# Patient Record
Sex: Female | Born: 1965 | Race: Black or African American | Hispanic: No | Marital: Single | Smoking: Current every day smoker
Health system: Southern US, Community
[De-identification: ages and names within clinical notes are randomized; demographics above are authoritative.]

## PROBLEM LIST (undated history)

## (undated) DIAGNOSIS — I10 Essential (primary) hypertension: Secondary | ICD-10-CM

## (undated) DIAGNOSIS — N189 Chronic kidney disease, unspecified: Secondary | ICD-10-CM

## (undated) DIAGNOSIS — D649 Anemia, unspecified: Secondary | ICD-10-CM

## (undated) HISTORY — PX: NO PAST SURGERIES: SHX2092

---

## 1998-01-28 ENCOUNTER — Emergency Department (HOSPITAL_COMMUNITY): Admission: EM | Admit: 1998-01-28 | Discharge: 1998-01-28 | Payer: Self-pay | Admitting: Emergency Medicine

## 1998-03-09 ENCOUNTER — Emergency Department (HOSPITAL_COMMUNITY): Admission: EM | Admit: 1998-03-09 | Discharge: 1998-03-09 | Payer: Self-pay | Admitting: Emergency Medicine

## 1999-08-24 ENCOUNTER — Other Ambulatory Visit: Admission: RE | Admit: 1999-08-24 | Discharge: 1999-08-24 | Payer: Self-pay | Admitting: Family Medicine

## 2000-09-07 ENCOUNTER — Other Ambulatory Visit: Admission: RE | Admit: 2000-09-07 | Discharge: 2000-09-07 | Payer: Self-pay | Admitting: Family Medicine

## 2001-11-22 ENCOUNTER — Other Ambulatory Visit: Admission: RE | Admit: 2001-11-22 | Discharge: 2001-11-22 | Payer: Self-pay | Admitting: Family Medicine

## 2002-10-07 ENCOUNTER — Emergency Department (HOSPITAL_COMMUNITY): Admission: EM | Admit: 2002-10-07 | Discharge: 2002-10-07 | Payer: Self-pay | Admitting: Emergency Medicine

## 2002-11-27 ENCOUNTER — Emergency Department (HOSPITAL_COMMUNITY): Admission: EM | Admit: 2002-11-27 | Discharge: 2002-11-27 | Payer: Self-pay | Admitting: Emergency Medicine

## 2002-12-05 ENCOUNTER — Other Ambulatory Visit: Admission: RE | Admit: 2002-12-05 | Discharge: 2002-12-05 | Payer: Self-pay | Admitting: Family Medicine

## 2004-05-13 ENCOUNTER — Other Ambulatory Visit: Admission: RE | Admit: 2004-05-13 | Discharge: 2004-05-13 | Payer: Self-pay | Admitting: Physician Assistant

## 2005-08-01 ENCOUNTER — Other Ambulatory Visit: Admission: RE | Admit: 2005-08-01 | Discharge: 2005-08-01 | Payer: Self-pay | Admitting: Family Medicine

## 2005-10-10 ENCOUNTER — Other Ambulatory Visit: Admission: RE | Admit: 2005-10-10 | Discharge: 2005-10-10 | Payer: Self-pay | Admitting: Family Medicine

## 2005-12-29 ENCOUNTER — Emergency Department (HOSPITAL_COMMUNITY): Admission: EM | Admit: 2005-12-29 | Discharge: 2005-12-29 | Payer: Self-pay | Admitting: Emergency Medicine

## 2008-01-23 ENCOUNTER — Other Ambulatory Visit: Admission: RE | Admit: 2008-01-23 | Discharge: 2008-01-23 | Payer: Self-pay | Admitting: Family Medicine

## 2011-05-30 ENCOUNTER — Other Ambulatory Visit (HOSPITAL_COMMUNITY)
Admission: RE | Admit: 2011-05-30 | Discharge: 2011-05-30 | Disposition: A | Discharge status: Home / Self Care | Payer: 59 | Source: Ambulatory Visit | Attending: Family Medicine | Admitting: Family Medicine

## 2011-05-30 ENCOUNTER — Other Ambulatory Visit: Payer: Self-pay | Admitting: Family Medicine

## 2011-05-30 DIAGNOSIS — Z124 Encounter for screening for malignant neoplasm of cervix: Secondary | ICD-10-CM | POA: Insufficient documentation

## 2011-05-30 DIAGNOSIS — Z1159 Encounter for screening for other viral diseases: Secondary | ICD-10-CM | POA: Insufficient documentation

## 2012-02-04 ENCOUNTER — Emergency Department (HOSPITAL_COMMUNITY)
Admission: EM | Admit: 2012-02-04 | Discharge: 2012-02-05 | Disposition: A | Discharge status: Home / Self Care | Payer: BC Managed Care – PPO | Attending: Emergency Medicine | Admitting: Emergency Medicine

## 2012-02-04 ENCOUNTER — Encounter (HOSPITAL_COMMUNITY): Payer: Self-pay

## 2012-02-04 DIAGNOSIS — S0100XA Unspecified open wound of scalp, initial encounter: Secondary | ICD-10-CM | POA: Insufficient documentation

## 2012-02-04 DIAGNOSIS — Y92009 Unspecified place in unspecified non-institutional (private) residence as the place of occurrence of the external cause: Secondary | ICD-10-CM | POA: Insufficient documentation

## 2012-02-04 DIAGNOSIS — W1809XA Striking against other object with subsequent fall, initial encounter: Secondary | ICD-10-CM | POA: Insufficient documentation

## 2012-02-04 DIAGNOSIS — S0191XA Laceration without foreign body of unspecified part of head, initial encounter: Secondary | ICD-10-CM

## 2012-02-04 DIAGNOSIS — I10 Essential (primary) hypertension: Secondary | ICD-10-CM | POA: Insufficient documentation

## 2012-02-04 HISTORY — DX: Essential (primary) hypertension: I10

## 2012-02-04 MED ORDER — LIDOCAINE-EPINEPHRINE (PF) 2 %-1:200000 IJ SOLN
10.0000 mL | Freq: Once | INTRAMUSCULAR | Status: AC
  Administered 2012-02-04: 10 mL

## 2012-02-04 MED ORDER — DICLOFENAC SODIUM 75 MG PO TBEC: 75.0000 mg | DELAYED_RELEASE_TABLET | Freq: Two times a day (BID) | ORAL | Status: AC

## 2012-02-04 NOTE — ED Notes (Signed)
Per EMS pt was "hoarse playing in hotel room and struck head on night stand" No LOC noted. Bleeding controlled before ems arrival. Pt alert x3.

## 2012-02-04 NOTE — ED Provider Notes (Signed)
History     CSN: [id number]  Arrival date & time 02/04/12  2042   First MD Initiated Contact with Patient 02/04/12 2205     10:28 PM HPI Reports she was playing around at home when she fell back and hit her head on a table. States she has a large bleeding laceration on her head. Denies LOC, numbness, tingling, weakness, AMS. Spouse reports she is behaving normally. Denies ETOH, drug abuse, neck pain, visual change dizziness, n/v.  Patient is a 46 y.o. female presenting with scalp laceration. The history is provided by the patient.  Head Laceration This is a new problem. The current episode started today. The problem has been unchanged. Pertinent negatives include no headaches, nausea, neck pain, numbness, vomiting or weakness. Exacerbated by: palpation. Treatments tried: pressure. The treatment provided mild relief.    Past Medical History  Diagnosis Date  . Hypertension     No past surgical history on file.  Family History  Problem Relation Age of Onset  . Diabetes Mother     History  Substance Use Topics  . Smoking status: Current Everyday Smoker  . Smokeless tobacco: Not on file  . Alcohol Use: Yes     1 every six months    OB History    Grav Para Term Preterm Abortions TAB SAB Ect Mult Living   1 1              Review of Systems  HENT: Negative for neck pain.   Gastrointestinal: Negative for nausea and vomiting.  Skin:       Laceration  Neurological: Negative for dizziness, weakness, light-headedness, numbness and headaches.  All other systems reviewed and are negative.    Allergies  Review of patient's allergies indicates no known allergies.  Home Medications  No current outpatient prescriptions on file.  BP 160/100  Pulse 94  Temp(Src) 98.8 F (37.1 C) (Oral)  Resp 18  Ht 5' (1.524 m)  Wt 197 lb (89.359 kg)  BMI 38.47 kg/m2  SpO2 100%  LMP 02/01/2012  Physical Exam  Vitals reviewed. Constitutional: She is oriented to person, place, and time.  Vital signs are normal. She appears well-developed and well-nourished. No distress.  HENT:  Head: Normocephalic. Head is with laceration.    Eyes: Conjunctivae are normal. Pupils are equal, round, and reactive to light.  Neck: Normal range of motion. Neck supple.  Pulmonary/Chest: Effort normal.  Neurological: She is alert and oriented to person, place, and time. No cranial nerve deficit (tested CN III-XII) or sensory deficit.  Skin: Skin is warm and dry. No rash noted. No erythema. No pallor.       Large 4cm head laceration on posterior scalp  Psychiatric: She has a normal mood and affect. Her behavior is normal.    ED Course  Procedures  LACERATION REPAIR Performed by: Sheliah Mends Authorized by: Sheliah Mends Consent: Verbal consent obtained. Risks and benefits: risks, benefits and alternatives were discussed Consent given by: patient Patient identity confirmed: provided demographic data Prepped and Draped in normal sterile fashion Wound explored  Laceration Location: right posterior scalp   Laceration Length: 4cm  No Foreign Bodies seen or palpated  Anesthesia: local infiltration  Local anesthetic: lidocaine 2% 2 epinephrine  Anesthetic total: 4 ml  Irrigation method: syringe Amount of cleaning: standard  Skin closure: simple  Number of staples 8  Technique: Stapling  Patient tolerance: Patient tolerated the procedure well with no immediate complications.    MDM  Sheliah Mends, PA-C 02/05/12 [phone]
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
## 2012-02-05 NOTE — ED Provider Notes (Signed)
Medical screening examination/treatment/procedure(s) were performed by non-physician practitioner and as supervising physician I was immediately available for consultation/collaboration.  Leota Jacobsen, MD 02/05/12 1044

## 2012-02-05 NOTE — ED Notes (Signed)
Wound well approximated with staples.

## 2012-02-09 ENCOUNTER — Emergency Department (HOSPITAL_COMMUNITY)
Admission: EM | Admit: 2012-02-09 | Discharge: 2012-02-09 | Disposition: A | Discharge status: Home / Self Care | Payer: BC Managed Care – PPO | Attending: Emergency Medicine | Admitting: Emergency Medicine

## 2012-02-09 ENCOUNTER — Encounter (HOSPITAL_COMMUNITY): Payer: Self-pay | Admitting: *Deleted

## 2012-02-09 DIAGNOSIS — I10 Essential (primary) hypertension: Secondary | ICD-10-CM | POA: Insufficient documentation

## 2012-02-09 DIAGNOSIS — Z5189 Encounter for other specified aftercare: Secondary | ICD-10-CM

## 2012-02-09 DIAGNOSIS — Z09 Encounter for follow-up examination after completed treatment for conditions other than malignant neoplasm: Secondary | ICD-10-CM | POA: Insufficient documentation

## 2012-02-09 NOTE — Discharge Instructions (Signed)
Your staples do not appear ready for removal today. Please return in 2-3 days for a recheck. Continue to apply antibacterial ointment to the area daily. Wash the area gently with a cloth. Return sooner if you develop fever, chills, nausea, vomiting, or any other worrisome symptoms.  Staple Wound Closure Your wound has been cleaned and closed with skin staples. Please keep the area around your staples clean and dry. Rest and elevate the injured part until all the pain and swelling are gone.  See your doctor or return here for re-check of wound in 2 days.   See your doctor or return here to have your staples removed as instructed.   Please watch for signs of wound infection:   Unusual redness or swelling around the wound.   Increasing pain or tenderness.   Pus drainage.  See your doctor or return here at once if you think your wound is infected. As the wound heals, you may leave it open to the air and clean it daily with a solution of half water/half peroxide. Do not soak your wound in water for long periods. If your caregiver advised you to do so, apply antibiotic cream or ointment. Cover with a new bandage or dressing.  You might need a tetanus shot now if:  You have no idea when you had the last one.   You have never had a tetanus shot before.   Your wound had dirt in it.   If you need a tetanus shot, and you decide not to get one, there is a rare chance of getting tetanus. Sickness from tetanus can be serious.  If you got a tetanus shot, your arm may swell and get red and warm to the touch at the shot site. This is common and not a problem. Document Released: 09/19/2005 Document Revised: 09/08/2011 Document Reviewed: 03/16/2007 [facility] Patient Information 2012 [place].

## 2012-02-09 NOTE — ED Notes (Signed)
Pt here for staple removal to back of head

## 2012-02-09 NOTE — ED Provider Notes (Signed)
History     CSN: [id number]  Arrival date & time 02/09/12  1449   First MD Initiated Contact with Patient 02/09/12 1553      Chief Complaint  Patient presents with  . Suture / Staple Removal    (Consider location/radiation/quality/duration/timing/severity/associated sxs/prior treatment) Patient is a 46 y.o. female presenting with wound check. The history is provided by the patient.  Wound Check  She was treated in the ED 3 to 5 days ago. Previous treatment in the ED includes laceration repair and wound cleansing or irrigation. Treatments since wound repair include regular soap and water washings and antibiotic ointment use. There has been no drainage from the wound. There is no redness present. There is no swelling present. The pain has no pain.   Pt was seen in ED on night of 5/5 for staple placement to a scalp lac. Has had no fever, chills. No drainage from the area.  Past Medical History  Diagnosis Date  . Hypertension     History reviewed. No pertinent past surgical history.  Family History  Problem Relation Age of Onset  . Diabetes Mother     History  Substance Use Topics  . Smoking status: Current Everyday Smoker -- 0.0 packs/day  . Smokeless tobacco: Not on file  . Alcohol Use: Yes     1 every six months    OB History    Grav Para Term Preterm Abortions TAB SAB Ect Mult Living   1 1              Review of Systems as per HPI  Allergies  Review of patient's allergies indicates no known allergies.  Home Medications   Current Outpatient Rx  Name Route Sig Dispense Refill  . DICLOFENAC SODIUM 75 MG PO TBEC Oral Take 1 tablet (75 mg total) by mouth 2 (two) times daily. 30 tablet 0    BP 140/98  Pulse 106  Temp(Src) 98.8 F (37.1 C) (Oral)  Resp 20  Ht 5' (1.524 m)  Wt 193 lb (87.544 kg)  BMI 37.69 kg/m2  SpO2 94%  LMP 01/25/2012  Physical Exam  Nursing note and vitals reviewed. Constitutional: She appears well-developed and well-nourished. No  distress.  HENT:  Head: Normocephalic.       8 staples in place to posterior scalp. Small amount of serosanguinous drainage noted to lateral aspect of wound. Wound edges approximated but not well healed at this time. No surrounding erythema, edema. Wound nontender to palpation.  Neck: Normal range of motion.  Cardiovascular: Normal rate.   Pulmonary/Chest: Effort normal.  Musculoskeletal: Normal range of motion.  Neurological: She is alert.  Skin: Skin is warm and dry. She is not diaphoretic.  Psychiatric: She has a normal mood and affect.    ED Course  Procedures (including critical care time)  Labs Reviewed - No data to display No results found.   1. Visit for wound check       MDM  Pt presents for wound check - staples do not appear ready for removal yet. Will have her return in 2-3 days for recheck. Instructed to cont wound care measures. Reasons to return earlier discussed.        Abran Richard, [place] 02/09/12 [phone]

## 2012-02-12 NOTE — ED Provider Notes (Signed)
Medical screening examination/treatment/procedure(s) were performed by non-physician practitioner and as supervising physician I was immediately available for consultation/collaboration.  Barbara Cower, MD 02/12/12 1109

## 2012-02-13 ENCOUNTER — Emergency Department (HOSPITAL_COMMUNITY)
Admission: EM | Admit: 2012-02-13 | Discharge: 2012-02-13 | Disposition: A | Discharge status: Home / Self Care | Payer: BC Managed Care – PPO | Source: Home / Self Care | Attending: Emergency Medicine | Admitting: Emergency Medicine

## 2012-02-13 ENCOUNTER — Encounter (HOSPITAL_COMMUNITY): Payer: Self-pay

## 2012-02-13 DIAGNOSIS — Z4802 Encounter for removal of sutures: Secondary | ICD-10-CM

## 2012-02-13 NOTE — ED Notes (Signed)
Pt seen at [facility] ED on 02/04/12 for repair of laceration to post. scalp with staples.  Here today for staple removal.  8 staples intact to rt post scalp- edges approximated, no redness, swelling or drainage noted.  Tetanus less than 5 years.

## 2012-02-13 NOTE — Discharge Instructions (Signed)
Continue the bacitracin and warm compresses. Do not vigorously scrub the wound as it is still in the process of healing. Return if it starts beelding, if you have any signs of infection, or other concerns.

## 2012-02-13 NOTE — ED Provider Notes (Signed)
History     CSN: [id number]  Arrival date & time 02/13/12  1012   First MD Initiated Contact with Patient 02/13/12 1043      Chief Complaint  Patient presents with  . Suture / Staple Removal    (Consider location/radiation/quality/duration/timing/severity/associated sxs/prior treatment) HPI Comments: Patient sustained laceration to the back of her head on 5/4. 8 staples placed. She was seen on 5/9, staples were no thought ready for removal. Patient states they "itch" but otherwise has no complaints  Patient is a 46 y.o. female presenting with suture removal. The history is provided by the patient. No language interpreter was used.  Suture / Staple Removal  The sutures were placed 7 to 10 days ago. Treatments since wound repair include soaks and antibiotic ointment use. There has been no drainage from the wound. There is no redness present. There is no swelling present. The pain has no pain.    Past Medical History  Diagnosis Date  . Hypertension     History reviewed. No pertinent past surgical history.  Family History  Problem Relation Age of Onset  . Diabetes Mother     History  Substance Use Topics  . Smoking status: Current Everyday Smoker -- 0.0 packs/day  . Smokeless tobacco: Not on file  . Alcohol Use: Yes     1 every six months    OB History    Grav Para Term Preterm Abortions TAB SAB Ect Mult Living   1 1              Review of Systems  Allergies  Review of patient's allergies indicates no known allergies.  Home Medications   Current Outpatient Rx  Name Route Sig Dispense Refill  . DICLOFENAC SODIUM 75 MG PO TBEC Oral Take 1 tablet (75 mg total) by mouth 2 (two) times daily. 30 tablet 0    BP 132/92  Pulse 96  Temp(Src) 98.5 F (36.9 C) (Oral)  Resp 20  SpO2 99%  LMP 01/25/2012  Physical Exam  Nursing note and vitals reviewed. Constitutional: She is oriented to person, place, and time. She appears well-developed and well-nourished. No  distress.  HENT:  Head: Normocephalic and atraumatic.         Healing laceration, 8 staples intact. No erythema, tenderness, expressible purulent drainage  Eyes: Conjunctivae and EOM are normal.  Neck: Normal range of motion.  Cardiovascular: Normal rate.   Pulmonary/Chest: Effort normal.  Abdominal: She exhibits no distension.  Musculoskeletal: Normal range of motion.  Neurological: She is alert and oriented to person, place, and time.  Skin: Skin is warm and dry.  Psychiatric: She has a normal mood and affect. Her behavior is normal. Judgment and thought content normal.    ED Course  SUTURE REMOVAL Date/Time: 02/13/2012 11:22 AM Performed by: Cherly Beach Authorized by: Cherly Beach Consent: Verbal consent obtained. Risks and benefits: risks, benefits and alternatives were discussed Consent given by: patient Patient understanding: patient states understanding of the procedure being performed Patient consent: the patient's understanding of the procedure matches consent given Required items: required blood products, implants, devices, and special equipment available Patient identity confirmed: verbally with patient Time out: Immediately prior to procedure a "time out" was called to verify the correct patient, procedure, equipment, support staff and site/side marked as required. Body area: head/neck Location details: scalp Wound Appearance: clean Staples Removed: 8 Patient tolerance: Patient tolerated the procedure well with no immediate complications.   (including critical care time)  Labs Reviewed -  No data to display No results found.   1. Encounter for staple removal       MDM  Previous records reviewed.   Cherly Beach, MD 02/13/12 1122

## 2013-02-10 ENCOUNTER — Emergency Department (HOSPITAL_COMMUNITY): Payer: BC Managed Care – PPO

## 2013-02-10 ENCOUNTER — Encounter (HOSPITAL_COMMUNITY): Payer: Self-pay | Admitting: *Deleted

## 2013-02-10 ENCOUNTER — Inpatient Hospital Stay (HOSPITAL_COMMUNITY)
Admission: EM | Admit: 2013-02-10 | Discharge: 2013-02-12 | DRG: 089 | Disposition: A | Discharge status: Home / Self Care | Payer: BC Managed Care – PPO | Attending: Internal Medicine | Admitting: Internal Medicine

## 2013-02-10 DIAGNOSIS — E876 Hypokalemia: Secondary | ICD-10-CM

## 2013-02-10 DIAGNOSIS — F17200 Nicotine dependence, unspecified, uncomplicated: Secondary | ICD-10-CM | POA: Diagnosis present

## 2013-02-10 DIAGNOSIS — J209 Acute bronchitis, unspecified: Secondary | ICD-10-CM | POA: Diagnosis present

## 2013-02-10 DIAGNOSIS — I10 Essential (primary) hypertension: Secondary | ICD-10-CM | POA: Diagnosis present

## 2013-02-10 DIAGNOSIS — J189 Pneumonia, unspecified organism: Principal | ICD-10-CM | POA: Diagnosis present

## 2013-02-10 DIAGNOSIS — K209 Esophagitis, unspecified without bleeding: Secondary | ICD-10-CM | POA: Diagnosis present

## 2013-02-10 LAB — COMPREHENSIVE METABOLIC PANEL
ALT: 14 U/L (ref 0–35)
Alkaline Phosphatase: 88 U/L (ref 39–117)
CO2: 23 mEq/L (ref 19–32)
Chloride: 100 mEq/L (ref 96–112)
GFR calc Af Amer: >90 mL/min (ref >90)
GFR calc non Af Amer: >90 mL/min (ref >90)
Glucose, Bld: 128 mg/dL — ABNORMAL HIGH (ref 70–99)
Potassium: 3.3 mEq/L — ABNORMAL LOW (ref 3.5–5.1)
Sodium: 136 mEq/L (ref 135–145)

## 2013-02-10 LAB — CBC
HCT: 39.3 % (ref 36.0–46.0)
Hemoglobin: 14.2 g/dL (ref 12.0–15.0)
WBC: 13.9 10*3/uL — ABNORMAL HIGH (ref 4.0–10.5)

## 2013-02-10 LAB — RAPID URINE DRUG SCREEN, HOSP PERFORMED
Barbiturates: NOT DETECTED
Benzodiazepines: NOT DETECTED
Cocaine: NOT DETECTED

## 2013-02-10 IMAGING — CT CT ANGIO CHEST
1 of 3 series · 19 of 32 positions shown · IV contrast (APPLIED)
Comparison: [DATE].

CLINICAL DATA: Short of breath.

CT ANGIOGRAPHY CHEST
TECHNIQUE: Multidetector CT imaging of the chest using the
standard protocol during bolus administration of intravenous
contrast. Multiplanar reconstructed images including MIPs were
obtained and reviewed to evaluate the vascular anatomy.
Contrast: 80mL OMNIPAQUE IOHEXOL 350 MG/ML SOLN

[Series 6: pulm embolism 1.0 b25f st · axial · 0.63mm/px · z∈[-272,-28]mm · 19 of 272 slices shown]
[im 14/272  lung]
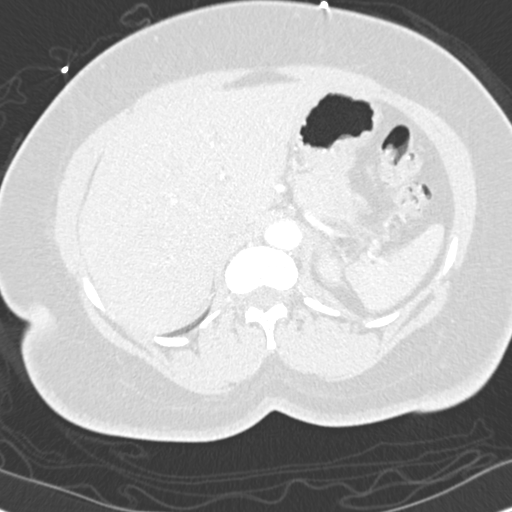
[im 28/272  mediastinal]
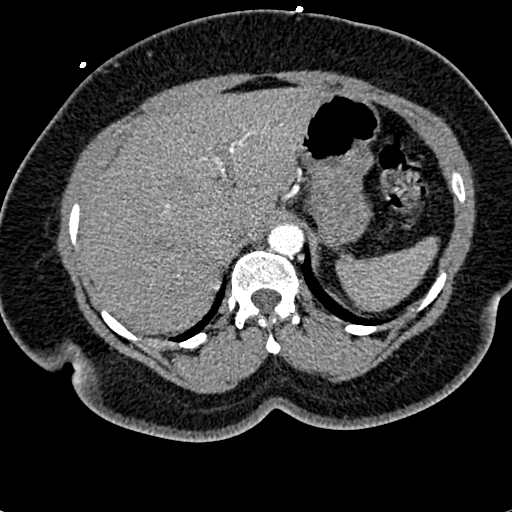
[im 41/272  lung]
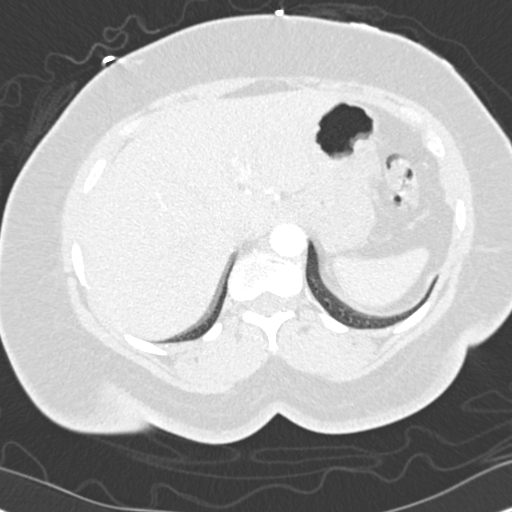
[im 68/272  mediastinal]
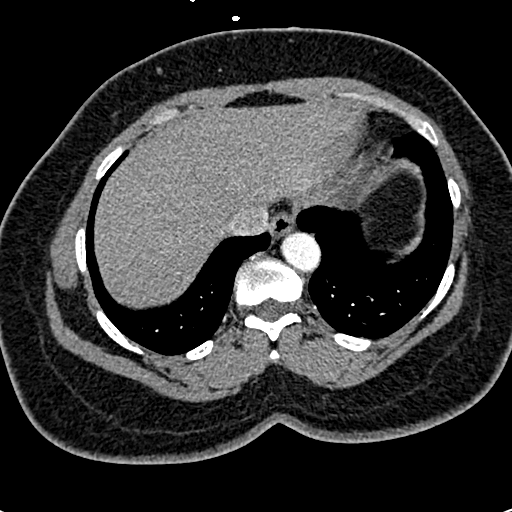
[im 82/272  lung]
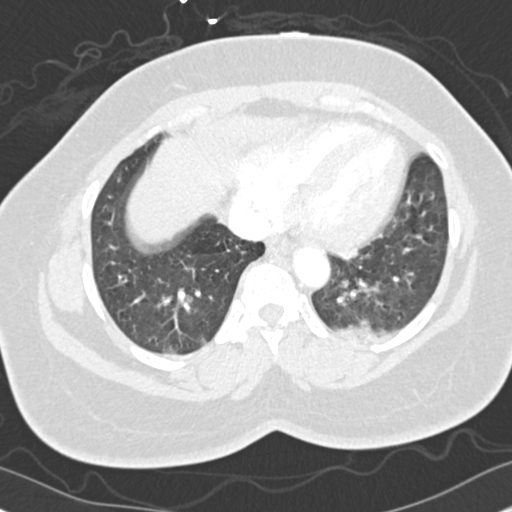
[im 91/272  mediastinal]
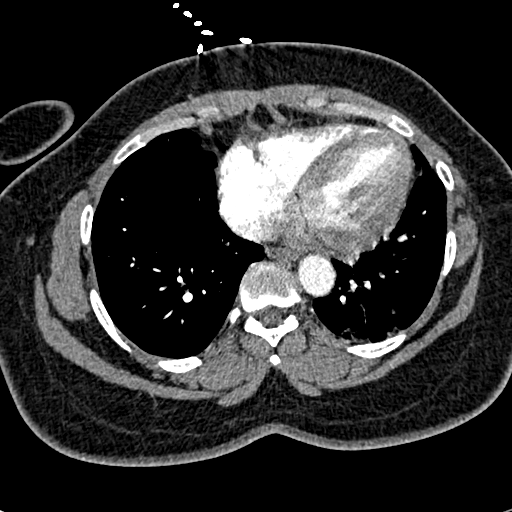
[im 95/272  lung]
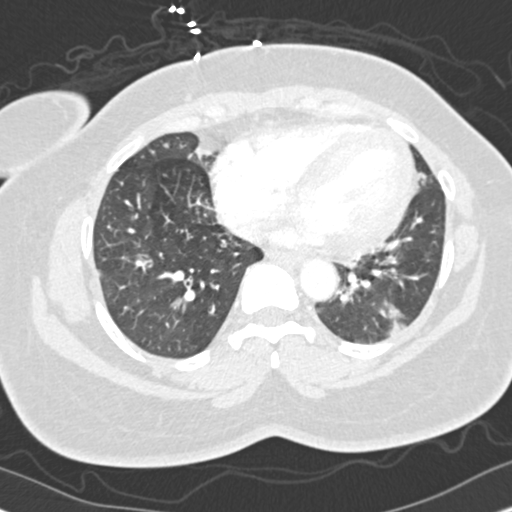
[im 109/272  mediastinal]
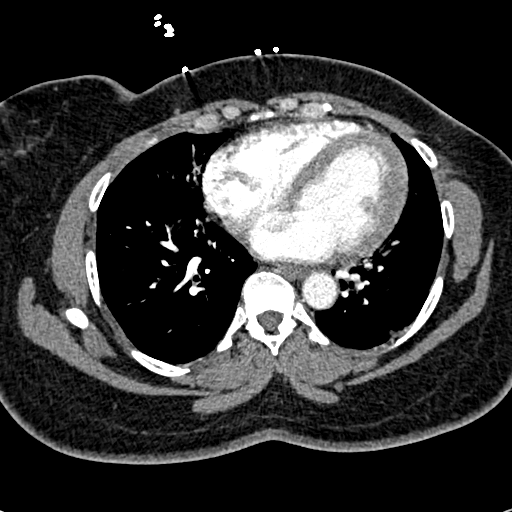
[im 122/272  lung]
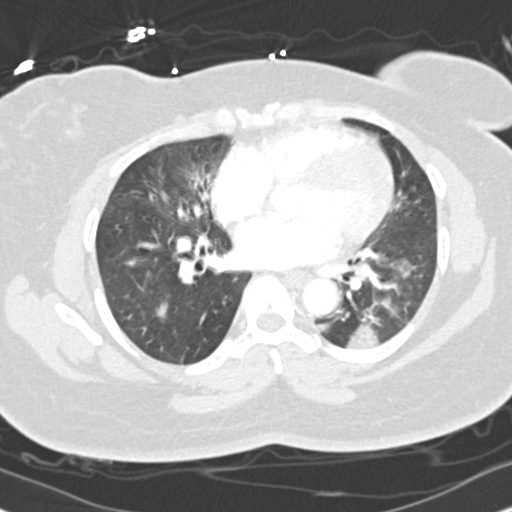
[im 136/272  mediastinal]
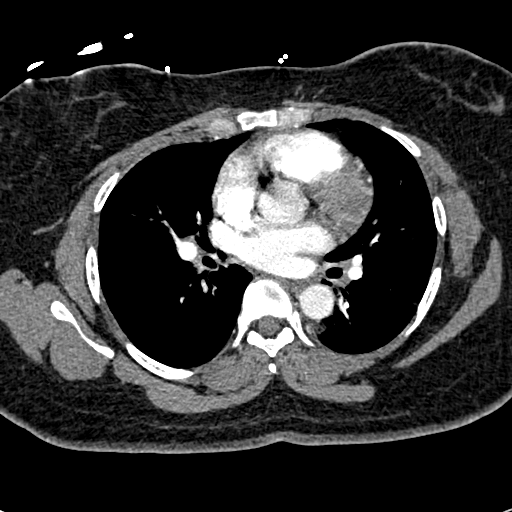
[im 150/272  lung]
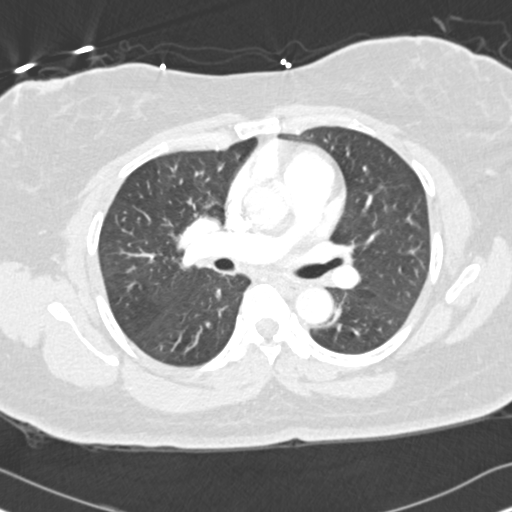
[im 163/272  mediastinal]
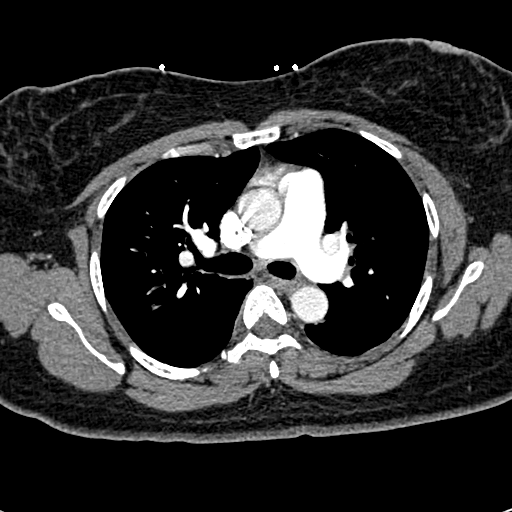
[im 177/272  lung]
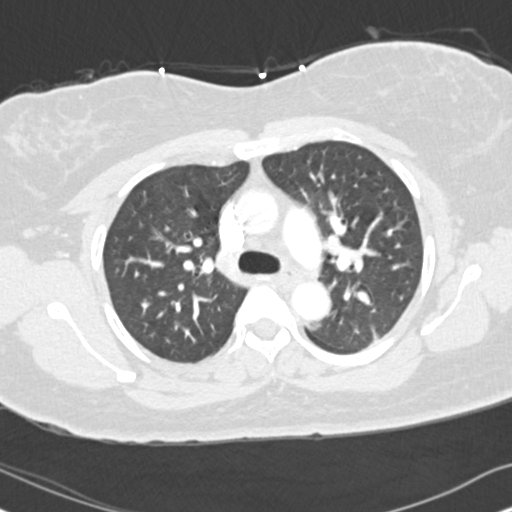
[im 181/272  mediastinal]
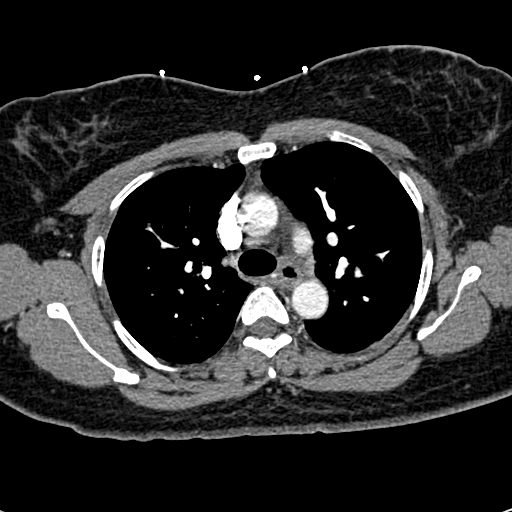
[im 190/272  lung]
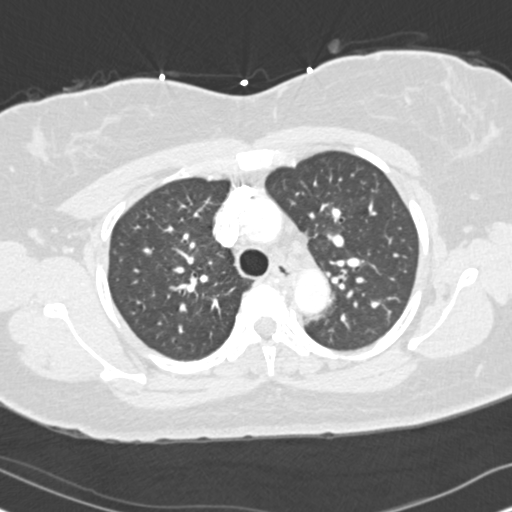
[im 204/272  mediastinal]
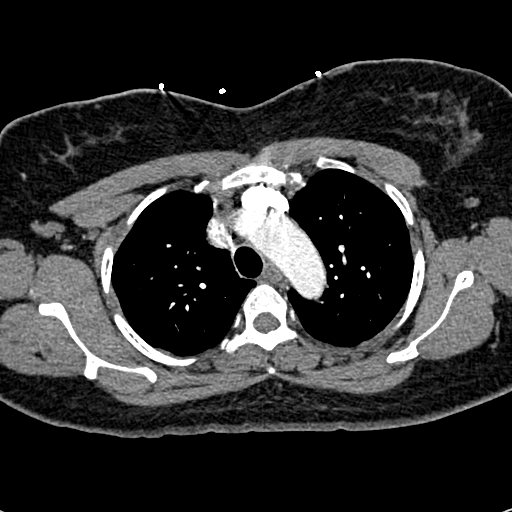
[im 231/272  lung]
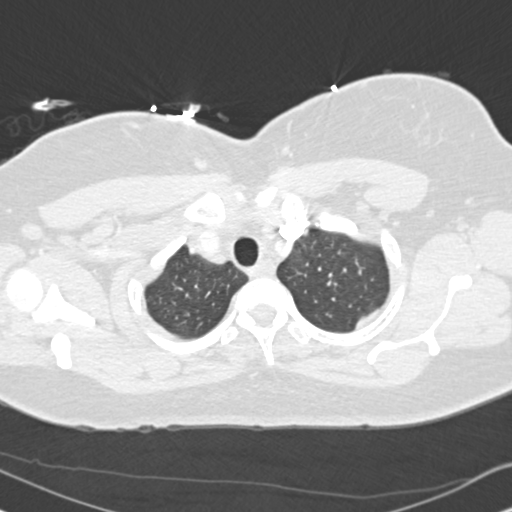
[im 244/272  mediastinal]
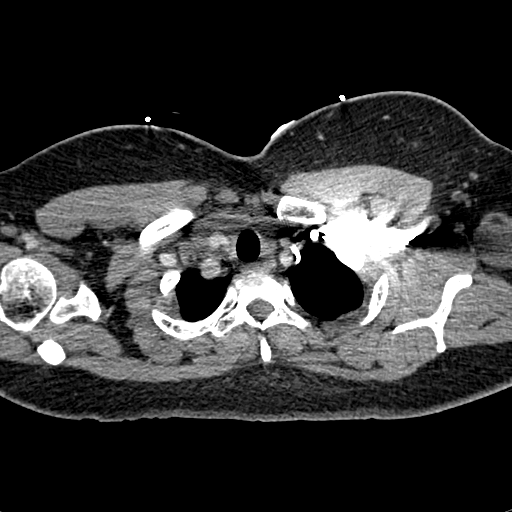
[im 258/272  lung]
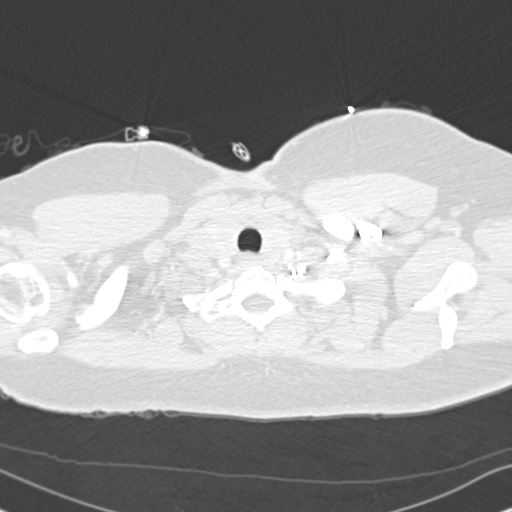

[19 of 32 positions shown; findings below may reference images not displayed]

FINDINGS: Technically adequate study for pulmonary embolism.  No
pulmonary embolus is present.  No axillary adenopathy.  The
thoracic esophagus demonstrates thickening just inferior to the
level of the aortic arch.  This is nonspecific and could be
associated with esophagitis.  Follow-up endoscopy is recommended.

Prominent lymphoid tissue is present in both pulmonary hila.  There
is airspace opacity in the right middle lobe and both lower lobes.
No involvement of the upper lobes.  No convincing evidence of
interlobular septal thickening.  Differential considerations for
airspace disease include multi focal pneumonia or pulmonary edema
primarily.  There is also airspace disease within the lingula.  No
pericardial effusion.  No pleural effusion.  Incidental imaging of
the upper abdomen is within normal limits.  No aggressive osseous
lesions.
IMPRESSION: 1.  Multifocal peripheral airspace opacity compatible with
multifocal pneumonia; pulmonary edema is considered less likely.
2.  Negative for pulmonary embolus or acute aortic abnormality.
3.  Mid thoracic esophageal thickening.  Although nonspecific, this
could be related to esophagitis.  Follow-up endoscopy recommended.

## 2013-02-10 IMAGING — CR DG CHEST 1V PORT
1 series · 1 of 1 positions shown · non-contrast
Comparison: None.

CLINICAL DATA: Short of breath.

PORTABLE CHEST - 1 VIEW

[AP]
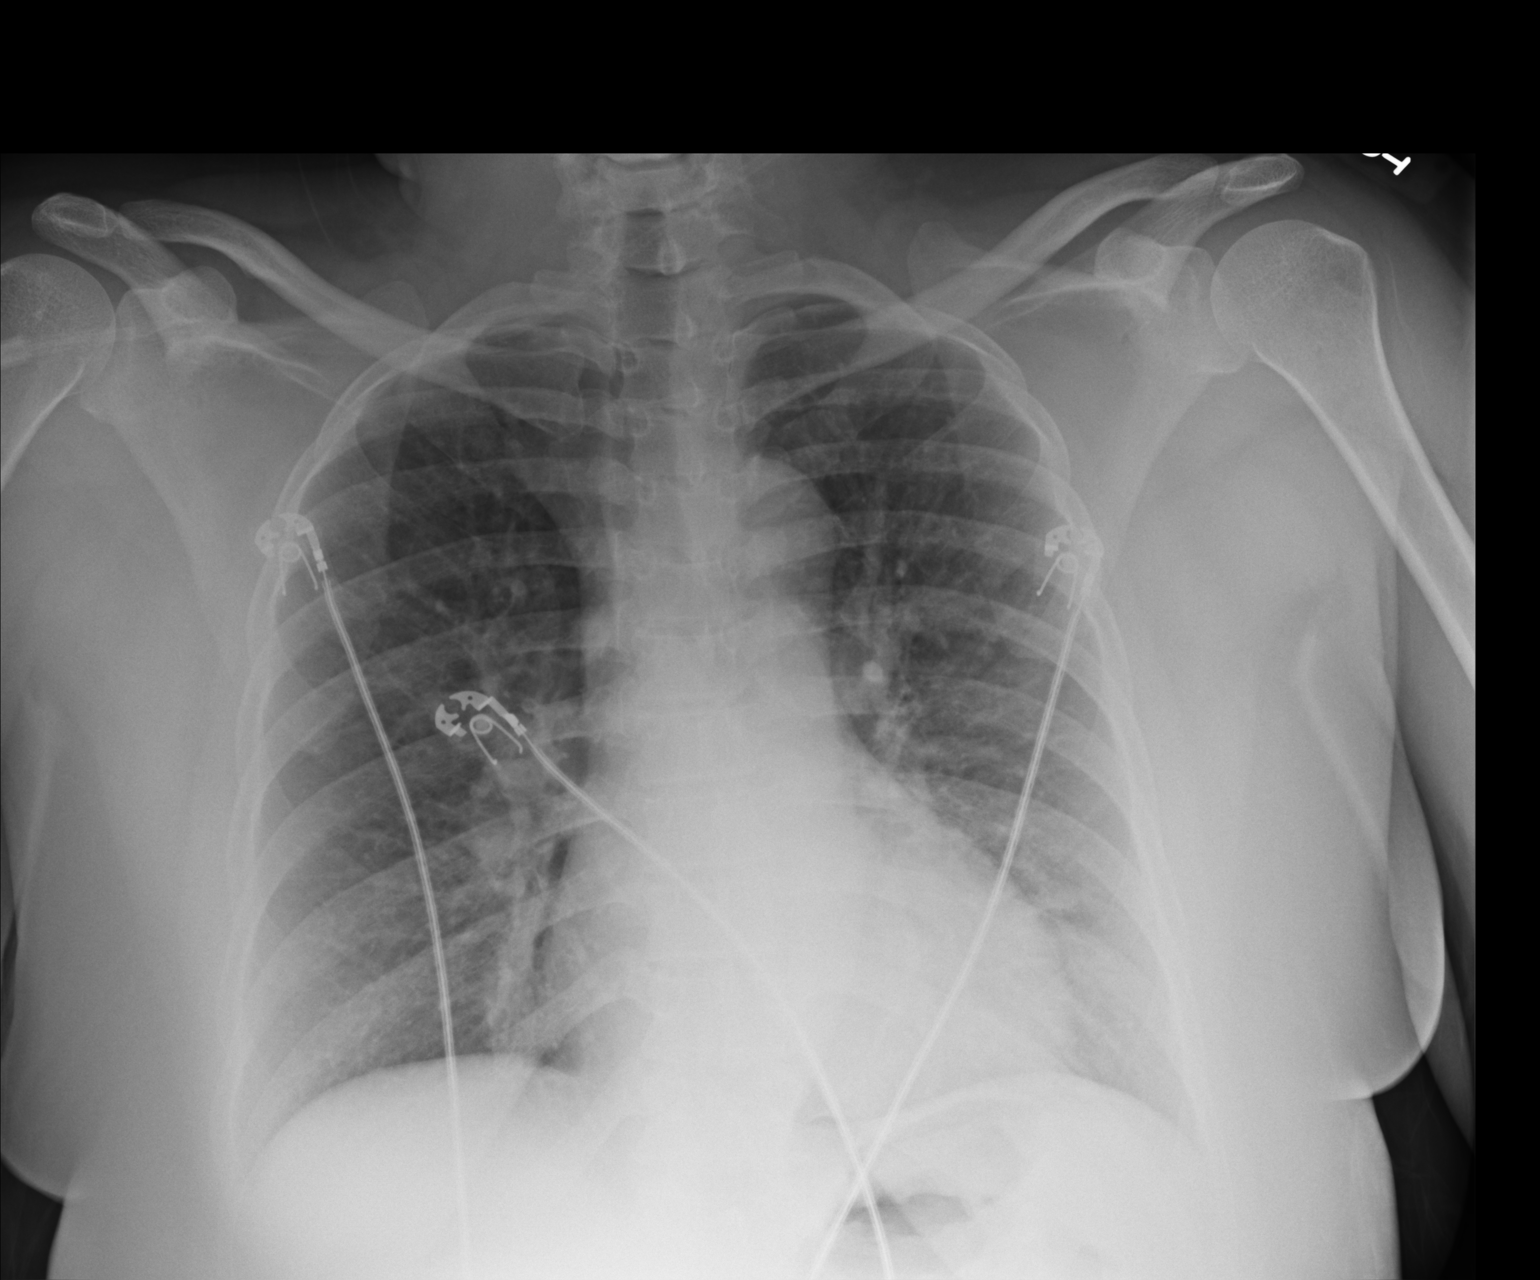

[1 of 1 positions shown; findings below may reference images not displayed]

FINDINGS: Cardiopericardial silhouette within normal limits for
projection.  No airspace disease.  No pleural effusion. Monitoring
leads are projected over the chest.
IMPRESSION: No acute cardiopulmonary disease.

## 2013-02-10 MED ORDER — ENOXAPARIN SODIUM 40 MG/0.4ML ~~LOC~~ SOLN
40.0000 mg | SUBCUTANEOUS | Status: DC
  Administered 2013-02-11 – 2013-02-12 (×2): 40 mg via SUBCUTANEOUS
  Filled 2013-02-10 (×3): qty 0.4

## 2013-02-10 MED ORDER — SODIUM CHLORIDE 0.9 % IJ SOLN
3.0000 mL | Freq: Two times a day (BID) | INTRAMUSCULAR | Status: DC
  Administered 2013-02-10: 3 mL via INTRAVENOUS

## 2013-02-10 MED ORDER — ALBUTEROL SULFATE (5 MG/ML) 0.5% IN NEBU
5.0000 mg | INHALATION_SOLUTION | Freq: Once | RESPIRATORY_TRACT | Status: AC
  Administered 2013-02-10: 5 mg via RESPIRATORY_TRACT
  Filled 2013-02-10: qty 1

## 2013-02-10 MED ORDER — ACETAMINOPHEN 650 MG RE SUPP: 650.0000 mg | Freq: Four times a day (QID) | RECTAL | Status: DC | PRN

## 2013-02-10 MED ORDER — ONDANSETRON HCL 4 MG/2ML IJ SOLN: 4.0000 mg | Freq: Four times a day (QID) | INTRAMUSCULAR | Status: DC | PRN

## 2013-02-10 MED ORDER — ASPIRIN 81 MG PO CHEW
324.0000 mg | CHEWABLE_TABLET | Freq: Once | ORAL | Status: AC
  Administered 2013-02-10: 324 mg via ORAL
  Filled 2013-02-10: qty 4

## 2013-02-10 MED ORDER — PREDNISONE 20 MG PO TABS: 60.0000 mg | ORAL_TABLET | Freq: Once | ORAL | Status: DC

## 2013-02-10 MED ORDER — HYDRALAZINE HCL 20 MG/ML IJ SOLN
10.0000 mg | INTRAMUSCULAR | Status: DC | PRN
  Administered 2013-02-10: 10 mg via INTRAVENOUS
  Filled 2013-02-10: qty 1

## 2013-02-10 MED ORDER — IPRATROPIUM BROMIDE 0.02 % IN SOLN: 0.5000 mg | Freq: Once | RESPIRATORY_TRACT | Status: DC

## 2013-02-10 MED ORDER — IPRATROPIUM BROMIDE 0.02 % IN SOLN
0.5000 mg | Freq: Once | RESPIRATORY_TRACT | Status: AC
  Administered 2013-02-10: 0.5 mg via RESPIRATORY_TRACT
  Filled 2013-02-10: qty 2.5

## 2013-02-10 MED ORDER — ACETAMINOPHEN 325 MG PO TABS
650.0000 mg | ORAL_TABLET | Freq: Four times a day (QID) | ORAL | Status: DC | PRN
  Administered 2013-02-11: 650 mg via ORAL
  Filled 2013-02-10: qty 2

## 2013-02-10 MED ORDER — ALBUTEROL SULFATE (5 MG/ML) 0.5% IN NEBU: 5.0000 mg | INHALATION_SOLUTION | Freq: Once | RESPIRATORY_TRACT | Status: DC

## 2013-02-10 MED ORDER — DEXTROSE 5 % IV SOLN
500.0000 mg | INTRAVENOUS | Status: DC
  Administered 2013-02-11: 500 mg via INTRAVENOUS
  Filled 2013-02-10 (×2): qty 500

## 2013-02-10 MED ORDER — IOHEXOL 350 MG/ML SOLN
80.0000 mL | Freq: Once | INTRAVENOUS | Status: AC | PRN
  Administered 2013-02-10: 80 mL via INTRAVENOUS

## 2013-02-10 MED ORDER — ALBUTEROL SULFATE (5 MG/ML) 0.5% IN NEBU
2.5000 mg | INHALATION_SOLUTION | RESPIRATORY_TRACT | Status: DC | PRN
  Administered 2013-02-10: 2.5 mg via RESPIRATORY_TRACT
  Filled 2013-02-10: qty 0.5

## 2013-02-10 MED ORDER — PREDNISONE 20 MG PO TABS
60.0000 mg | ORAL_TABLET | Freq: Once | ORAL | Status: AC
  Administered 2013-02-10: 60 mg via ORAL
  Filled 2013-02-10: qty 3

## 2013-02-10 MED ORDER — ONDANSETRON HCL 4 MG PO TABS: 4.0000 mg | ORAL_TABLET | Freq: Four times a day (QID) | ORAL | Status: DC | PRN

## 2013-02-10 MED ORDER — SODIUM CHLORIDE 0.9 % IJ SOLN
3.0000 mL | Freq: Two times a day (BID) | INTRAMUSCULAR | Status: DC
  Administered 2013-02-11 – 2013-02-12 (×3): 3 mL via INTRAVENOUS

## 2013-02-10 MED ORDER — DEXTROSE 5 % IV SOLN
1.0000 g | INTRAVENOUS | Status: DC
  Administered 2013-02-11: 1 g via INTRAVENOUS
  Filled 2013-02-10 (×2): qty 10

## 2013-02-10 MED ORDER — DEXTROSE 5 % IV SOLN
1.0000 g | Freq: Once | INTRAVENOUS | Status: AC
  Administered 2013-02-10: 1 g via INTRAVENOUS
  Filled 2013-02-10: qty 10

## 2013-02-10 MED ORDER — AZITHROMYCIN 250 MG PO TABS
500.0000 mg | ORAL_TABLET | Freq: Once | ORAL | Status: AC
  Administered 2013-02-10: 500 mg via ORAL
  Filled 2013-02-10: qty 2

## 2013-02-10 NOTE — ED Provider Notes (Signed)
History     CSN: [id number]  Arrival date & time 02/10/13  1644   First MD Initiated Contact with Patient 02/10/13 1657      Chief Complaint  Patient presents with  . Shortness of Breath    (Consider location/radiation/quality/duration/timing/severity/associated sxs/prior treatment) HPI Comments: Patient presents today with a chief complaint of productive cough and shortness of breath.  She reports that she began having a cough yesterday.  Today she began feeling short of breath and began wheezing.  Symptoms gradually worsening.  She denies prior history of asthma.  No known fever.  She denies chest pain.  She reports that she smokes cigars daily.  She denies any prolonged travel or surgeries in the past 4 weeks.  Denies lower extremity edema or pain.  Denies hemoptysis.  Denies history of Cancer.  Denies history of PE or DVT.  Denies any history of CHF.  She took two puffs of her Cousin's Albuterol inhaler just prior to arrival for the shortness of breath, but did not feel that it helped.  She denies any recent hospitalizations in the past 3 months.    The history is provided by the patient.    Past Medical History  Diagnosis Date  . Hypertension     History reviewed. No pertinent past surgical history.  Family History  Problem Relation Age of Onset  . Diabetes Mother     History  Substance Use Topics  . Smoking status: Current Every Day Smoker -- 0.01 packs/day  . Smokeless tobacco: Not on file  . Alcohol Use: Yes     Comment: 1 every six months    OB History   Grav Para Term Preterm Abortions TAB SAB Ect Mult Living   1 1              Review of Systems  Constitutional: Negative for fever and chills.  Respiratory: Positive for cough, shortness of breath and wheezing.   All other systems reviewed and are negative.    Allergies  Review of patient's allergies indicates no known allergies.  Home Medications   Current Outpatient Rx  Name  Route  Sig  Dispense   Refill  . aspirin-acetaminophen-caffeine (EXCEDRIN MIGRAINE) 250-250-65 MG per tablet   Oral   Take 1 tablet by mouth every 6 (six) hours as needed for pain. For pain         . guaifenesin (ROBITUSSIN) 100 MG/5ML syrup   Oral   Take 200 mg by mouth daily as needed for cough. For cough           BP 176/102  Pulse 112  Temp(Src) 99.5 F (37.5 C) (Oral)  Resp 26  SpO2 97%  LMP 02/10/2013  Physical Exam  Nursing note and vitals reviewed. Constitutional: She appears well-developed and well-nourished.  HENT:  Head: Normocephalic and atraumatic.  Mouth/Throat: Oropharynx is clear and moist.  Neck: Normal range of motion. Neck supple.  Cardiovascular: Normal rate, regular rhythm and normal heart sounds.   Pulmonary/Chest: Tachypnea noted. She has wheezes. She has rhonchi.  Neurological: She is alert.  Skin: Skin is warm and dry.  Psychiatric: She has a normal mood and affect.    ED Course  Procedures (including critical care time)  Labs Reviewed  CBC - Abnormal; Notable for the following:    WBC 13.9 (*)    MCHC 36.1 (*)    All other components within normal limits  COMPREHENSIVE METABOLIC PANEL - Abnormal; Notable for the following:    Potassium  3.3 (*)    Glucose, Bld 128 (*)    All other components within normal limits  D-DIMER, QUANTITATIVE - Abnormal; Notable for the following:    D-Dimer, Quant 0.61 (*)    All other components within normal limits  PRO B NATRIURETIC PEPTIDE  POCT I-STAT TROPONIN I   Ct Angio Chest Pe W/cm &/or Wo Cm  02/10/2013  *RADIOLOGY REPORT*  Clinical Data: Short of breath.  CT ANGIOGRAPHY CHEST  Technique:  Multidetector CT imaging of the chest using the standard protocol during bolus administration of intravenous contrast. Multiplanar reconstructed images including MIPs were obtained and reviewed to evaluate the vascular anatomy.  Contrast: 14mL OMNIPAQUE IOHEXOL 350 MG/ML SOLN  Comparison: 02/10/2013.  Findings: Technically adequate  study for pulmonary embolism.  No pulmonary embolus is present.  No axillary adenopathy.  The thoracic esophagus demonstrates thickening just inferior to the level of the aortic arch.  This is nonspecific and could be associated with esophagitis.  Follow-up endoscopy is recommended.  Prominent lymphoid tissue is present in both pulmonary hila.  There is airspace opacity in the right middle lobe and both lower lobes. No involvement of the upper lobes.  No convincing evidence of interlobular septal thickening.  Differential considerations for airspace disease include multi focal pneumonia or pulmonary edema primarily.  There is also airspace disease within the lingula.  No pericardial effusion.  No pleural effusion.  Incidental imaging of the upper abdomen is within normal limits.  No aggressive osseous lesions.  IMPRESSION: 1.  Multifocal peripheral airspace opacity compatible with multifocal pneumonia; pulmonary edema is considered less likely. 2.  Negative for pulmonary embolus or acute aortic abnormality. 3.  Mid thoracic esophageal thickening.  Although nonspecific, this could be related to esophagitis.  Follow-up endoscopy recommended.   Original Report Authenticated By: Dereck Ligas, M.D.    Dg Chest Port 1 View  02/10/2013  *RADIOLOGY REPORT*  Clinical Data: Short of breath.  PORTABLE CHEST - 1 VIEW  Comparison: None.  Findings: Cardiopericardial silhouette within normal limits for projection.  No airspace disease.  No pleural effusion. Monitoring leads are projected over the chest.  IMPRESSION: No acute cardiopulmonary disease.   Original Report Authenticated By: Dereck Ligas, M.D.      No diagnosis found.    MDM  Patient presenting with cough and shortness of breath.  She was found to be hypoxic and tachycardic upon arrival in the ED.  D-dimer ordered and was found to be elevated.  Therefore, CT angio chest was ordered, which was negative for PE, but did show multifocal pneumonia.  Patient  started on IV Ceftriaxone and also Azithromycin for CAP.  Patient admitted for further management.          Sherlyn Lees [place], PA-C 02/12/13 1349

## 2013-02-10 NOTE — ED Notes (Signed)
Pt c/o SOB starting 2000 last night, pt woke this am with a productive cough with thick white colored sputum, wheezing, and chest tightness. Pt states her chest tightness is d/t the SOB. Pt denies hx of asthma but does report using her cousin's albuterol inhaler with no relief

## 2013-02-10 NOTE — ED Notes (Signed)
Patient transported to CT 

## 2013-02-10 NOTE — H&P (Signed)
[facility] History and Physical  Robin Navarro [id number] DOB: November 14, 1965 DOA: 02/10/2013  Referring physician: ER physician. PCP: Robin Shouts, MD   Chief Complaint: Shortness of breath.  HPI: Robin Navarro is a 47 y.o. female history of hypertension presented to the ER with shortness of breath. Patient's shortness of breath started today morning 5 AM. Shortness of breath was present even at rest. Patient has been having cough since last evening which was mildly productive. Denies any fever chills. Denies any chest pain. Since patient's short of breath and cough was persistent patient came to the ER. CT angiogram of the chest shows multifocal pneumonia and has been admitted for further management. Patient otherwise denies any nausea vomiting abdominal pain fever chills dizziness or loss of consciousness.  Review of Systems: As presented in the history of presenting illness, rest negative.  Past Medical History  Diagnosis Date  . Hypertension    History reviewed. No pertinent past surgical history. Social History:  reports that she has been smoking.  She does not have any smokeless tobacco history on file. She reports that  drinks alcohol. She reports that she uses illicit drugs (Marijuana). Lives at home. where does patient live-- Can do ADLs. Can patient participate in ADLs?  No Known Allergies  Family History  Problem Relation Age of Onset  . Diabetes Mother   . Hypertension Mother      Prior to Admission medications   Medication Sig Start Date End Date Taking? Authorizing Provider  aspirin-acetaminophen-caffeine (EXCEDRIN MIGRAINE) [phone] MG per tablet Take 1 tablet by mouth every 6 (six) hours as needed for pain. For pain   Yes Historical Provider, MD  guaifenesin (ROBITUSSIN) 100 MG/5ML syrup Take 200 mg by mouth daily as needed for cough. For cough   Yes Historical Provider, MD   Physical Exam: Filed Vitals:   02/10/13 1742 02/10/13 1935  02/10/13 2000 02/10/13 2030  BP: 176/102 160/109 164/110 158/120  Pulse: 112 103 93 99  Temp:      TempSrc:      Resp: 26 27 32 24  SpO2: 97% 96% 96% 97%     General:  Well-developed well-nourished.  Eyes: Anicteric no pallor.  ENT:  no discharge from the ears eyes nose or mouth.  Neck:  no mass felt.  Cardiovascular:  S1-S2 heard.  Respiratory:  no rhonchi or crepitations.  Abdomen:  soft nontender bowel sounds present.  Skin:  no rash.  Musculoskeletal:  no edema.  Psychiatric:  appears normal.  Neurologic:  alert awake oriented to time place and person. Moves all extremities.  Labs on Admission:  Basic Metabolic Panel:  Recent Labs Lab 02/10/13 1710  NA 136  K 3.3*  CL 100  CO2 23  GLUCOSE 128*  BUN 6  CREATININE 0.65  CALCIUM 9.3   Liver Function Tests:  Recent Labs Lab 02/10/13 1710  AST 20  ALT 14  ALKPHOS 88  BILITOT 0.4  PROT 8.0  ALBUMIN 3.9   No results found for this basename: LIPASE, AMYLASE,  in the last 168 hours No results found for this basename: AMMONIA,  in the last 168 hours CBC:  Recent Labs Lab 02/10/13 1710  WBC 13.9*  HGB 14.2  HCT 39.3  MCV 85.6  PLT 284   Cardiac Enzymes: No results found for this basename: CKTOTAL, CKMB, CKMBINDEX, TROPONINI,  in the last 168 hours  BNP (last 3 results)  Recent Labs  02/10/13 1710  PROBNP 49.1   CBG: No results found for  this basename: GLUCAP,  in the last 168 hours  Radiological Exams on Admission: Ct Angio Chest Pe W/cm &/or Wo Cm  02/10/2013  *RADIOLOGY REPORT*  Clinical Data: Short of breath.  CT ANGIOGRAPHY CHEST  Technique:  Multidetector CT imaging of the chest using the standard protocol during bolus administration of intravenous contrast. Multiplanar reconstructed images including MIPs were obtained and reviewed to evaluate the vascular anatomy.  Contrast: 9mL OMNIPAQUE IOHEXOL 350 MG/ML SOLN  Comparison: 02/10/2013.  Findings: Technically adequate study for  pulmonary embolism.  No pulmonary embolus is present.  No axillary adenopathy.  The thoracic esophagus demonstrates thickening just inferior to the level of the aortic arch.  This is nonspecific and could be associated with esophagitis.  Follow-up endoscopy is recommended.  Prominent lymphoid tissue is present in both pulmonary hila.  There is airspace opacity in the right middle lobe and both lower lobes. No involvement of the upper lobes.  No convincing evidence of interlobular septal thickening.  Differential considerations for airspace disease include multi focal pneumonia or pulmonary edema primarily.  There is also airspace disease within the lingula.  No pericardial effusion.  No pleural effusion.  Incidental imaging of the upper abdomen is within normal limits.  No aggressive osseous lesions.  IMPRESSION: 1.  Multifocal peripheral airspace opacity compatible with multifocal pneumonia; pulmonary edema is considered less likely. 2.  Negative for pulmonary embolus or acute aortic abnormality. 3.  Mid thoracic esophageal thickening.  Although nonspecific, this could be related to esophagitis.  Follow-up endoscopy recommended.   Original Report Authenticated By: Dereck Ligas, M.D.    Dg Chest Port 1 View  02/10/2013  *RADIOLOGY REPORT*  Clinical Data: Short of breath.  PORTABLE CHEST - 1 VIEW  Comparison: None.  Findings: Cardiopericardial silhouette within normal limits for projection.  No airspace disease.  No pleural effusion. Monitoring leads are projected over the chest.  IMPRESSION: No acute cardiopulmonary disease.   Original Report Authenticated By: Dereck Ligas, M.D.      Assessment/Plan Principal Problem:   Community acquired pneumonia Active Problems:   HTN (hypertension)   1.     Community acquired pneumonia - patient has been started on ceftriaxone and Zithromax which will be continued. Check for HIV and influenza. Check urine strep antigen and Legionella antigen. 2.      Hypertension - patient states she has not been taking her antihypertensives recently. For now I have placed patient on when necessary IV hydralazine for systolic blood pressure more than 160. Closely follow blood pressure trends. In a.m. if we are able to find patient's home antihypertensives then continue the same.    Code Status:  full code.  Family Communication:  none.  Disposition Plan:  admit to inpatient.    Clema Skousen N. [facility] Pager [phone].  If 7PM-7AM, please contact night-coverage [email or web address] Password [facility] 02/10/2013, 8:50 PM

## 2013-02-10 NOTE — ED Notes (Signed)
Walking with pt O2 dropped to 94%.

## 2013-02-10 NOTE — ED Notes (Signed)
Pt is here with sob since 0500 today.  No history of any heart failure or lung problems.

## 2013-02-11 DIAGNOSIS — J209 Acute bronchitis, unspecified: Secondary | ICD-10-CM

## 2013-02-11 LAB — BASIC METABOLIC PANEL
BUN: 9 mg/dL (ref 6–23)
Chloride: 103 mEq/L (ref 96–112)
GFR calc Af Amer: >90 mL/min (ref >90)
Glucose, Bld: 118 mg/dL — ABNORMAL HIGH (ref 70–99)
Potassium: 3.2 mEq/L — ABNORMAL LOW (ref 3.5–5.1)

## 2013-02-11 LAB — CBC
HCT: 37.3 % (ref 36.0–46.0)
Hemoglobin: 13.2 g/dL (ref 12.0–15.0)
MCHC: 35.4 g/dL (ref 30.0–36.0)

## 2013-02-11 LAB — INFLUENZA PANEL BY PCR (TYPE A & B): Influenza B By PCR: NEGATIVE

## 2013-02-11 MED ORDER — IPRATROPIUM BROMIDE 0.02 % IN SOLN
0.5000 mg | Freq: Four times a day (QID) | RESPIRATORY_TRACT | Status: DC
  Administered 2013-02-11 – 2013-02-12 (×4): 0.5 mg via RESPIRATORY_TRACT
  Filled 2013-02-11 (×6): qty 2.5

## 2013-02-11 MED ORDER — NICOTINE 14 MG/24HR TD PT24
14.0000 mg | MEDICATED_PATCH | Freq: Every day | TRANSDERMAL | Status: DC
  Filled 2013-02-11 (×2): qty 1

## 2013-02-11 MED ORDER — GUAIFENESIN ER 600 MG PO TB12
1200.0000 mg | ORAL_TABLET | Freq: Two times a day (BID) | ORAL | Status: DC
  Administered 2013-02-11 – 2013-02-12 (×3): 1200 mg via ORAL
  Filled 2013-02-11 (×4): qty 2

## 2013-02-11 MED ORDER — POTASSIUM CHLORIDE CRYS ER 20 MEQ PO TBCR
40.0000 meq | EXTENDED_RELEASE_TABLET | Freq: Once | ORAL | Status: AC
  Administered 2013-02-11: 40 meq via ORAL
  Filled 2013-02-11: qty 2

## 2013-02-11 MED ORDER — BENZONATATE 100 MG PO CAPS
100.0000 mg | ORAL_CAPSULE | Freq: Three times a day (TID) | ORAL | Status: DC | PRN
  Filled 2013-02-11: qty 1

## 2013-02-11 MED ORDER — ALBUTEROL SULFATE (5 MG/ML) 0.5% IN NEBU
2.5000 mg | INHALATION_SOLUTION | Freq: Four times a day (QID) | RESPIRATORY_TRACT | Status: DC
  Administered 2013-02-11 – 2013-02-12 (×4): 2.5 mg via RESPIRATORY_TRACT
  Filled 2013-02-11 (×6): qty 0.5

## 2013-02-11 MED ORDER — PANTOPRAZOLE SODIUM 40 MG PO TBEC
40.0000 mg | DELAYED_RELEASE_TABLET | Freq: Every day | ORAL | Status: DC
  Administered 2013-02-11 – 2013-02-12 (×2): 40 mg via ORAL
  Filled 2013-02-11 (×2): qty 1

## 2013-02-11 MED ORDER — HYDROCHLOROTHIAZIDE 25 MG PO TABS
25.0000 mg | ORAL_TABLET | Freq: Every day | ORAL | Status: DC
  Administered 2013-02-11 – 2013-02-12 (×2): 25 mg via ORAL
  Filled 2013-02-11 (×2): qty 1

## 2013-02-11 NOTE — Progress Notes (Signed)
Patient ID: Robin Navarro  female  [id number]    DOB: 09-Oct-1965    DOA: 02/10/2013  PCP: Hulen Shouts, MD  Assessment/Plan: Principal Problem:   Community acquired pneumonia- CT chest showed multifocal pneumonia - Continue her Zithromax and Rocephin, follow cultures -Continue nebs O2   Active Problems:   HTN (hypertension): Continue hydrochlorothiazide    Acute bronchitis: - Placed on nebs albuterol and Atrovent  - Patient counseled to quit smoking, placed on nicotine patch  - Outpatient PFTs once recovered to r/o asthma/COPD  Esophagitis - Placed on PPI, outpatient EGD once pulmonary issues are resolved  Hypokalemia: Replaced  DVT Prophylaxis:  Code Status: Full Code  Disposition:  Subjective: Still SOB and somewhat wheezy, coughing  Objective: Weight change:  No intake or output data in the 24 hours ending 02/11/13 1002 Blood pressure 138/88, pulse 94, temperature 98.5 F (36.9 C), temperature source Oral, resp. rate 20, height 5' (1.524 m), weight 87.816 kg (193 lb 9.6 oz), last menstrual period 02/10/2013, SpO2 96.00%.  Physical Exam: General: Alert and awake, oriented x3, not in any acute distress. CVS: S1-S2 clear, no murmur rubs or gallops Chest:  bilateral rhonchi with expiratory wheezing  Abdomen: soft nontender, nondistended, normal bowel sounds Extremities: no cyanosis, clubbing or edema noted bilaterally Neuro: Cranial nerves II-XII intact, no focal neurological deficits  Lab Results: Basic Metabolic Panel:  Recent Labs Lab 02/10/13 1710 02/11/13 0518  NA 136 138  K 3.3* 3.2*  CL 100 103  CO2 23 24  GLUCOSE 128* 118*  BUN 6 9  CREATININE 0.65 0.76  CALCIUM 9.3 9.6   Liver Function Tests:  Recent Labs Lab 02/10/13 1710  AST 20  ALT 14  ALKPHOS 88  BILITOT 0.4  PROT 8.0  ALBUMIN 3.9   No results found for this basename: LIPASE, AMYLASE,  in the last 168 hours No results found for this basename: AMMONIA,  in the last 168  hours CBC:  Recent Labs Lab 02/10/13 1710 02/11/13 0518  WBC 13.9* 17.5*  HGB 14.2 13.2  HCT 39.3 37.3  MCV 85.6 85.2  PLT 284 267    Studies/Results: Ct Angio Chest Pe W/cm &/or Wo Cm  02/10/2013   IMPRESSION: 1.  Multifocal peripheral airspace opacity compatible with multifocal pneumonia; pulmonary edema is considered less likely. 2.  Negative for pulmonary embolus or acute aortic abnormality. 3.  Mid thoracic esophageal thickening.  Although nonspecific, this could be related to esophagitis.  Follow-up endoscopy recommended.   Original Report Authenticated By: Dereck Ligas, M.D.    Dg Chest Port 1 View  02/10/2013  *RADIOLOGY REPORT*  Clinical Data: Short of breath.  PORTABLE CHEST - 1 VIEW  Comparison: None.  Findings: Cardiopericardial silhouette within normal limits for projection.  No airspace disease.  No pleural effusion. Monitoring leads are projected over the chest.  IMPRESSION: No acute cardiopulmonary disease.   Original Report Authenticated By: Dereck Ligas, M.D.     Medications: Scheduled Meds: . albuterol  2.5 mg Nebulization Q6H   And  . ipratropium  0.5 mg Nebulization Q6H  . azithromycin  500 mg Intravenous Q24H  . cefTRIAXone (ROCEPHIN)  IV  1 g Intravenous Q24H  . enoxaparin (LOVENOX) injection  40 mg Subcutaneous Q24H  . guaiFENesin  1,200 mg Oral BID  . hydrochlorothiazide  25 mg Oral Daily  . pantoprazole  40 mg Oral Q0600  . potassium chloride  40 mEq Oral Once  . sodium chloride  3 mL Intravenous Q12H  . sodium chloride  3 mL Intravenous Q12H      LOS: 1 day   Latrelle Fuston M.D. [facility] 02/11/2013, 10:02 AM Pager: [phone]  If 7PM-7AM, please contact night-coverage [email or web address] Password [email or web address]

## 2013-02-12 DIAGNOSIS — E876 Hypokalemia: Secondary | ICD-10-CM

## 2013-02-12 LAB — BASIC METABOLIC PANEL
BUN: 12 mg/dL (ref 6–23)
Chloride: 102 mEq/L (ref 96–112)
GFR calc Af Amer: 79 mL/min — ABNORMAL LOW (ref >90)
Potassium: 3.4 mEq/L — ABNORMAL LOW (ref 3.5–5.1)
Sodium: 138 mEq/L (ref 135–145)

## 2013-02-12 LAB — CBC
HCT: 36 % (ref 36.0–46.0)
RDW: 15.2 % (ref 11.5–15.5)
WBC: 9.3 10*3/uL (ref 4.0–10.5)

## 2013-02-12 LAB — LEGIONELLA ANTIGEN, URINE

## 2013-02-12 MED ORDER — BENZONATATE 100 MG PO CAPS: 100.0000 mg | ORAL_CAPSULE | Freq: Three times a day (TID) | ORAL | Status: DC | PRN

## 2013-02-12 MED ORDER — NICOTINE 14 MG/24HR TD PT24: 1.0000 | MEDICATED_PATCH | TRANSDERMAL | Status: DC

## 2013-02-12 MED ORDER — PANTOPRAZOLE SODIUM 40 MG PO TBEC: 40.0000 mg | DELAYED_RELEASE_TABLET | Freq: Every day | ORAL | Status: DC

## 2013-02-12 MED ORDER — HYDROCHLOROTHIAZIDE 25 MG PO TABS: 25.0000 mg | ORAL_TABLET | Freq: Every day | ORAL | Status: DC

## 2013-02-12 MED ORDER — AZITHROMYCIN 600 MG PO TABS
600.0000 mg | ORAL_TABLET | Freq: Every day | ORAL | Status: DC
  Filled 2013-02-12: qty 1

## 2013-02-12 MED ORDER — GUAIFENESIN ER 600 MG PO TB12: 1200.0000 mg | ORAL_TABLET | Freq: Two times a day (BID) | ORAL | Status: DC

## 2013-02-12 MED ORDER — POTASSIUM CHLORIDE CRYS ER 20 MEQ PO TBCR
40.0000 meq | EXTENDED_RELEASE_TABLET | Freq: Once | ORAL | Status: AC
  Administered 2013-02-12: 40 meq via ORAL
  Filled 2013-02-12: qty 2

## 2013-02-12 MED ORDER — POTASSIUM CHLORIDE ER 10 MEQ PO TBCR: 10.0000 meq | EXTENDED_RELEASE_TABLET | Freq: Every day | ORAL | Status: DC

## 2013-02-12 MED ORDER — AZITHROMYCIN 600 MG PO TABS: 600.0000 mg | ORAL_TABLET | Freq: Every day | ORAL | Status: DC

## 2013-02-12 MED ORDER — CEFUROXIME AXETIL 500 MG PO TABS: 500.0000 mg | ORAL_TABLET | Freq: Two times a day (BID) | ORAL | Status: DC

## 2013-02-12 MED ORDER — ALBUTEROL SULFATE HFA 108 (90 BASE) MCG/ACT IN AERS: 2.0000 | INHALATION_SPRAY | Freq: Four times a day (QID) | RESPIRATORY_TRACT | Status: DC | PRN

## 2013-02-12 NOTE — ED Provider Notes (Signed)
Medical screening examination/treatment/procedure(s) were conducted as a shared visit with non-physician practitioner(s) and myself.  I personally evaluated the patient during the encounter   Julianne Rice, MD 02/12/13 1536

## 2013-02-12 NOTE — Progress Notes (Signed)
Reviewed discharge instructions with patient and she stated her understanding.  Patient asking which medications where most important to get today, and she was instructed her antibiotics and inhaler.  Patient discharged via wheelchair by volunteers home with family.  Sanda Linger

## 2013-02-12 NOTE — Discharge Summary (Signed)
Physician Discharge Summary  Patient ID: Robin Navarro MRN: [id number] DOB/AGE: 12-23-1965 47 y.o.  Admit date: 02/10/2013 Discharge date: 02/12/2013  Primary Care Physician:  Hulen Shouts, MD  Discharge Diagnoses:    . Community acquired pneumonia . HTN (hypertension) . Acute bronchitis Hypokalemia Esophagitis   Consults:  None   Recommendations for Outpatient Follow-up:  1) patient is started on HCTZ for hypertension, please check renal function on followup appointment 2) patient is started on PPI, may need an outpatient EGD, esophagitis seen on CT angiogram of the chest 3) should obtain outpatient chest x-ray after 2 weeks to confirm resolution of pneumonia  Allergies:  No Known Allergies   Discharge Medications:   Medication List    TAKE these medications       albuterol 108 (90 BASE) MCG/ACT inhaler  Commonly known as:  PROVENTIL HFA;VENTOLIN HFA  Inhale 2 puffs into the lungs every 6 (six) hours as needed for wheezing.     aspirin-acetaminophen-caffeine [id number] MG per tablet  Commonly known as:  EXCEDRIN MIGRAINE  Take 1 tablet by mouth every 6 (six) hours as needed for pain. For pain     azithromycin 600 MG tablet  Commonly known as:  ZITHROMAX  Take 1 tablet (600 mg total) by mouth daily. X 1 week     benzonatate 100 MG capsule  Commonly known as:  TESSALON  Take 1 capsule (100 mg total) by mouth 3 (three) times daily as needed for cough.     cefUROXime 500 MG tablet  Commonly known as:  CEFTIN  Take 1 tablet (500 mg total) by mouth 2 (two) times daily. X 1 week     guaiFENesin 600 MG 12 hr tablet  Commonly known as:  MUCINEX  Take 2 tablets (1,200 mg total) by mouth 2 (two) times daily.     guaifenesin 100 MG/5ML syrup  Commonly known as:  ROBITUSSIN  Take 200 mg by mouth daily as needed for cough. For cough     hydrochlorothiazide 25 MG tablet  Commonly known as:  HYDRODIURIL  Take 1 tablet (25 mg total) by mouth daily.     nicotine 14 mg/24hr patch  Commonly known as:  NICODERM CQ - dosed in mg/24 hours  Place 1 patch onto the skin daily.     pantoprazole 40 MG tablet  Commonly known as:  PROTONIX  Take 1 tablet (40 mg total) by mouth daily.     potassium chloride 10 MEQ tablet  Commonly known as:  K-DUR  Take 1 tablet (10 mEq total) by mouth daily.         Brief H and P: For complete details please refer to admission H and P, but in brief Robin Navarro is a 47 y.o. female history of hypertension presented to the ER with shortness of breath. Patient's shortness of breath started today morning 5 AM. Shortness of breath was present even at rest. Patient has been having cough since last evening which was mildly productive. Denies any fever chills. Denies any chest pain. Since patient's short of breath and cough was persistent patient came to the ER. CT angiogram of the chest shows multifocal pneumonia and has been admitted for further management. Patient otherwise denies any nausea vomiting abdominal pain fever chills dizziness or loss of consciousness.   Hospital Course:     Community acquired pneumonia: Symptoms have significantly improved, patient was started on a Zithromax and Rocephin at the time of admission. HIV test negative, urine strep Antigen, flu test negative.  Patient had also acute bronchitis along with pneumonia, hence placed on albuterol inhaler. Leukocytosis of 17.5 has improved, 9.3 at discharge.    HTN (hypertension): Started on HCTZ    Acute bronchitis: Patient was placed on scheduled breathing treatment while inpatient. She has significant improved and is prescribed albuterol inhaler as needed.   Day of Discharge BP 131/79  Pulse 86  Temp(Src) 98.7 F (37.1 C) (Oral)  Resp 18  Ht 5' (1.524 m)  Wt 89.313 kg (196 lb 14.4 oz)  BMI 38.45 kg/m2  SpO2 91%  LMP 02/10/2013  Physical Exam: General: Alert and awake oriented x3 not in any acute distress. HEENT: anicteric sclera,  pupils reactive to light and accommodation CVS: S1-S2 clear no murmur rubs or gallops Chest: clear to auscultation bilaterally, no wheezing rales or rhonchi Abdomen: soft nontender, nondistended, normal bowel sounds, no organomegaly Extremities: no cyanosis, clubbing or edema noted bilaterally Neuro: Cranial nerves II-XII intact, no focal neurological deficits   The results of significant diagnostics from this hospitalization (including imaging, microbiology, ancillary and laboratory) are listed below for reference.    LAB RESULTS: Basic Metabolic Panel:  Recent Labs Lab 02/11/13 0518 02/12/13 0450  NA 138 138  K 3.2* 3.4*  CL 103 102  CO2 24 27  GLUCOSE 118* 85  BUN 9 12  CREATININE 0.76 0.99  CALCIUM 9.6 9.3   Liver Function Tests:  Recent Labs Lab 02/10/13 1710  AST 20  ALT 14  ALKPHOS 88  BILITOT 0.4  PROT 8.0  ALBUMIN 3.9   No results found for this basename: LIPASE, AMYLASE,  in the last 168 hours No results found for this basename: AMMONIA,  in the last 168 hours CBC:  Recent Labs Lab 02/11/13 0518 02/12/13 0450  WBC 17.5* 9.3  HGB 13.2 12.6  HCT 37.3 36.0  MCV 85.2 86.5  PLT 267 248   Cardiac Enzymes: No results found for this basename: CKTOTAL, CKMB, CKMBINDEX, TROPONINI,  in the last 168 hours BNP: No components found with this basename: POCBNP,  CBG: No results found for this basename: GLUCAP,  in the last 168 hours  Significant Diagnostic Studies:  Ct Angio Chest Pe W/cm &/or Wo Cm  02/10/2013  *RADIOLOGY REPORT*  Clinical Data: Short of breath.  CT ANGIOGRAPHY CHEST  Technique:  Multidetector CT imaging of the chest using the standard protocol during bolus administration of intravenous contrast. Multiplanar reconstructed images including MIPs were obtained and reviewed to evaluate the vascular anatomy.  Contrast: 26mL OMNIPAQUE IOHEXOL 350 MG/ML SOLN  Comparison: 02/10/2013.  Findings: Technically adequate study for pulmonary embolism.  No  pulmonary embolus is present.  No axillary adenopathy.  The thoracic esophagus demonstrates thickening just inferior to the level of the aortic arch.  This is nonspecific and could be associated with esophagitis.  Follow-up endoscopy is recommended.  Prominent lymphoid tissue is present in both pulmonary hila.  There is airspace opacity in the right middle lobe and both lower lobes. No involvement of the upper lobes.  No convincing evidence of interlobular septal thickening.  Differential considerations for airspace disease include multi focal pneumonia or pulmonary edema primarily.  There is also airspace disease within the lingula.  No pericardial effusion.  No pleural effusion.  Incidental imaging of the upper abdomen is within normal limits.  No aggressive osseous lesions.  IMPRESSION: 1.  Multifocal peripheral airspace opacity compatible with multifocal pneumonia; pulmonary edema is considered less likely. 2.  Negative for pulmonary embolus or acute aortic abnormality. 3.  Mid thoracic esophageal thickening.  Although nonspecific, this could be related to esophagitis.  Follow-up endoscopy recommended.   Original Report Authenticated By: Dereck Ligas, M.D.    Dg Chest Port 1 View  02/10/2013  *RADIOLOGY REPORT*  Clinical Data: Short of breath.  PORTABLE CHEST - 1 VIEW  Comparison: None.  Findings: Cardiopericardial silhouette within normal limits for projection.  No airspace disease.  No pleural effusion. Monitoring leads are projected over the chest.  IMPRESSION: No acute cardiopulmonary disease.   Original Report Authenticated By: Dereck Ligas, M.D.     2D ECHO:   Disposition and Follow-up:     Discharge Orders   Future Orders Complete By Expires     Diet - low sodium heart healthy  As directed     Increase activity slowly  As directed         DISPOSITION: Home DIET: Heart healthy diet ACTIVITY: As tolerated  DISCHARGE FOLLOW-UP Follow-up Information   Follow up with  Hulen Shouts, MD. Schedule an appointment as soon as possible for a visit in 10 days. (for hospital follow-up. Please get chest Xray after 2 weeks to confirm resolution of Pneumonia. Please have PCP refer to GI doctor if any symptoms of worsening acid reflux. )    Contact information:   [place] [phone]       Time spent on Discharge: 40 mins  Signed:   RAI,RIPUDEEP M.D. [facility] 02/12/2013, 11:53 AM Pager: [phone]

## 2013-02-17 LAB — CULTURE, BLOOD (ROUTINE X 2): Culture: NO GROWTH

## 2014-01-06 ENCOUNTER — Emergency Department (HOSPITAL_COMMUNITY): Payer: BC Managed Care – PPO

## 2014-01-06 ENCOUNTER — Encounter (HOSPITAL_COMMUNITY): Payer: Self-pay | Admitting: Emergency Medicine

## 2014-01-06 ENCOUNTER — Emergency Department (HOSPITAL_COMMUNITY)
Admission: EM | Admit: 2014-01-06 | Discharge: 2014-01-06 | Disposition: A | Discharge status: Home / Self Care | Payer: BC Managed Care – PPO | Attending: Emergency Medicine | Admitting: Emergency Medicine

## 2014-01-06 DIAGNOSIS — J189 Pneumonia, unspecified organism: Secondary | ICD-10-CM

## 2014-01-06 DIAGNOSIS — J209 Acute bronchitis, unspecified: Secondary | ICD-10-CM

## 2014-01-06 DIAGNOSIS — Z79899 Other long term (current) drug therapy: Secondary | ICD-10-CM | POA: Insufficient documentation

## 2014-01-06 DIAGNOSIS — I10 Essential (primary) hypertension: Secondary | ICD-10-CM | POA: Insufficient documentation

## 2014-01-06 DIAGNOSIS — F17200 Nicotine dependence, unspecified, uncomplicated: Secondary | ICD-10-CM | POA: Insufficient documentation

## 2014-01-06 DIAGNOSIS — J45901 Unspecified asthma with (acute) exacerbation: Secondary | ICD-10-CM | POA: Insufficient documentation

## 2014-01-06 DIAGNOSIS — J159 Unspecified bacterial pneumonia: Secondary | ICD-10-CM | POA: Insufficient documentation

## 2014-01-06 LAB — COMPREHENSIVE METABOLIC PANEL
ALBUMIN: 3.8 g/dL (ref 3.5–5.2)
ALT: 14 U/L (ref 0–35)
AST: 21 U/L (ref 0–37)
Alkaline Phosphatase: 89 U/L (ref 39–117)
BUN: 11 mg/dL (ref 6–23)
CALCIUM: 9.3 mg/dL (ref 8.4–10.5)
CO2: 20 mEq/L (ref 19–32)
CREATININE: 0.72 mg/dL (ref 0.50–1.10)
Chloride: 102 mEq/L (ref 96–112)
GFR calc Af Amer: >90 mL/min (ref >90)
Glucose, Bld: 101 mg/dL — ABNORMAL HIGH (ref 70–99)
Potassium: 3.8 mEq/L (ref 3.7–5.3)
Sodium: 139 mEq/L (ref 137–147)
TOTAL PROTEIN: 7.9 g/dL (ref 6.0–8.3)
Total Bilirubin: 0.3 mg/dL (ref 0.3–1.2)

## 2014-01-06 LAB — I-STAT TROPONIN, ED
Troponin i, poc: 0 ng/mL (ref 0.00–0.08)
Troponin i, poc: 0.01 ng/mL (ref 0.00–0.08)

## 2014-01-06 LAB — URINALYSIS, ROUTINE W REFLEX MICROSCOPIC
Bilirubin Urine: NEGATIVE
Glucose, UA: NEGATIVE mg/dL
KETONES UR: NEGATIVE mg/dL
LEUKOCYTES UA: NEGATIVE
NITRITE: NEGATIVE
PH: 5 (ref 5.0–8.0)
Protein, ur: 30 mg/dL — AB
SPECIFIC GRAVITY, URINE: 1.026 (ref 1.005–1.030)
Urobilinogen, UA: 0.2 mg/dL (ref 0.0–1.0)

## 2014-01-06 LAB — URINE MICROSCOPIC-ADD ON

## 2014-01-06 LAB — CBC
HCT: 40.2 % (ref 36.0–46.0)
Hemoglobin: 14.4 g/dL (ref 12.0–15.0)
MCH: 31.2 pg (ref 26.0–34.0)
MCHC: 35.8 g/dL (ref 30.0–36.0)
MCV: 87 fL (ref 78.0–100.0)
PLATELETS: 260 10*3/uL (ref 150–400)
RBC: 4.62 MIL/uL (ref 3.87–5.11)
RDW: 15.2 % (ref 11.5–15.5)
WBC: 12.7 10*3/uL — ABNORMAL HIGH (ref 4.0–10.5)

## 2014-01-06 IMAGING — CR DG CHEST 2V
2 series · 2 of 2 positions shown · non-contrast
Comparison: CT ANGIO CHEST W/CM &/OR WO/CM dated [DATE]; DG
CHEST 1V PORT dated [DATE]

CLINICAL DATA: Shortness of breath.

EXAM:
CHEST - 2 VIEW

[w chest pa]
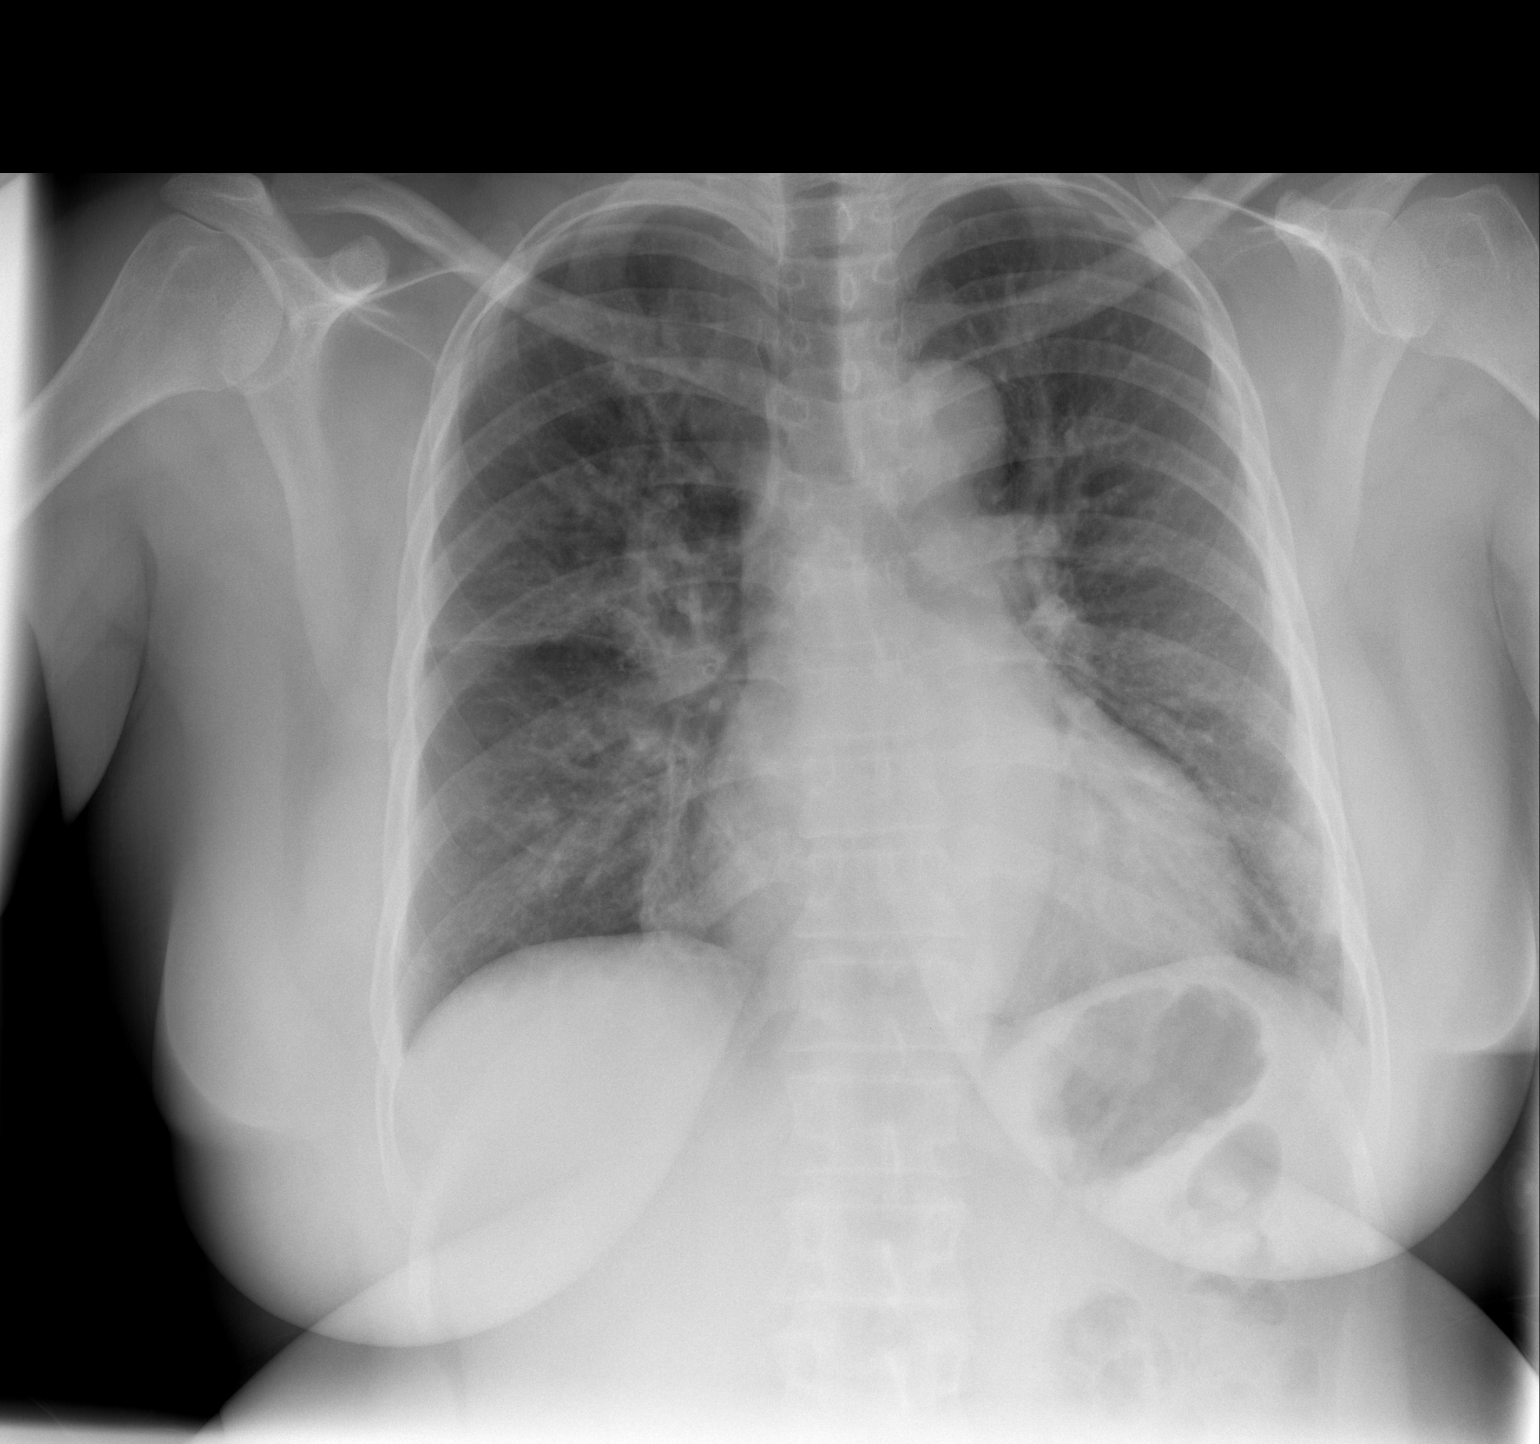

[w chest lat]
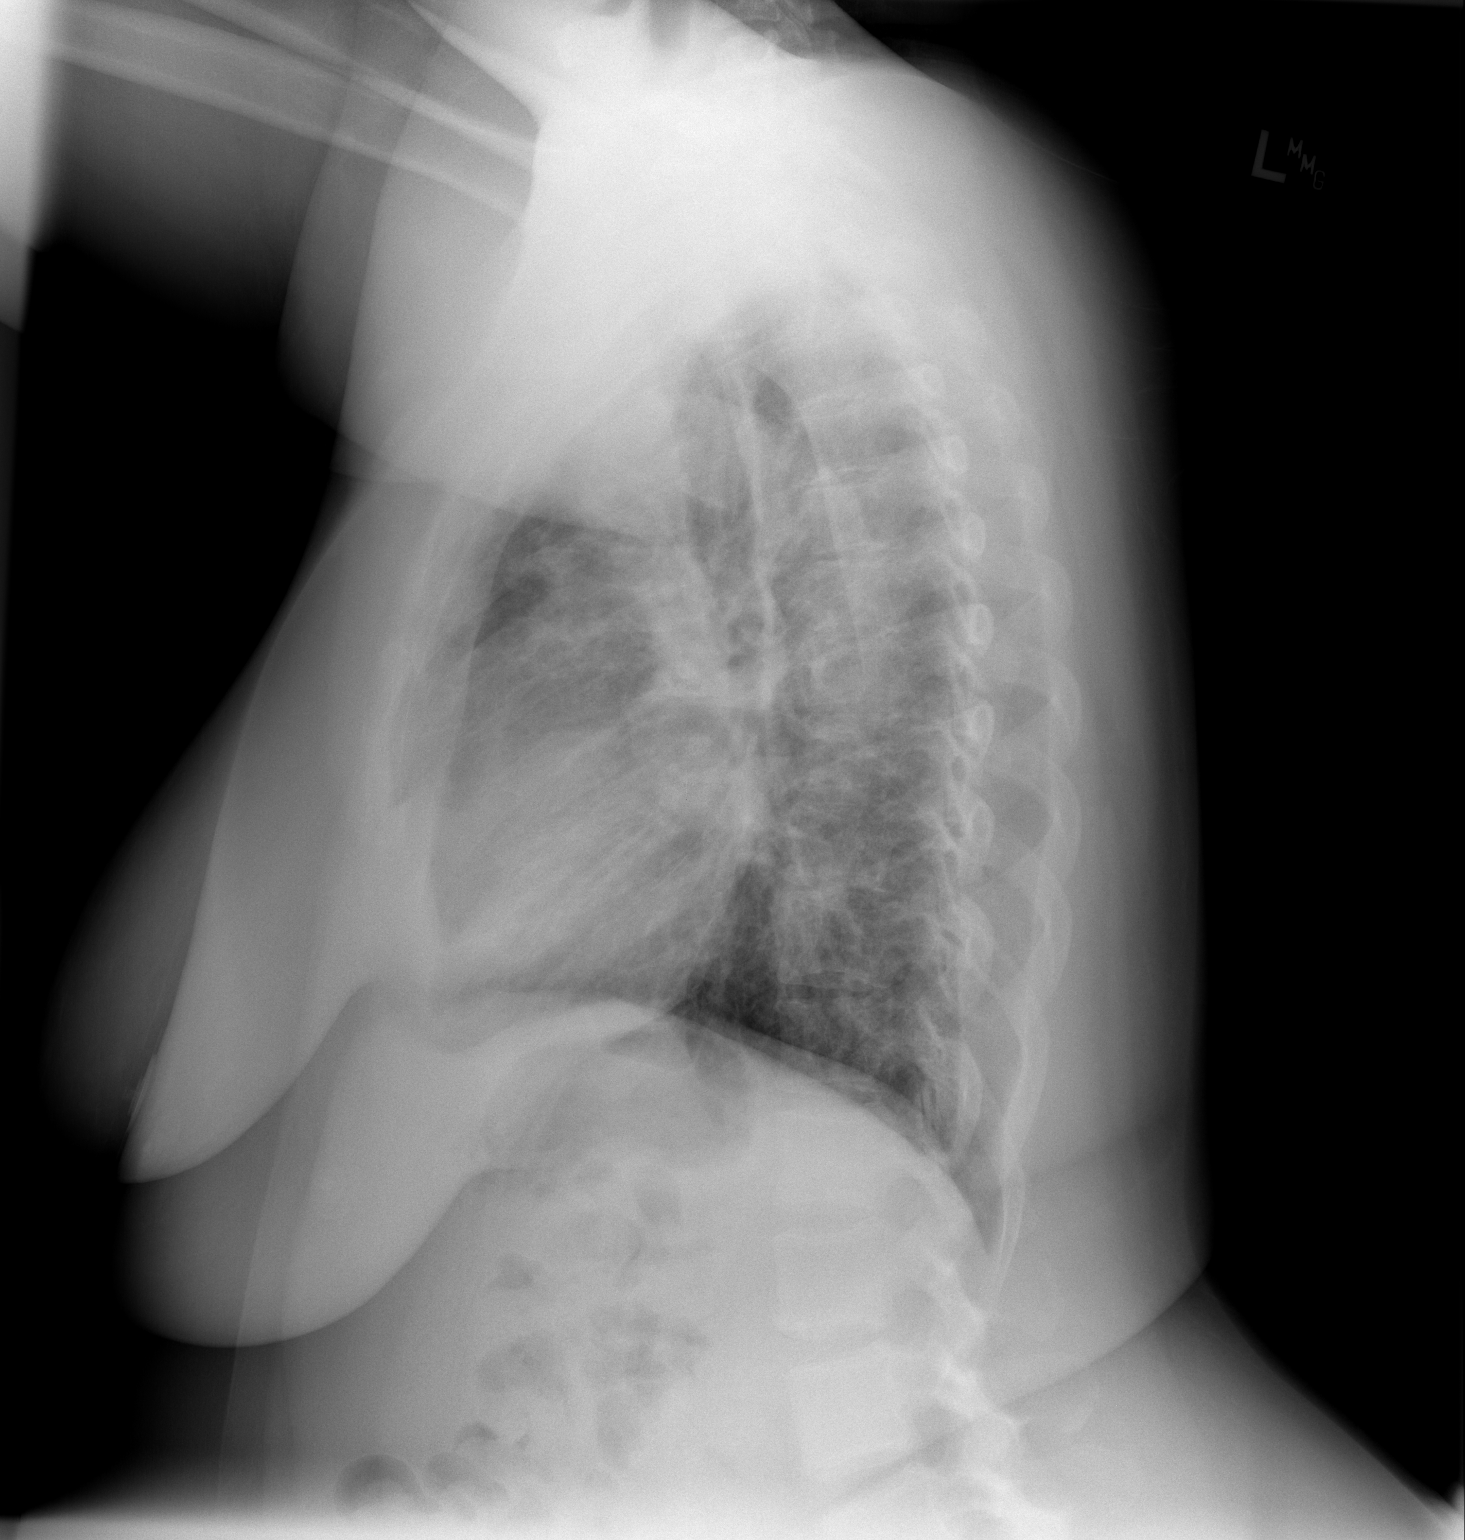

[2 of 2 positions shown; findings below may reference images not displayed]

FINDINGS: Patchy airspace infiltrates are identified and right upper and lower
lung zones and also potentially at the left lung base. No overt
edema is identified. There is no evidence of pleural fluid. The
heart size and mediastinal contours are within normal limits. The
visualized skeletal structures are unremarkable.
IMPRESSION: Multifocal infiltrates, predominantly in both right upper and lower
lung zones. There also may be subtle infiltrate at the left lung
base.

## 2014-01-06 MED ORDER — SODIUM CHLORIDE 0.9 % IV SOLN
INTRAVENOUS | Status: DC
  Administered 2014-01-06: 19:00:00 via INTRAVENOUS

## 2014-01-06 MED ORDER — DEXTROSE 5 % IV SOLN
1.0000 g | Freq: Once | INTRAVENOUS | Status: AC
  Administered 2014-01-06: 1 g via INTRAVENOUS
  Filled 2014-01-06: qty 10

## 2014-01-06 MED ORDER — DM-GUAIFENESIN ER 30-600 MG PO TB12: 1.0000 | ORAL_TABLET | Freq: Two times a day (BID) | ORAL | Status: DC

## 2014-01-06 MED ORDER — AZITHROMYCIN 250 MG PO TABS: 250.0000 mg | ORAL_TABLET | Freq: Every day | ORAL | Status: DC

## 2014-01-06 MED ORDER — IPRATROPIUM-ALBUTEROL 0.5-2.5 (3) MG/3ML IN SOLN
3.0000 mL | Freq: Once | RESPIRATORY_TRACT | Status: AC
  Administered 2014-01-06: 3 mL via RESPIRATORY_TRACT
  Filled 2014-01-06: qty 3

## 2014-01-06 MED ORDER — SODIUM CHLORIDE 0.9 % IV BOLUS (SEPSIS)
500.0000 mL | Freq: Once | INTRAVENOUS | Status: AC
  Administered 2014-01-06: 500 mL via INTRAVENOUS

## 2014-01-06 NOTE — ED Notes (Signed)
Pt noted to be eating bojangles. Asked to remain NPO.

## 2014-01-06 NOTE — ED Notes (Signed)
Cough x 2 days and chest tightness feels weird to breath she states no nausea or vomiting

## 2014-01-06 NOTE — ED Provider Notes (Signed)
CSN: [id number]     Arrival date & time 01/06/14  1411 History   First MD Initiated Contact with Patient 01/06/14 1649     Chief Complaint  Patient presents with  . Shortness of Breath     (Consider location/radiation/quality/duration/timing/severity/associated sxs/prior Treatment) Patient is a 48 y.o. female presenting with shortness of breath. The history is provided by the patient.  Shortness of Breath Associated symptoms: cough and wheezing   Associated symptoms: no abdominal pain, no chest pain, no fever, no headaches, no rash and no vomiting    patient with 2 day history of shortness of breath productive cough. Concerns she has a pneumonia. Patient denies any chest pain or chest tightness to me appeared lobe a soreness associated with the cough only. No fevers. No nausea no vomiting no diarrhea. Patient stopped bringing up yellow sputum. Patient has a history of asthma and uses albuterol inhaler regular basis.  Past Medical History  Diagnosis Date  . Hypertension    History reviewed. No pertinent past surgical history. Family History  Problem Relation Age of Onset  . Diabetes Mother   . Hypertension Mother    History  Substance Use Topics  . Smoking status: Current Every Day Smoker -- 0.01 packs/day  . Smokeless tobacco: Not on file  . Alcohol Use: Yes     Comment: 1 every six months   OB History   Grav Para Term Preterm Abortions TAB SAB Ect Mult Living   1 1             Review of Systems  Constitutional: Negative for fever.  HENT: Positive for congestion.   Respiratory: Positive for cough, shortness of breath and wheezing. Negative for chest tightness.   Cardiovascular: Negative for chest pain.  Gastrointestinal: Negative for nausea, vomiting and abdominal pain.  Genitourinary: Negative for dysuria.  Musculoskeletal: Negative for back pain.  Skin: Negative for rash.  Neurological: Negative for headaches.  Hematological: Does not bruise/bleed easily.   Psychiatric/Behavioral: Negative for confusion.      Allergies  Review of patient's allergies indicates no known allergies.  Home Medications   Current Outpatient Rx  Name  Route  Sig  Dispense  Refill  . albuterol (PROVENTIL HFA;VENTOLIN HFA) 108 (90 BASE) MCG/ACT inhaler   Inhalation   Inhale 2 puffs into the lungs every 6 (six) hours as needed for wheezing.   1 Inhaler   2   . ibuprofen (ADVIL,MOTRIN) 200 MG tablet   Oral   Take 400 mg by mouth every 6 (six) hours as needed for moderate pain.         Marland Kitchen lisinopril-hydrochlorothiazide (PRINZIDE,ZESTORETIC) 10-12.5 MG per tablet   Oral   Take 1 tablet by mouth daily.         Marland Kitchen azithromycin (ZITHROMAX) 250 MG tablet   Oral   Take 1 tablet (250 mg total) by mouth daily. Take first 2 tablets together, then 1 every day until finished.   6 tablet   0   . dextromethorphan-guaiFENesin (MUCINEX DM) 30-600 MG per 12 hr tablet   Oral   Take 1 tablet by mouth 2 (two) times daily.   14 tablet   1    BP 155/93  Pulse 87  Temp(Src) 98.7 F (37.1 C) (Oral)  Resp 35  SpO2 93%  LMP 12/23/2013 Physical Exam  Nursing note and vitals reviewed. Constitutional: She is oriented to person, place, and time. She appears well-developed and well-nourished. No distress.  HENT:  Head: Normocephalic and atraumatic.  Mouth/Throat: Oropharynx is clear and moist.  Eyes: Conjunctivae and EOM are normal. Pupils are equal, round, and reactive to light.  Neck: Normal range of motion.  Cardiovascular: Normal rate, regular rhythm and normal heart sounds.   Pulmonary/Chest: Effort normal. No respiratory distress. She has wheezes.  Abdominal: Soft. Bowel sounds are normal. There is no tenderness.  Neurological: She is alert and oriented to person, place, and time. No cranial nerve deficit. She exhibits normal muscle tone. Coordination normal.  Skin: Skin is warm.    ED Course  Procedures (including critical care time) Labs Review Labs  Reviewed  CBC - Abnormal; Notable for the following:    WBC 12.7 (*)    All other components within normal limits  COMPREHENSIVE METABOLIC PANEL - Abnormal; Notable for the following:    Glucose, Bld 101 (*)    All other components within normal limits  URINALYSIS, ROUTINE W REFLEX MICROSCOPIC - Abnormal; Notable for the following:    Hgb urine dipstick SMALL (*)    Protein, ur 30 (*)    All other components within normal limits  URINE MICROSCOPIC-ADD ON - Abnormal; Notable for the following:    Squamous Epithelial / LPF FEW (*)    Bacteria, UA FEW (*)    All other components within normal limits  I-STAT TROPOININ, ED  I-STAT TROPOININ, ED   Results for orders placed during the hospital encounter of 01/06/14  CBC      Result Value Ref Range   WBC 12.7 (*) 4.0 - 10.5 K/uL   RBC 4.62  3.87 - 5.11 MIL/uL   Hemoglobin 14.4  12.0 - 15.0 g/dL   HCT 40.2  36.0 - 46.0 %   MCV 87.0  78.0 - 100.0 fL   MCH 31.2  26.0 - 34.0 pg   MCHC 35.8  30.0 - 36.0 g/dL   RDW 15.2  11.5 - 15.5 %   Platelets 260  150 - 400 K/uL  COMPREHENSIVE METABOLIC PANEL      Result Value Ref Range   Sodium 139  137 - 147 mEq/L   Potassium 3.8  3.7 - 5.3 mEq/L   Chloride 102  96 - 112 mEq/L   CO2 20  19 - 32 mEq/L   Glucose, Bld 101 (*) 70 - 99 mg/dL   BUN 11  6 - 23 mg/dL   Creatinine, Ser 0.72  0.50 - 1.10 mg/dL   Calcium 9.3  8.4 - 10.5 mg/dL   Total Protein 7.9  6.0 - 8.3 g/dL   Albumin 3.8  3.5 - 5.2 g/dL   AST 21  0 - 37 U/L   ALT 14  0 - 35 U/L   Alkaline Phosphatase 89  39 - 117 U/L   Total Bilirubin 0.3  0.3 - 1.2 mg/dL   GFR calc non Af Amer >90  >90 mL/min   GFR calc Af Amer >90  >90 mL/min  URINALYSIS, ROUTINE W REFLEX MICROSCOPIC      Result Value Ref Range   Color, Urine YELLOW  YELLOW   APPearance CLEAR  CLEAR   Specific Gravity, Urine 1.026  1.005 - 1.030   pH 5.0  5.0 - 8.0   Glucose, UA NEGATIVE  NEGATIVE mg/dL   Hgb urine dipstick SMALL (*) NEGATIVE   Bilirubin Urine NEGATIVE   NEGATIVE   Ketones, ur NEGATIVE  NEGATIVE mg/dL   Protein, ur 30 (*) NEGATIVE mg/dL   Urobilinogen, UA 0.2  0.0 - 1.0 mg/dL   Nitrite NEGATIVE  NEGATIVE   Leukocytes, UA NEGATIVE  NEGATIVE  URINE MICROSCOPIC-ADD ON      Result Value Ref Range   Squamous Epithelial / LPF FEW (*) RARE   RBC / HPF 3-6  <3 RBC/hpf   Bacteria, UA FEW (*) RARE   Urine-Other MUCOUS PRESENT    I-STAT TROPOININ, ED      Result Value Ref Range   Troponin i, poc 0.00  0.00 - 0.08 ng/mL   Comment 3           I-STAT TROPOININ, ED      Result Value Ref Range   Troponin i, poc 0.01  0.00 - 0.08 ng/mL   Comment 3             Imaging Review Dg Chest 2 View  01/06/2014   CLINICAL DATA:  Shortness of breath.  EXAM: CHEST - 2 VIEW  COMPARISON:  CT ANGIO CHEST W/CM &/OR WO/CM dated 02/10/2013; DG CHEST 1V PORT dated 02/10/2013  FINDINGS: Patchy airspace infiltrates are identified and right upper and lower lung zones and also potentially at the left lung base. No overt edema is identified. There is no evidence of pleural fluid. The heart size and mediastinal contours are within normal limits. The visualized skeletal structures are unremarkable.  IMPRESSION: Multifocal infiltrates, predominantly in both right upper and lower lung zones. There also may be subtle infiltrate at the left lung base.   Electronically Signed   By: Aletta Edouard M.D.   On: 01/06/2014 15:23     EKG Interpretation   Date/Time:  Monday January 06 2014 14:18:25 EDT Ventricular Rate:  98 PR Interval:  146 QRS Duration: 92 QT Interval:  352 QTC Calculation: 449 R Axis:   99 Text Interpretation:  Normal sinus rhythm Possible Left atrial enlargement  Rightward axis Incomplete right bundle branch block ST \\T \ T wave  abnormality, consider inferolateral ischemia Abnormal ECG No significant  change since last tracing Confirmed by Lorian Yaun  MD, [place] [phone]) on  01/06/2014 5:08:46 PM      MDM   Final diagnoses:  Community acquired pneumonia  Acute  bronchitis    Patient's chest x-ray consistent with pneumonia. Patient's symptoms consistent with pneumonia. And also acute bronchitis. Patient has not been in the hospital for the past 90 days this is a community-acquired pneumonia. Patient received 1 g Rocephin here in the emergency department. Will be continued on Zithromax. In addition a Mucinex DM will be provided for the cough. In addition patient will continue use her albuterol inhaler that she or he has 2 puffs every 6 hours for the next 7 days. Patient lab workup here without significant abnormalities to include a troponin that was done because of some questionable ischemic changes on her EKG. Troponin was negative. Patient has not had any acute chest pain. Patient's white blood cell count mildly elevated at 12.7. Otherwise labs normal. Patient improves significantly after that a computer all Atrovent nebulizer in the first dose of antibiotic.    Mervin Kung, MD 01/06/14 2008

## 2014-01-06 NOTE — Discharge Instructions (Signed)
X-rays consistent with pneumonia bilateral this would be a kidney acquired pneumonia. Take antibiotic as directed. Would expect you to improve in a couple days. Also usual albuterol inhaler 2 puffs every 6 hours for the next 7 days. Also take Mucinex DM as directed. Return for any newer worse symptoms. Would expect 2 days before you start to improve. Make appointment to followup with your doctor in the next few days. Work note provided.

## 2014-08-04 ENCOUNTER — Encounter (HOSPITAL_COMMUNITY): Payer: Self-pay | Admitting: Emergency Medicine

## 2015-02-12 ENCOUNTER — Ambulatory Visit: Payer: Self-pay

## 2015-10-16 ENCOUNTER — Other Ambulatory Visit (HOSPITAL_COMMUNITY)
Admission: RE | Admit: 2015-10-16 | Discharge: 2015-10-16 | Disposition: A | Discharge status: Home / Self Care | Payer: Self-pay | Source: Ambulatory Visit | Attending: Family Medicine | Admitting: Family Medicine

## 2015-10-16 ENCOUNTER — Other Ambulatory Visit: Payer: Self-pay | Admitting: Family Medicine

## 2015-10-16 DIAGNOSIS — Z1151 Encounter for screening for human papillomavirus (HPV): Secondary | ICD-10-CM | POA: Insufficient documentation

## 2015-10-16 DIAGNOSIS — Z01411 Encounter for gynecological examination (general) (routine) with abnormal findings: Secondary | ICD-10-CM | POA: Insufficient documentation

## 2015-10-21 LAB — CYTOLOGY - PAP

## 2017-02-06 ENCOUNTER — Other Ambulatory Visit: Payer: Self-pay

## 2017-02-06 ENCOUNTER — Encounter (HOSPITAL_COMMUNITY): Payer: Self-pay | Admitting: Vascular Surgery

## 2017-02-06 ENCOUNTER — Emergency Department (HOSPITAL_COMMUNITY): Payer: BLUE CROSS/BLUE SHIELD

## 2017-02-06 DIAGNOSIS — J9801 Acute bronchospasm: Secondary | ICD-10-CM | POA: Insufficient documentation

## 2017-02-06 DIAGNOSIS — F17200 Nicotine dependence, unspecified, uncomplicated: Secondary | ICD-10-CM | POA: Insufficient documentation

## 2017-02-06 DIAGNOSIS — I10 Essential (primary) hypertension: Secondary | ICD-10-CM | POA: Diagnosis not present

## 2017-02-06 DIAGNOSIS — R0602 Shortness of breath: Secondary | ICD-10-CM | POA: Diagnosis present

## 2017-02-06 IMAGING — DX DG CHEST 2V
2 series · 2 of 2 positions shown · non-contrast
Comparison: Chest radiograph performed [DATE]

CLINICAL DATA: Acute onset of shortness of breath and productive
cough. Initial encounter.

EXAM:
CHEST  2 VIEW

[chest pa]
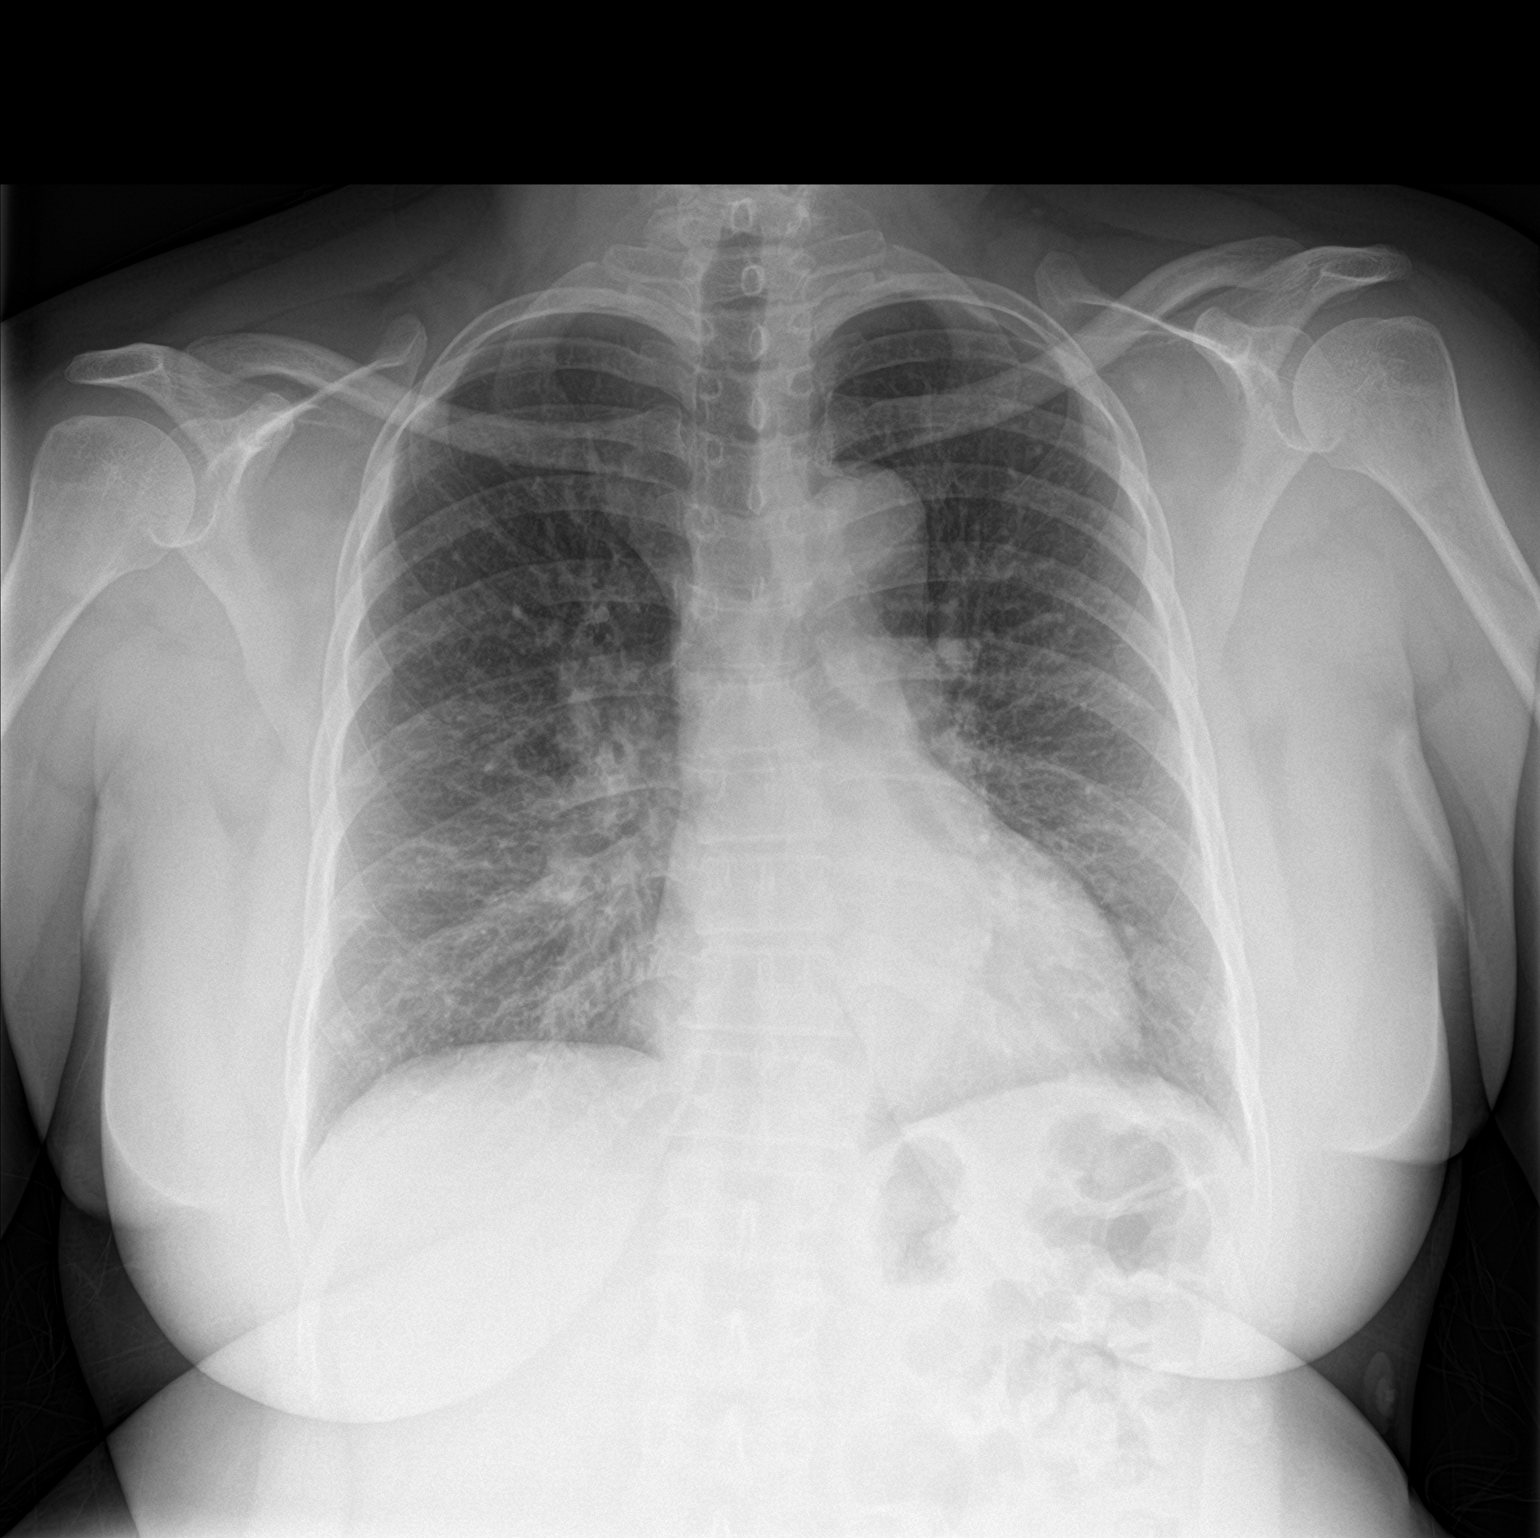

[chest lat]
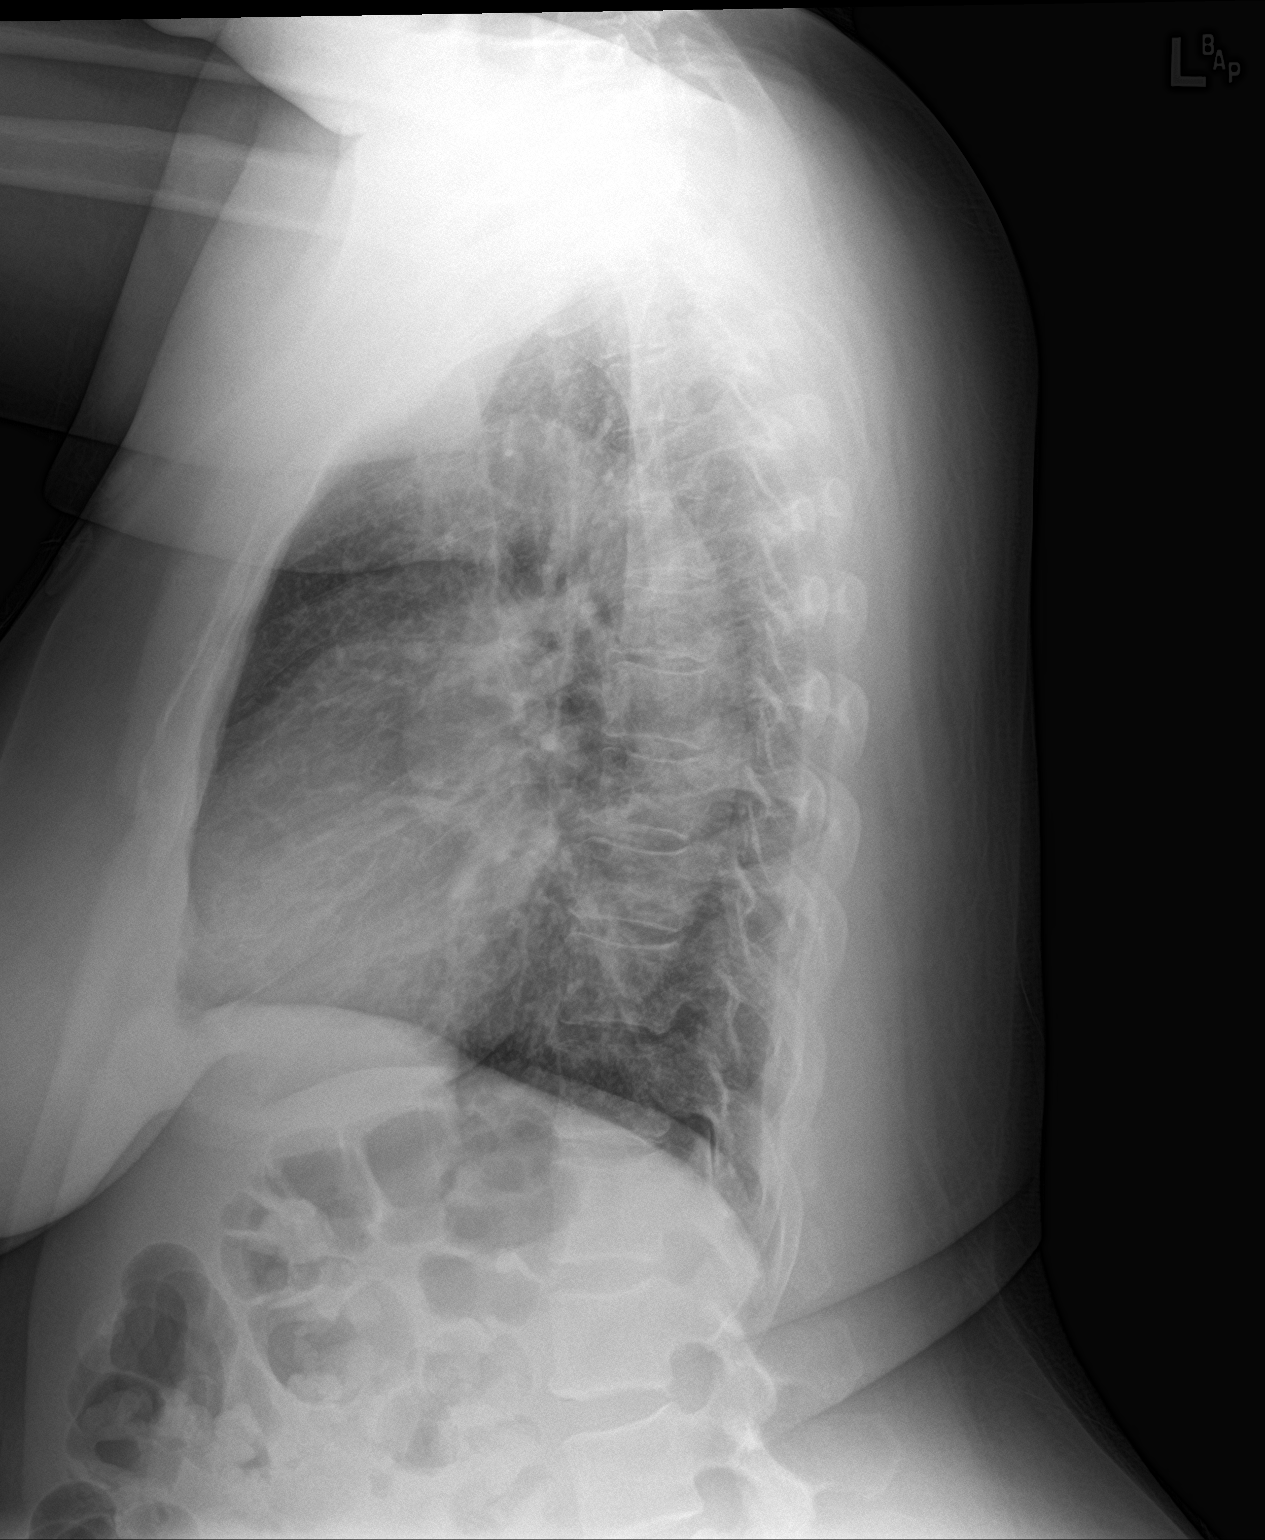

[2 of 2 positions shown; findings below may reference images not displayed]

FINDINGS: The lungs are well-aerated. Vascular congestion is noted.
Peribronchial thickening is seen. Increased interstitial markings
are noted. There is no evidence of pleural effusion or pneumothorax.

The heart is normal in size; the mediastinal contour is within
normal limits. No acute osseous abnormalities are seen.
IMPRESSION: Vascular congestion. Peribronchial thickening seen. Increased
interstitial markings noted. This may be chronic in nature, or could
reflect underlying infection.

## 2017-02-06 MED ORDER — ALBUTEROL SULFATE (2.5 MG/3ML) 0.083% IN NEBU
INHALATION_SOLUTION | RESPIRATORY_TRACT | Status: AC
  Filled 2017-02-06: qty 6

## 2017-02-06 MED ORDER — ALBUTEROL SULFATE (2.5 MG/3ML) 0.083% IN NEBU
5.0000 mg | INHALATION_SOLUTION | Freq: Once | RESPIRATORY_TRACT | Status: AC
  Administered 2017-02-06: 5 mg via RESPIRATORY_TRACT

## 2017-02-06 NOTE — ED Triage Notes (Signed)
Pt reports to the ED via [facility] for eval of SOB. Pt is from home. She states she has been having these symptoms since yesterday. She used her MDI without relief. She received 125 mg of Solumedrol, 10 mg of albuterol, and 0.5 mg of Atrovent PTA. Pt had significant relief after these treatments. She denies hx of asthma but will get these flare ups when the seasons change.

## 2017-02-07 ENCOUNTER — Emergency Department (HOSPITAL_COMMUNITY)
Admission: EM | Admit: 2017-02-07 | Discharge: 2017-02-07 | Disposition: A | Discharge status: Home / Self Care | Payer: BLUE CROSS/BLUE SHIELD | Attending: Emergency Medicine | Admitting: Emergency Medicine

## 2017-02-07 DIAGNOSIS — J9801 Acute bronchospasm: Secondary | ICD-10-CM

## 2017-02-07 DIAGNOSIS — I10 Essential (primary) hypertension: Secondary | ICD-10-CM

## 2017-02-07 MED ORDER — PREDNISONE 10 MG PO TABS: 20.0000 mg | ORAL_TABLET | Freq: Every day | ORAL | 0 refills | Status: DC

## 2017-02-07 MED ORDER — AMLODIPINE BESYLATE 10 MG PO TABS: 10.0000 mg | ORAL_TABLET | Freq: Every day | ORAL | 0 refills | Status: DC

## 2017-02-07 MED ORDER — AMLODIPINE BESYLATE 5 MG PO TABS
10.0000 mg | ORAL_TABLET | Freq: Once | ORAL | Status: AC
  Administered 2017-02-07: 10 mg via ORAL
  Filled 2017-02-07: qty 2

## 2017-02-07 NOTE — Discharge Instructions (Signed)
Tonight you treated for bronchospasm or wheezing U been given a prescription for prednisone.  Please take this daily until all tablets have been completed.  You've also been given a prescription for Norvasc, which is an antihypertensive.  This is very important that you take it daily.  I would monitor your blood pressure 2 or 3 times a week and keep a written record and to you aren't able to see your physician. .  Since we started this medication at night.  I recommend taking it at bedtime.

## 2017-02-07 NOTE — ED Provider Notes (Signed)
[facility] DEPT Provider Note   CSN: [id number] Arrival date & time: 02/06/17  2117     History   Chief Complaint Chief Complaint  Patient presents with  . Shortness of Breath    HPI Robin Navarro is a 51 y.o. female.   This is a 51 year old female with history of seasonal he induced asthma since 2015.  She required one hospitalization in early 2015 none since then.  She states for the past 3 days.  She's had shortness of breath.  She did try using an inhaler at home on Sunday and Monday without any relief. .  Denies any fever, URI symptoms. .  Patient states she has a history of hypertension but stopped taking her antihypertensives because it was causing constipation.      Past Medical History:  Diagnosis Date  . Hypertension     Patient Active Problem List   Diagnosis Date Noted  . Acute bronchitis 02/11/2013  . Community acquired pneumonia 02/10/2013  . HTN (hypertension) 02/10/2013    History reviewed. No pertinent surgical history.  OB History    Gravida Para Term Preterm AB Living   1 1           SAB TAB Ectopic Multiple Live Births                   Home Medications    Prior to Admission medications   Medication Sig Start Date End Date Taking? Authorizing Provider  albuterol (PROVENTIL HFA;VENTOLIN HFA) 108 (90 BASE) MCG/ACT inhaler Inhale 2 puffs into the lungs every 6 (six) hours as needed for wheezing. Patient not taking: Reported on 02/07/2017 02/12/13   Rai, Vernelle Emerald, MD  amLODipine (NORVASC) 10 MG tablet Take 1 tablet (10 mg total) by mouth daily. 02/07/17   Junius Creamer, NP  azithromycin (ZITHROMAX) 250 MG tablet Take 1 tablet (250 mg total) by mouth daily. Take first 2 tablets together, then 1 every day until finished. Patient not taking: Reported on 02/07/2017 01/06/14   Fredia Sorrow, MD  dextromethorphan-guaiFENesin [facility] DM) 30-600 MG per 12 hr tablet Take 1 tablet by mouth 2 (two) times daily. Patient not taking: Reported on 02/07/2017  01/06/14   Fredia Sorrow, MD  predniSONE (DELTASONE) 10 MG tablet Take 2 tablets (20 mg total) by mouth daily. 02/07/17   Junius Creamer, NP    Family History Family History  Problem Relation Age of Onset  . Diabetes Mother   . Hypertension Mother     Social History Social History  Substance Use Topics  . Smoking status: Current Every Day Smoker    Packs/day: 0.01  . Smokeless tobacco: Never Used  . Alcohol use Yes     Comment: 1 every six months     Allergies   Patient has no known allergies.   Review of Systems Review of Systems  Constitutional: Negative for fever.  HENT: Negative for congestion, rhinorrhea and sore throat.   Respiratory: Positive for cough, shortness of breath and wheezing.   Gastrointestinal: Negative for abdominal pain.  Neurological: Negative for dizziness and headaches.  All other systems reviewed and are negative.    Physical Exam Updated Vital Signs BP (!) 206/126   Pulse (!) 104   Temp 98.6 F (37 C) (Oral)   Resp 17   SpO2 92%   Physical Exam  Constitutional: She appears well-developed and well-nourished. No distress.  HENT:  Mouth/Throat: Oropharynx is clear and moist.  Eyes: Pupils are equal, round, and reactive to light.  Neck: Normal range of motion.  Cardiovascular: Normal rate.   Pulmonary/Chest: Effort normal. She has wheezes.   After 3 treatments.  Patient still has a high-pitched expiratory wheeze in the right upper lung field.  Abdominal: Soft.  Neurological: She is alert.  Skin: Skin is warm.  Nursing note and vitals reviewed.    ED Treatments / Results  Labs (all labs ordered are listed, but only abnormal results are displayed) Labs Reviewed - No data to display  EKG  EKG Interpretation None       Radiology Dg Chest 2 View  Result Date: 02/06/2017 CLINICAL DATA:  Acute onset of shortness of breath and productive cough. Initial encounter. EXAM: CHEST  2 VIEW COMPARISON:  Chest radiograph performed  01/06/2014 FINDINGS: The lungs are well-aerated. Vascular congestion is noted. Peribronchial thickening is seen. Increased interstitial markings are noted. There is no evidence of pleural effusion or pneumothorax. The heart is normal in size; the mediastinal contour is within normal limits. No acute osseous abnormalities are seen. IMPRESSION: Vascular congestion. Peribronchial thickening seen. Increased interstitial markings noted. This may be chronic in nature, or could reflect underlying infection. Electronically Signed   By: Garald Balding M.D.   On: 02/06/2017 22:16    Procedures Procedures (including critical care time)  Medications Ordered in ED Medications  albuterol (PROVENTIL) (2.5 MG/3ML) 0.083% nebulizer solution (not administered)  albuterol (PROVENTIL) (2.5 MG/3ML) 0.083% nebulizer solution 5 mg (5 mg Nebulization Given 02/06/17 2129)  amLODipine (NORVASC) tablet 10 mg (10 mg Oral Given 02/07/17 0501)     Initial Impression / Assessment and Plan / ED Course  I have reviewed the triage vital signs and the nursing notes.  Pertinent labs & imaging results that were available during my care of the patient were reviewed by me and considered in my medical decision making (see chart for details).    .  Patient was started on Norvasc with some result in the emergency department.  She has had no further episodes of wheezing.  She is being discharged home with prescription for Norvasc 10 mg daily, as well as prednisone 40 mg daily for 5 days  She is to use her albuterol inhaler every 4-6 hours while awake and then as needed.  She's to follow-up with her PCP in the next week for blood pressure monitoring    Final Clinical Impressions(s) / ED Diagnoses   Final diagnoses:  Bronchospasm  Hypertension, unspecified type    New Prescriptions New Prescriptions   AMLODIPINE (NORVASC) 10 MG TABLET    Take 1 tablet (10 mg total) by mouth daily.   PREDNISONE (DELTASONE) 10 MG TABLET    Take 2  tablets (20 mg total) by mouth daily.     Junius Creamer, NP 02/07/17 [id number]    Ezequiel Essex, MD 02/07/17 [phone]

## 2020-06-12 ENCOUNTER — Inpatient Hospital Stay (HOSPITAL_COMMUNITY)
Admission: EM | Admit: 2020-06-12 | Discharge: 2020-06-15 | DRG: 305 | Disposition: A | Discharge status: Home / Self Care | Payer: Self-pay | Attending: Internal Medicine | Admitting: Internal Medicine

## 2020-06-12 ENCOUNTER — Other Ambulatory Visit: Payer: Self-pay

## 2020-06-12 ENCOUNTER — Encounter (HOSPITAL_COMMUNITY): Payer: Self-pay

## 2020-06-12 DIAGNOSIS — Z8249 Family history of ischemic heart disease and other diseases of the circulatory system: Secondary | ICD-10-CM

## 2020-06-12 DIAGNOSIS — Z7952 Long term (current) use of systemic steroids: Secondary | ICD-10-CM

## 2020-06-12 DIAGNOSIS — I129 Hypertensive chronic kidney disease with stage 1 through stage 4 chronic kidney disease, or unspecified chronic kidney disease: Secondary | ICD-10-CM | POA: Diagnosis present

## 2020-06-12 DIAGNOSIS — I160 Hypertensive urgency: Secondary | ICD-10-CM | POA: Diagnosis present

## 2020-06-12 DIAGNOSIS — E669 Obesity, unspecified: Secondary | ICD-10-CM | POA: Diagnosis present

## 2020-06-12 DIAGNOSIS — Z79899 Other long term (current) drug therapy: Secondary | ICD-10-CM

## 2020-06-12 DIAGNOSIS — I161 Hypertensive emergency: Principal | ICD-10-CM | POA: Diagnosis present

## 2020-06-12 DIAGNOSIS — F17210 Nicotine dependence, cigarettes, uncomplicated: Secondary | ICD-10-CM | POA: Diagnosis present

## 2020-06-12 DIAGNOSIS — N189 Chronic kidney disease, unspecified: Secondary | ICD-10-CM

## 2020-06-12 DIAGNOSIS — Z6839 Body mass index (BMI) 39.0-39.9, adult: Secondary | ICD-10-CM

## 2020-06-12 DIAGNOSIS — N179 Acute kidney failure, unspecified: Secondary | ICD-10-CM | POA: Diagnosis present

## 2020-06-12 DIAGNOSIS — R809 Proteinuria, unspecified: Secondary | ICD-10-CM | POA: Diagnosis present

## 2020-06-12 DIAGNOSIS — N183 Chronic kidney disease, stage 3 unspecified: Secondary | ICD-10-CM | POA: Diagnosis present

## 2020-06-12 DIAGNOSIS — D649 Anemia, unspecified: Secondary | ICD-10-CM | POA: Diagnosis present

## 2020-06-12 DIAGNOSIS — Z20822 Contact with and (suspected) exposure to covid-19: Secondary | ICD-10-CM | POA: Diagnosis present

## 2020-06-12 DIAGNOSIS — E876 Hypokalemia: Secondary | ICD-10-CM | POA: Diagnosis not present

## 2020-06-12 LAB — CBC WITH DIFFERENTIAL/PLATELET
Abs Immature Granulocytes: 0.03 10*3/uL (ref 0.00–0.07)
Basophils Absolute: 0 10*3/uL (ref 0.0–0.1)
Basophils Relative: 0 %
Eosinophils Absolute: 0.1 10*3/uL (ref 0.0–0.5)
Eosinophils Relative: 1 %
HCT: 33.1 % — ABNORMAL LOW (ref 36.0–46.0)
Hemoglobin: 10.9 g/dL — ABNORMAL LOW (ref 12.0–15.0)
Immature Granulocytes: 0 %
Lymphocytes Relative: 24 %
Lymphs Abs: 2.2 10*3/uL (ref 0.7–4.0)
MCH: 30.4 pg (ref 26.0–34.0)
MCHC: 32.9 g/dL (ref 30.0–36.0)
MCV: 92.2 fL (ref 80.0–100.0)
Monocytes Absolute: 0.7 10*3/uL (ref 0.1–1.0)
Monocytes Relative: 8 %
Neutro Abs: 5.9 10*3/uL (ref 1.7–7.7)
Neutrophils Relative %: 67 %
Platelets: 215 10*3/uL (ref 150–400)
RBC: 3.59 MIL/uL — ABNORMAL LOW (ref 3.87–5.11)
RDW: 15.1 % (ref 11.5–15.5)
WBC: 8.9 10*3/uL (ref 4.0–10.5)
nRBC: 0 % (ref 0.0–0.2)

## 2020-06-12 LAB — BASIC METABOLIC PANEL
Anion gap: 14 (ref 5–15)
BUN: 52 mg/dL — ABNORMAL HIGH (ref 6–20)
CO2: 30 mmol/L (ref 22–32)
Calcium: 10.1 mg/dL (ref 8.9–10.3)
Chloride: 95 mmol/L — ABNORMAL LOW (ref 98–111)
Creatinine, Ser: 3.37 mg/dL — ABNORMAL HIGH (ref 0.44–1.00)
GFR calc Af Amer: 17 mL/min — ABNORMAL LOW (ref >60)
GFR calc non Af Amer: 15 mL/min — ABNORMAL LOW (ref >60)
Glucose, Bld: 113 mg/dL — ABNORMAL HIGH (ref 70–99)
Potassium: 3.1 mmol/L — ABNORMAL LOW (ref 3.5–5.1)
Sodium: 139 mmol/L (ref 135–145)

## 2020-06-12 LAB — URINALYSIS, ROUTINE W REFLEX MICROSCOPIC
Bilirubin Urine: NEGATIVE
Glucose, UA: NEGATIVE mg/dL
Ketones, ur: NEGATIVE mg/dL
Nitrite: NEGATIVE
Protein, ur: 100 mg/dL — AB
Specific Gravity, Urine: 1.01 (ref 1.005–1.030)
pH: 6 (ref 5.0–8.0)

## 2020-06-12 MED ORDER — LABETALOL HCL 5 MG/ML IV SOLN
20.0000 mg | Freq: Once | INTRAVENOUS | Status: AC
  Administered 2020-06-12: 20 mg via INTRAVENOUS
  Filled 2020-06-12: qty 4

## 2020-06-12 MED ORDER — LABETALOL HCL 200 MG PO TABS
200.0000 mg | ORAL_TABLET | Freq: Two times a day (BID) | ORAL | Status: DC
  Administered 2020-06-13: 200 mg via ORAL
  Filled 2020-06-12: qty 1

## 2020-06-12 NOTE — ED Triage Notes (Addendum)
Pt reports that her PCP called her today with a creatinine of 3.35. Also presents very hypertensive. She states that it is not unusual for her to be in the 200s. She denies edema, chest pain, SOB, or headache at this time. No neuro deficits.

## 2020-06-12 NOTE — ED Provider Notes (Signed)
[facility] DEPT Provider Note   CSN: [id number] Arrival date & time: 06/12/20  1913     History Chief Complaint  Patient presents with  . Abnormal Lab    Robin Navarro is a 54 y.o. female.  HPI   Pt went to the doctors office today to check on her blood pressure.  While she was there it was elevated.  Pt also had her blood drawn.  She was called back and told to come to the ED because of her lab tests.  Pt denies chest pain or shortness of breath.  No trouble urinating. Pt states she has been taking her blood pressure medications.  She tries to not miss any doses but did miss a dose last Saturday.  Past Medical History:  Diagnosis Date  . Hypertension     Patient Active Problem List   Diagnosis Date Noted  . Acute bronchitis 02/11/2013  . Community acquired pneumonia 02/10/2013  . HTN (hypertension) 02/10/2013    History reviewed. No pertinent surgical history.   OB History    Gravida  1   Para  1   Term      Preterm      AB      Living        SAB      TAB      Ectopic      Multiple      Live Births              Family History  Problem Relation Age of Onset  . Diabetes Mother   . Hypertension Mother     Social History   Tobacco Use  . Smoking status: Current Every Day Smoker    Packs/day: 0.01  . Smokeless tobacco: Never Used  Substance Use Topics  . Alcohol use: Yes    Comment: 1 every six months  . Drug use: Yes    Types: Marijuana    Home Medications Prior to Admission medications   Medication Sig Start Date End Date Taking? Authorizing Provider  amLODipine (NORVASC) 10 MG tablet Take 1 tablet (10 mg total) by mouth daily. 02/07/17  Yes Junius Creamer, NP  albuterol (PROVENTIL HFA;VENTOLIN HFA) 108 (90 BASE) MCG/ACT inhaler Inhale 2 puffs into the lungs every 6 (six) hours as needed for wheezing. Patient not taking: Reported on 02/07/2017 02/12/13   Rai, Vernelle Emerald, MD  azithromycin (ZITHROMAX) 250  MG tablet Take 1 tablet (250 mg total) by mouth daily. Take first 2 tablets together, then 1 every day until finished. Patient not taking: Reported on 02/07/2017 01/06/14   Fredia Sorrow, MD  dextromethorphan-guaiFENesin [facility] DM) 30-600 MG per 12 hr tablet Take 1 tablet by mouth 2 (two) times daily. Patient not taking: Reported on 02/07/2017 01/06/14   Fredia Sorrow, MD  predniSONE (DELTASONE) 10 MG tablet Take 2 tablets (20 mg total) by mouth daily. 02/07/17   Junius Creamer, NP  triamterene-hydrochlorothiazide [facility]) 37.5-25 MG tablet Take 1 tablet by mouth every morning. 05/25/20   [provider]    Allergies    Patient has no known allergies.  Review of Systems   Review of Systems  All other systems reviewed and are negative.   Physical Exam Updated Vital Signs BP (!) 205/128   Pulse 83   Temp 98.6 F (37 C) (Oral)   Resp 20   Ht 1.499 m (4\' 11" )   Wt 88 kg   SpO2 98%   BMI 39.18 kg/m  Physical Exam Vitals and nursing note reviewed.  Constitutional:      General: She is not in acute distress.    Appearance: She is well-developed.  HENT:     Head: Normocephalic and atraumatic.     Right Ear: External ear normal.     Left Ear: External ear normal.  Eyes:     General: No scleral icterus.       Right eye: No discharge.        Left eye: No discharge.     Conjunctiva/sclera: Conjunctivae normal.  Neck:     Trachea: No tracheal deviation.  Cardiovascular:     Rate and Rhythm: Normal rate and regular rhythm.  Pulmonary:     Effort: Pulmonary effort is normal. No respiratory distress.     Breath sounds: Normal breath sounds. No stridor. No wheezing or rales.  Abdominal:     General: Bowel sounds are normal. There is no distension.     Palpations: Abdomen is soft.     Tenderness: There is no abdominal tenderness. There is no guarding or rebound.  Musculoskeletal:        General: No tenderness.     Cervical back: Neck supple.  Skin:    General: Skin is  warm and dry.     Findings: No rash.  Neurological:     Mental Status: She is alert.     Cranial Nerves: No cranial nerve deficit (no facial droop, extraocular movements intact, no slurred speech).     Sensory: No sensory deficit.     Motor: No abnormal muscle tone or seizure activity.     Coordination: Coordination normal.     ED Results / Procedures / Treatments   Labs (all labs ordered are listed, but only abnormal results are displayed) Labs Reviewed  CBC WITH DIFFERENTIAL/PLATELET - Abnormal; Notable for the following components:      Result Value   RBC 3.59 (*)    Hemoglobin 10.9 (*)    HCT 33.1 (*)    All other components within normal limits  BASIC METABOLIC PANEL - Abnormal; Notable for the following components:   Potassium 3.1 (*)    Chloride 95 (*)    Glucose, Bld 113 (*)    BUN 52 (*)    Creatinine, Ser 3.37 (*)    GFR calc non Af Amer 15 (*)    GFR calc Af Amer 17 (*)    All other components within normal limits  URINALYSIS, ROUTINE W REFLEX MICROSCOPIC - Abnormal; Notable for the following components:   APPearance HAZY (*)    Hgb urine dipstick MODERATE (*)    Protein, ur 100 (*)    Leukocytes,Ua MODERATE (*)    Bacteria, UA RARE (*)    All other components within normal limits    EKG None  Radiology No results found.  Procedures .Critical Care Performed by: Dorie Rank, MD Authorized by: Dorie Rank, MD   Critical care provider statement:    Critical care time (minutes):  45   Critical care was time spent personally by me on the following activities:  Discussions with consultants, evaluation of patient's response to treatment, examination of patient, ordering and performing treatments and interventions, ordering and review of laboratory studies, ordering and review of radiographic studies, pulse oximetry, re-evaluation of patient's condition, obtaining history from patient or surrogate and review of old charts   (including critical care  time)  Medications Ordered in ED Medications  labetalol (NORMODYNE) injection 20 mg (has no administration  in time range)  labetalol (NORMODYNE) injection 20 mg (20 mg Intravenous Given 06/12/20 2240)    ED Course  I have reviewed the triage vital signs and the nursing notes.  Pertinent labs & imaging results that were available during my care of the patient were reviewed by me and considered in my medical decision making (see chart for details).  Clinical Course as of Jun 13 2331  Fri Jun 12, 2020  2327 Patient remains hypertensive.  Laboratory test shows elevated creatinine of 3.37.   [VO]  5929 6 years ago the patient's creatinine was normal but we do not have any more recent labs   [JK]  2328 Patient does have proteinuria.   [JK]  2328 Anemia is new compared to previous values   [JK]    Clinical Course User Index [JK] Dorie Rank, MD   MDM Rules/Calculators/A&P                          Patient presents ED for evaluation of hypertension and abnormal renal function.  Patient states she takes her medications regularly but it sounds like she is not normally well controlled.  Patient typically has blood pressures that are elevated in the high 180s 190s.  She is more significantly elevated today.  Patient does have significant renal dysfunction.  Her previous labs are over 6 years ago.  Patient remains hypertensive despite labetalol.  I will order additional doses.  Patient is not having any chest pain or shortness of breath and is overall rather asymptomatic.  Suspect there is a significant chronic component to this that has unfortunately resulted in renal dysfunction.  I will consult the medical service for admission and further treatment. Final Clinical Impression(s) / ED Diagnoses Final diagnoses:  Hypertensive emergency  AKI (acute kidney injury) ([place])      Dorie Rank, MD 06/12/20 2332

## 2020-06-13 ENCOUNTER — Other Ambulatory Visit: Payer: Self-pay

## 2020-06-13 ENCOUNTER — Encounter (HOSPITAL_COMMUNITY): Payer: Self-pay | Admitting: Internal Medicine

## 2020-06-13 ENCOUNTER — Observation Stay (HOSPITAL_COMMUNITY): Payer: Self-pay

## 2020-06-13 DIAGNOSIS — N179 Acute kidney failure, unspecified: Secondary | ICD-10-CM

## 2020-06-13 DIAGNOSIS — I161 Hypertensive emergency: Principal | ICD-10-CM

## 2020-06-13 DIAGNOSIS — I160 Hypertensive urgency: Secondary | ICD-10-CM

## 2020-06-13 DIAGNOSIS — D649 Anemia, unspecified: Secondary | ICD-10-CM | POA: Diagnosis present

## 2020-06-13 LAB — BASIC METABOLIC PANEL
Anion gap: 15 (ref 5–15)
BUN: 53 mg/dL — ABNORMAL HIGH (ref 6–20)
CO2: 30 mmol/L (ref 22–32)
Calcium: 9.9 mg/dL (ref 8.9–10.3)
Chloride: 96 mmol/L — ABNORMAL LOW (ref 98–111)
Creatinine, Ser: 3.25 mg/dL — ABNORMAL HIGH (ref 0.44–1.00)
GFR calc Af Amer: 18 mL/min — ABNORMAL LOW (ref >60)
GFR calc non Af Amer: 16 mL/min — ABNORMAL LOW (ref >60)
Glucose, Bld: 150 mg/dL — ABNORMAL HIGH (ref 70–99)
Potassium: 2.7 mmol/L — CL (ref 3.5–5.1)
Sodium: 141 mmol/L (ref 135–145)

## 2020-06-13 LAB — CBC
HCT: 30.1 % — ABNORMAL LOW (ref 36.0–46.0)
Hemoglobin: 9.7 g/dL — ABNORMAL LOW (ref 12.0–15.0)
MCH: 29.8 pg (ref 26.0–34.0)
MCHC: 32.2 g/dL (ref 30.0–36.0)
MCV: 92.6 fL (ref 80.0–100.0)
Platelets: 188 10*3/uL (ref 150–400)
RBC: 3.25 MIL/uL — ABNORMAL LOW (ref 3.87–5.11)
RDW: 15.1 % (ref 11.5–15.5)
WBC: 7.8 10*3/uL (ref 4.0–10.5)
nRBC: 0 % (ref 0.0–0.2)

## 2020-06-13 LAB — RETICULOCYTES
Immature Retic Fract: 20.2 % — ABNORMAL HIGH (ref 2.3–15.9)
RBC.: 3.35 MIL/uL — ABNORMAL LOW (ref 3.87–5.11)
Retic Count, Absolute: 95.1 10*3/uL (ref 19.0–186.0)
Retic Ct Pct: 2.8 % (ref 0.4–3.1)

## 2020-06-13 LAB — IRON AND TIBC
Iron: 40 ug/dL (ref 28–170)
Saturation Ratios: 14 % (ref 10.4–31.8)
TIBC: 289 ug/dL (ref 250–450)
UIBC: 249 ug/dL

## 2020-06-13 LAB — FERRITIN: Ferritin: 229 ng/mL (ref 11–307)

## 2020-06-13 LAB — SARS CORONAVIRUS 2 BY RT PCR (HOSPITAL ORDER, PERFORMED IN ~~LOC~~ HOSPITAL LAB): SARS Coronavirus 2: NEGATIVE

## 2020-06-13 LAB — VITAMIN B12: Vitamin B-12: 258 pg/mL (ref 180–914)

## 2020-06-13 LAB — TSH: TSH: 1.716 u[IU]/mL (ref 0.350–4.500)

## 2020-06-13 LAB — MAGNESIUM: Magnesium: 2 mg/dL (ref 1.7–2.4)

## 2020-06-13 LAB — HIV ANTIBODY (ROUTINE TESTING W REFLEX): HIV Screen 4th Generation wRfx: NONREACTIVE

## 2020-06-13 LAB — FOLATE: Folate: 6.5 ng/mL (ref >5.9)

## 2020-06-13 LAB — POTASSIUM: Potassium: 2.8 mmol/L — ABNORMAL LOW (ref 3.5–5.1)

## 2020-06-13 IMAGING — US US RENAL
1 series · 14 of 25 positions shown · non-contrast
Comparison: None.

CLINICAL DATA: Initial evaluation for acute on chronic renal
failure.

EXAM:
RENAL / URINARY TRACT ULTRASOUND COMPLETE

[Series 1: us renal · 14 of 35 slices shown]
[im 1/35]
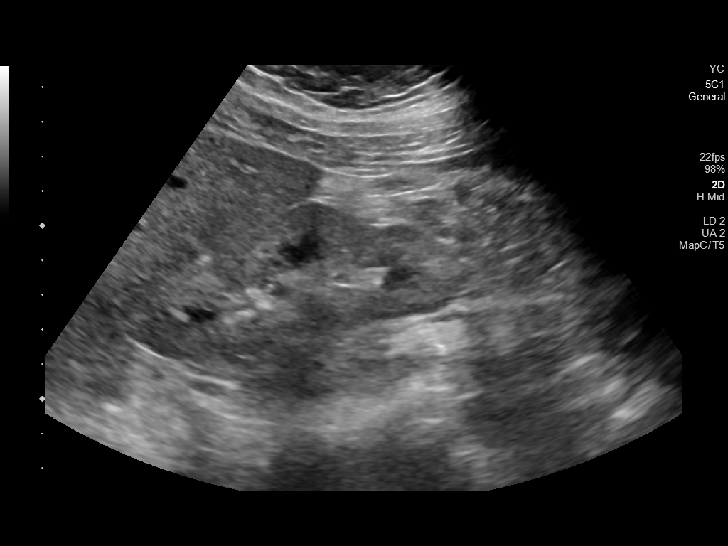
[im 3/35]
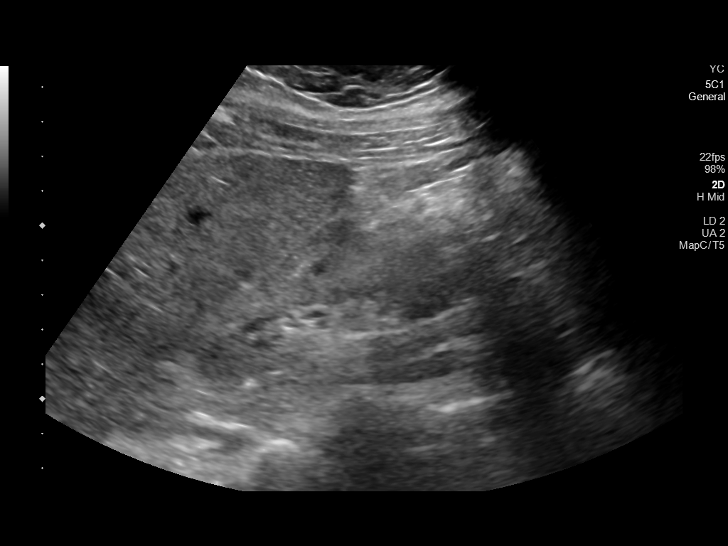
[im 6/35]
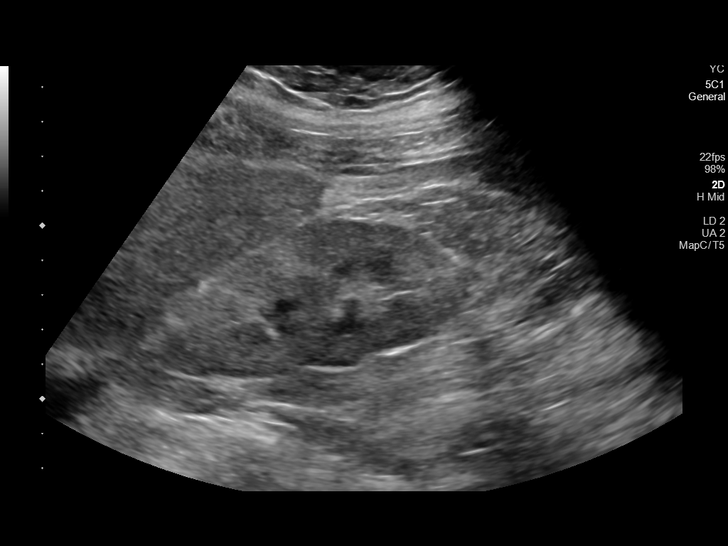
[im 9/35]
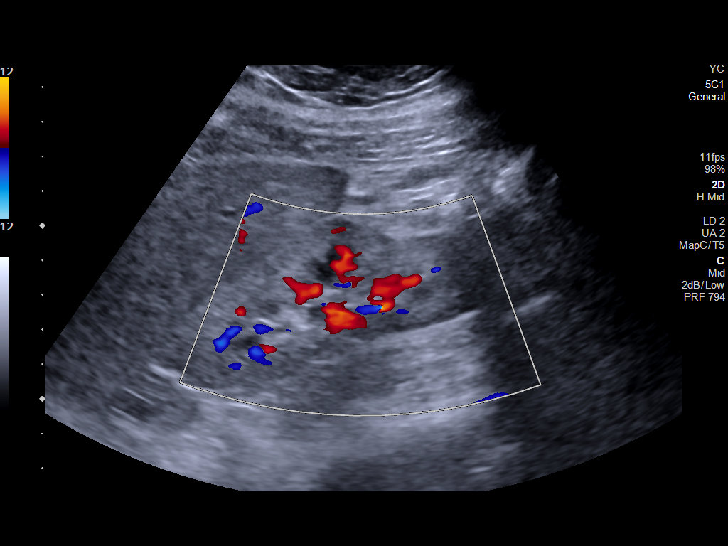
[im 12/35]
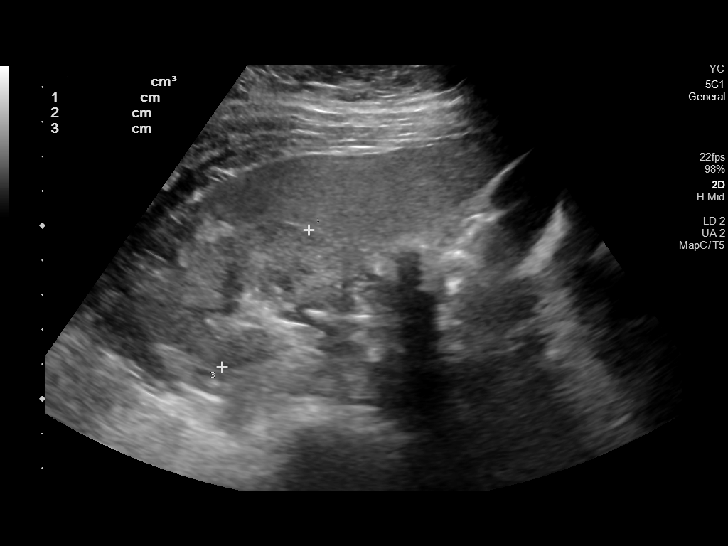
[im 13/35]
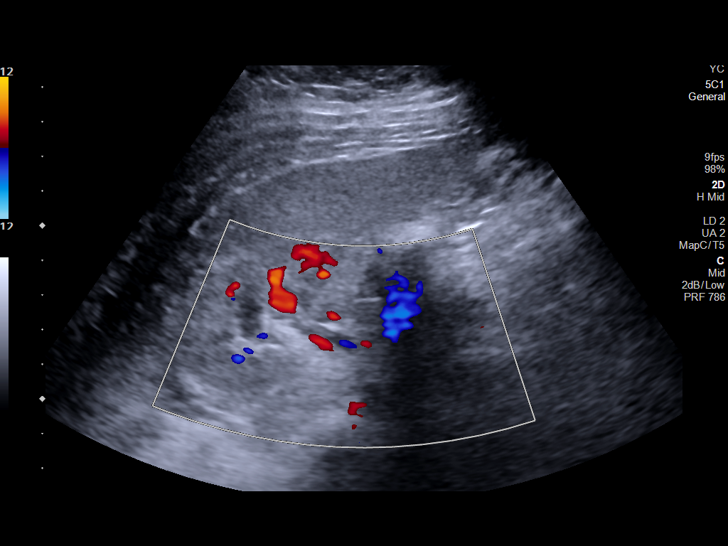
[im 16/35]
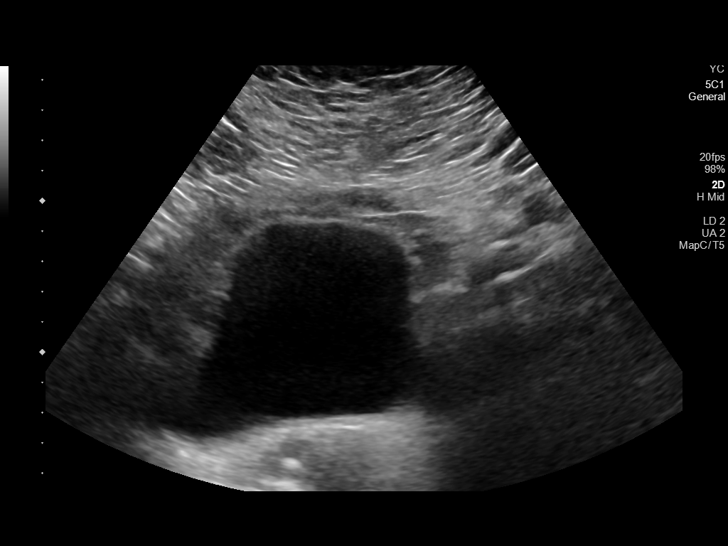
[im 19/35]
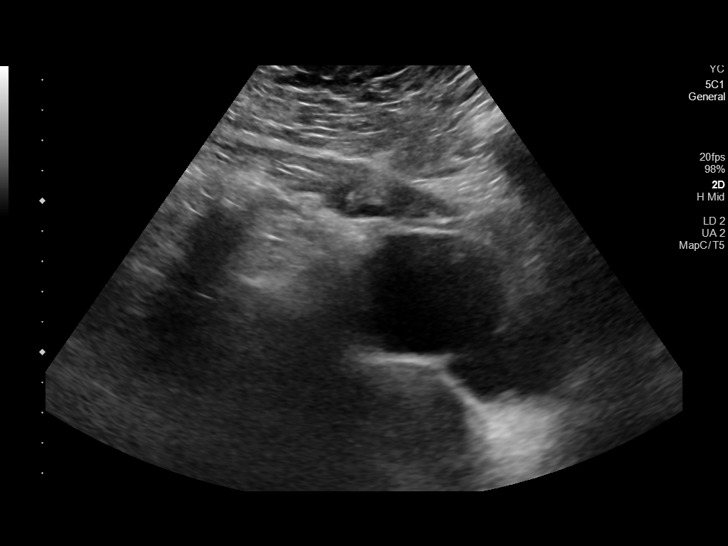
[im 22/35]
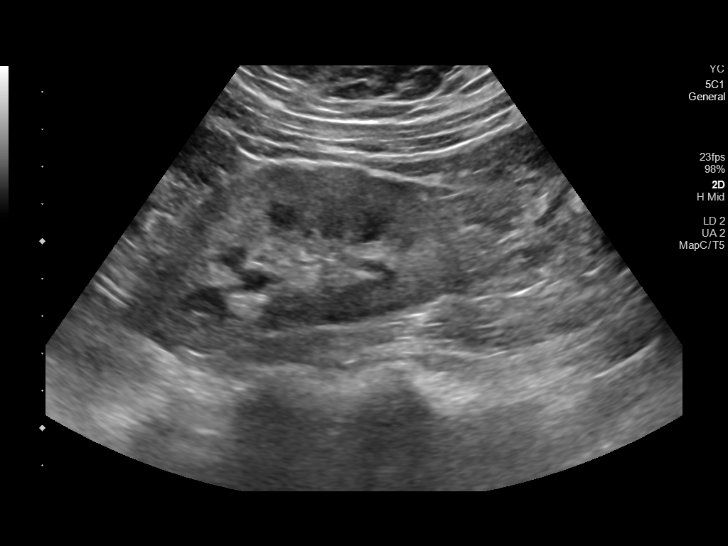
[im 23/35]
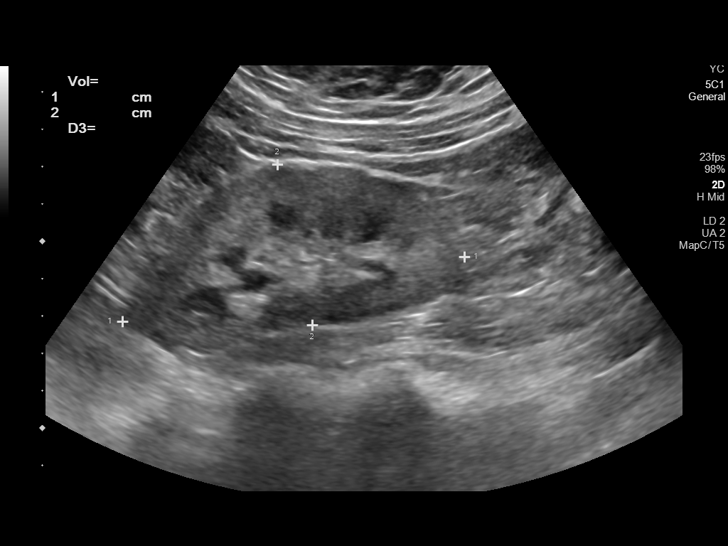
[im 26/35]
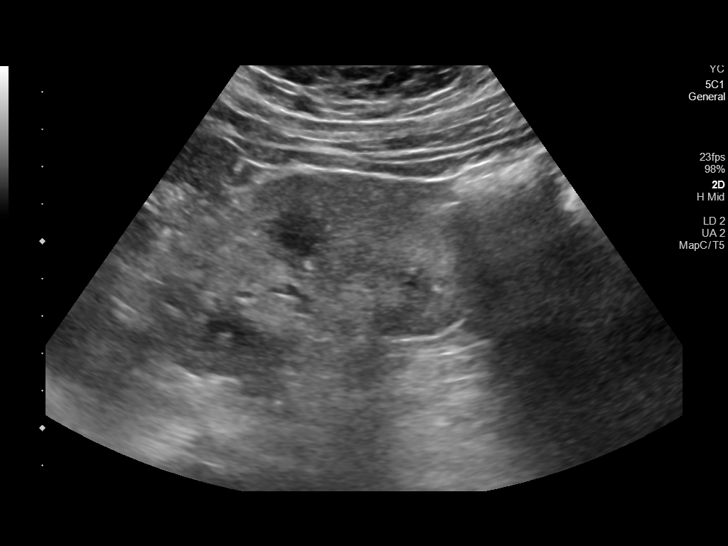
[im 29/35]
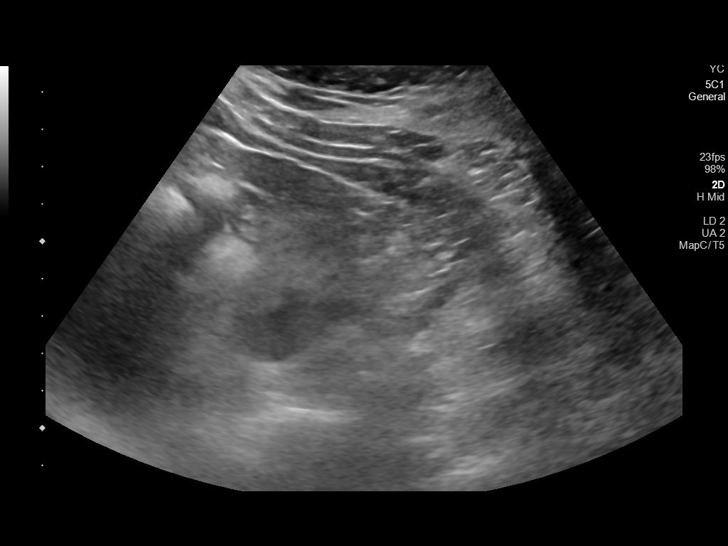
[im 32/35]
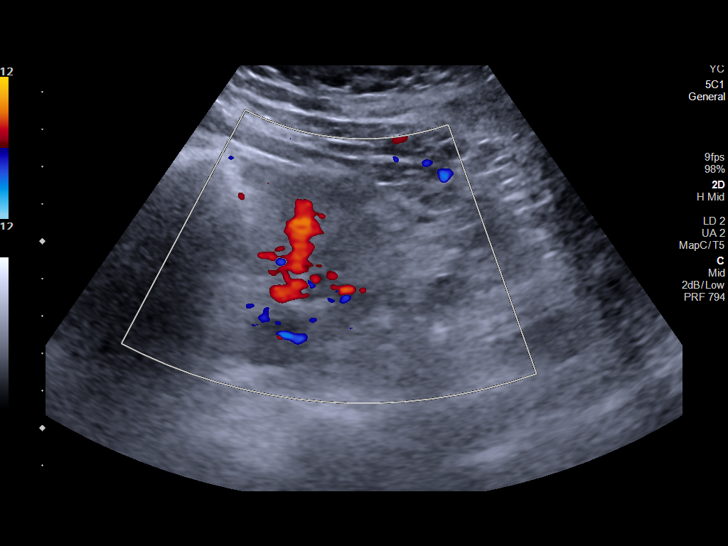
[im 35/35]
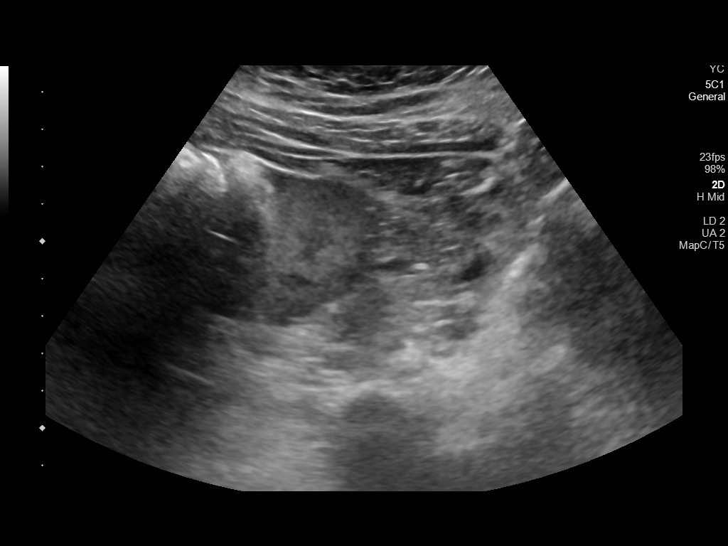

[14 of 25 positions shown; findings below may reference images not displayed]

FINDINGS: Right Kidney:

Renal measurements: 10.4 x 4.1 x 4.7 cm = volume: 104.7 mL.
Diffusely increased echogenicity within the renal parenchyma. No
nephrolithiasis or hydronephrosis. No focal renal mass.

Left Kidney:

Renal measurements: 9.3 x 4.4 x 4.4 cm = volume: 94.5 mL. Diffusely
increased echogenicity within the renal parenchyma. No
nephrolithiasis or hydronephrosis. No focal renal mass.

Bladder:

Appears normal for degree of bladder distention.

Other:

None.
IMPRESSION: 1. Diffusely increased echogenicity within the renal parenchyma,
compatible with medical renal disease.
2. No hydronephrosis or other significant finding.

## 2020-06-13 MED ORDER — ONDANSETRON HCL 4 MG/2ML IJ SOLN: 4.0000 mg | Freq: Four times a day (QID) | INTRAMUSCULAR | Status: DC | PRN

## 2020-06-13 MED ORDER — LABETALOL HCL 100 MG PO TABS
100.0000 mg | ORAL_TABLET | Freq: Two times a day (BID) | ORAL | Status: DC
  Administered 2020-06-13 – 2020-06-14 (×3): 100 mg via ORAL
  Filled 2020-06-13 (×4): qty 1

## 2020-06-13 MED ORDER — ONDANSETRON HCL 4 MG PO TABS: 4.0000 mg | ORAL_TABLET | Freq: Four times a day (QID) | ORAL | Status: DC | PRN

## 2020-06-13 MED ORDER — HYDRALAZINE HCL 20 MG/ML IJ SOLN
10.0000 mg | Freq: Once | INTRAMUSCULAR | Status: AC
  Administered 2020-06-13: 10 mg via INTRAVENOUS
  Filled 2020-06-13: qty 1

## 2020-06-13 MED ORDER — SODIUM CHLORIDE 0.9 % IV SOLN: INTRAVENOUS | Status: DC

## 2020-06-13 MED ORDER — POTASSIUM CHLORIDE CRYS ER 20 MEQ PO TBCR
30.0000 meq | EXTENDED_RELEASE_TABLET | Freq: Once | ORAL | Status: AC
  Administered 2020-06-13: 30 meq via ORAL
  Filled 2020-06-13: qty 1

## 2020-06-13 MED ORDER — AMLODIPINE BESYLATE 10 MG PO TABS
10.0000 mg | ORAL_TABLET | Freq: Every day | ORAL | Status: DC
  Administered 2020-06-13 – 2020-06-15 (×3): 10 mg via ORAL
  Filled 2020-06-13 (×2): qty 1
  Filled 2020-06-13: qty 2

## 2020-06-13 MED ORDER — HEPARIN SODIUM (PORCINE) 5000 UNIT/ML IJ SOLN
5000.0000 [IU] | Freq: Three times a day (TID) | INTRAMUSCULAR | Status: DC
  Administered 2020-06-13 – 2020-06-14 (×6): 5000 [IU] via SUBCUTANEOUS
  Filled 2020-06-13 (×7): qty 1

## 2020-06-13 MED ORDER — POTASSIUM CHLORIDE CRYS ER 20 MEQ PO TBCR
40.0000 meq | EXTENDED_RELEASE_TABLET | Freq: Once | ORAL | Status: AC
  Administered 2020-06-13: 40 meq via ORAL
  Filled 2020-06-13: qty 2

## 2020-06-13 NOTE — Plan of Care (Signed)
  Problem: Education: Goal: Knowledge of General Education information will improve Description: Including pain rating scale, medication(s)/side effects and non-pharmacologic comfort measures Outcome: Progressing   Problem: Clinical Measurements: Goal: Ability to maintain clinical measurements within normal limits will improve Outcome: Progressing   Problem: Clinical Measurements: Goal: Diagnostic test results will improve Outcome: Progressing   Problem: Coping: Goal: Level of anxiety will decrease Outcome: Progressing   Problem: Safety: Goal: Ability to remain free from injury will improve Outcome: Progressing

## 2020-06-13 NOTE — Progress Notes (Signed)
[facility]                                                                              Patient Demographics  Robin Navarro, is a 54 y.o. female, DOB - 08-05-1966, SKA:[id number]  Admit date - 06/12/2020   Admitting Physician Rise Patience, MD  Outpatient Primary MD for the patient is Pa, [place]  Outpatient specialists:   LOS - 0  days   Medical records reviewed and are as summarized below:    Chief Complaint  Patient presents with  . Abnormal Lab       Brief summary   HPI: Robin Navarro is a 54 y.o. female with history of hypertension who has not been taking her antihypertensives for more than a year because of insurance issues had recently started following up with a primary care physician 3 weeks ago and was placed on diuretics at labs done was found to have elevated creatinine and was advised to come to the ER.  Patient has/does not have any symptoms denies any chest pain shortness of breath nausea vomiting or diarrhea.  Denies taking any NSAIDs. ED Course: In the ER patient's creatinine is around 3.3 and the last one we have in our system was about 6 years ago which was normal.  Patient is also mildly anemic.  Patient is found to have markedly elevated blood pressure with systolic blood pressure more than 200.  Patient admitted for hypertensive urgency and worsening renal function.  EKG shows LVH and nonspecific ST changes.  Covid test was negative.   Assessment & Plan    Principal Problem:   Hypertensive urgency -Patient was placed on amlodipine, triamterene HCTZ outpatient 3 weeks ago.  Presented with hypertensive urgency with SBP in 200s and creatinine 3.3 at the time of admission -Discussed in detail with the patient regarding uncontrolled BP and high risk of chronic medical issues including CKD, CHF etc.  Patient has also history of chronic kidney disease in her family. -Placed on amlodipine, 10 mg daily, labetalol  100 mg twice a day -Discontinue triamterene HCTZ.  Active Problems:   AKI (acute kidney injury) ([place]), likely has underlying CKD unknown stage -Creatinine 3.3 at the time of admission -Renal ultrasound shows medical renal disease, negative for any hydronephrosis or obstruction -UA showed proteinuria, otherwise no UTI, obtain SPEP, UPEP, renin, aldosterone -TSH normal - will continue IV fluids today, reassess creatinine function in a.m. - likely will need to be closely followed by nephrology outpatient, will arrange at discharge    Normocytic anemia -Likely due to chronic disease, possible underlying CKD, anemia panel shows ferritin 229  Obesity Estimated body mass index is 39.18 kg/m as calculated from the following:   Height as of this encounter: 4\' 11"  (1.499 m).   Weight as of this encounter: 88 kg.  Counseled on diet and weight control  Code Status: Full CODE STATUS DVT Prophylaxis: Heparin subcu Family Communication: Discussed all imaging results, lab results, explained to the patient   Disposition Plan:     Status is: Observation  The patient remains OBS appropriate and will d/c before 2 midnights.  Dispo: The patient is from: Home              Anticipated d/c is to: Home              Anticipated d/c date is: 1 day              Patient currently is not medically stable to d/c.      Time Spent in minutes 35 minutes  Procedures:  Renal  ultrasound  Consultants:   None  Antimicrobials:   Anti-infectives (From admission, onward)   None          Medications  Scheduled Meds: . amLODipine  10 mg Oral Daily  . heparin  5,000 Units Subcutaneous Q8H  . labetalol  100 mg Oral BID  . potassium chloride  40 mEq Oral Once   Continuous Infusions: . sodium chloride 125 mL/hr at 06/13/20 0924   PRN Meds:.ondansetron **OR** ondansetron (ZOFRAN) IV      Subjective:   Analeese [name] was seen and examined today.  No acute complaints per patient, no  headache, chest pain, shortness of breath or blurry vision. Patient denies dizziness,  abdominal pain, N/V/D/C, new weakness, numbess, tingling. No acute events overnight.    Objective:   Vitals:   06/13/20 1100 06/13/20 1115 06/13/20 1130 06/13/20 1145  BP: (!) 131/96 137/82 137/90 (!) 149/99  Pulse:  75 77 76  Resp:  14 16 20   Temp:      TempSrc:      SpO2:  97% (!) 85% (!) 84%  Weight:      Height:       No intake or output data in the 24 hours ending 06/13/20 1206   Wt Readings from Last 3 Encounters:  06/12/20 88 kg  02/12/13 89.3 kg  02/09/12 87.5 kg     Exam  General: Alert and oriented x 3, NAD  Cardiovascular: S1 S2 auscultated, no murmurs, RRR  Respiratory: Clear to auscultation bilaterally, no wheezing, rales or rhonchi  Gastrointestinal: Soft, nontender, nondistended, + bowel sounds  Ext: no pedal edema bilaterally  Neuro: AAOx3, Cr N's II- XII. Strength 5/5 upper and lower extremities bilaterally  Musculoskeletal: No digital cyanosis, clubbing  Skin: No rashes  Psych: Normal affect and demeanor, alert and oriented x3    Data Reviewed:  I have personally reviewed following labs and imaging studies  Micro Results Recent Results (from the past 240 hour(s))  SARS Coronavirus 2 by RT PCR (hospital order, performed in [facility] hospital lab) Nasopharyngeal Nasopharyngeal Swab     Status: None   Collection Time: 06/13/20  1:30 AM   Specimen: Nasopharyngeal Swab  Result Value Ref Range Status   SARS Coronavirus 2 NEGATIVE NEGATIVE Final    Comment: (NOTE) SARS-CoV-2 target nucleic acids are NOT DETECTED.  The SARS-CoV-2 RNA is generally detectable in upper and lower respiratory specimens during the acute phase of infection. The lowest concentration of SARS-CoV-2 viral copies this assay can detect is 250 copies / mL. A negative result does not preclude SARS-CoV-2 infection and should not be used as the sole basis for treatment or other patient  management decisions.  A negative result may occur with improper specimen collection / handling, submission of specimen other than nasopharyngeal swab, presence of viral mutation(s) within the areas targeted by this assay, and inadequate number of viral copies (<250 copies / mL). A negative result must be combined with clinical observations, patient history, and epidemiological information.  Fact Sheet for Patients:  StrictlyIdeas.no  Fact Sheet for Healthcare Providers: BankingDealers.co.za  This test is not yet approved or  cleared by the [place] FDA and has been authorized for detection and/or diagnosis of SARS-CoV-2 by FDA under an Emergency Use Authorization (EUA).  This EUA will remain in effect (meaning this test can be used) for the duration of the COVID-19 declaration under Section 564(b)(1) of the Act, 21 U.S.C. section 360bbb-3(b)(1), unless the authorization is terminated or revoked sooner.  Performed at [facility], [place] [address] [place], [place] [address]     Radiology Reports US RENAL  Result Date: 06/13/2020 CLINICAL DATA:  Initial evaluation for acute on chronic renal failure. EXAM: RENAL / URINARY TRACT ULTRASOUND COMPLETE COMPARISON:  None. FINDINGS: Right Kidney: Renal measurements: 10.4 x 4.1 x 4.7 cm = volume: 104.7 mL. Diffusely increased echogenicity within the renal parenchyma. No nephrolithiasis or hydronephrosis. No focal renal mass. Left Kidney: Renal measurements: 9.3 x 4.4 x 4.4 cm = volume: 94.5 mL. Diffusely increased echogenicity within the renal parenchyma. No nephrolithiasis or hydronephrosis. No focal renal mass. Bladder: Appears normal for degree of bladder distention. Other: None. IMPRESSION: 1. Diffusely increased echogenicity within the renal parenchyma, compatible with medical renal disease. 2. No hydronephrosis or other significant finding. Electronically Signed   By:  Jeannine Boga M.D.   On: 06/13/2020 06:19    Lab Data:  CBC: Recent Labs  Lab 06/12/20 2230 06/13/20 0357  WBC 8.9 7.8  NEUTROABS 5.9  --   HGB 10.9* 9.7*  HCT 33.1* 30.1*  MCV 92.2 92.6  PLT 215 397   Basic Metabolic Panel: Recent Labs  Lab 06/12/20 2230 06/13/20 0357 06/13/20 0824  NA 139 141  --   K 3.1* 2.7* 2.8*  CL 95* 96*  --   CO2 30 30  --   GLUCOSE 113* 150*  --   BUN 52* 53*  --   CREATININE 3.37* 3.25*  --   CALCIUM 10.1 9.9  --   MG  --  2.0  --    GFR: Estimated Creatinine Clearance: 19.5 mL/min (A) (by C-G formula based on SCr of 3.25 mg/dL (H)). Liver Function Tests: No results for input(s): AST, ALT, ALKPHOS, BILITOT, PROT, ALBUMIN in the last 168 hours. No results for input(s): LIPASE, AMYLASE in the last 168 hours. No results for input(s): AMMONIA in the last 168 hours. Coagulation Profile: No results for input(s): INR, PROTIME in the last 168 hours. Cardiac Enzymes: No results for input(s): CKTOTAL, CKMB, CKMBINDEX, TROPONINI in the last 168 hours. BNP (last 3 results) No results for input(s): PROBNP in the last 8760 hours. HbA1C: No results for input(s): HGBA1C in the last 72 hours. CBG: No results for input(s): GLUCAP in the last 168 hours. Lipid Profile: No results for input(s): CHOL, HDL, LDLCALC, TRIG, CHOLHDL, LDLDIRECT in the last 72 hours. Thyroid Function Tests: Recent Labs    06/13/20 0825  TSH 1.716   Anemia Panel: Recent Labs    06/13/20 0825  VITAMINB12 258  FOLATE 6.5  FERRITIN 229  TIBC 289  IRON 40  RETICCTPCT 2.8   Urine analysis:    Component Value Date/Time   COLORURINE YELLOW 06/12/2020 2223   APPEARANCEUR HAZY (A) 06/12/2020 2223   LABSPEC 1.010 06/12/2020 2223   PHURINE 6.0 06/12/2020 2223   GLUCOSEU NEGATIVE 06/12/2020 2223   HGBUR MODERATE (A) 06/12/2020 2223   BILIRUBINUR NEGATIVE 06/12/2020 [place] 06/12/2020 2223   PROTEINUR 100 (A) 06/12/2020 2223  UROBILINOGEN 0.2  01/06/2014 1848   NITRITE NEGATIVE 06/12/2020 2223   LEUKOCYTESUR MODERATE (A) 06/12/2020 2223     Lilla Callejo M.D. [facility] 06/13/2020, 12:06 PM   Call night coverage person covering after 7pm

## 2020-06-13 NOTE — H&P (Signed)
History and Physical    Robin Navarro IEP:[id number] DOB: Sep 15, 1966 DOA: 06/12/2020  PCP: Pa, [place]  Patient coming from: Home.  Chief Complaint: Abnormal labs.  HPI: Robin Navarro is a 54 y.o. female with history of hypertension who has not been taking her antihypertensives for more than a year because of insurance issues had recently started following up with a primary care physician 3 weeks ago and was placed on diuretics at labs done was found to have elevated creatinine and was advised to come to the ER.  Patient has/does not have any symptoms denies any chest pain shortness of breath nausea vomiting or diarrhea.  Denies taking any NSAIDs.  ED Course: In the ER patient's creatinine is around 3.3 and the last one we have in our system was about 6 years ago which was normal.  Patient is also mildly anemic.  Patient is found to have markedly elevated blood pressure with systolic blood pressure more than 200.  Patient admitted for hypertensive urgency and worsening renal function.  EKG shows LVH and nonspecific ST changes.  Covid test was negative.  Review of Systems: As per HPI, rest all negative.   Past Medical History:  Diagnosis Date  . Hypertension     History reviewed. No pertinent surgical history.   reports that she has been smoking cigars. She has been smoking about 0.01 packs per day. She has never used smokeless tobacco. She reports current alcohol use. She reports current drug use. Drug: Marijuana.  No Known Allergies  Family History  Problem Relation Age of Onset  . Diabetes Mother   . Hypertension Mother     Prior to Admission medications   Medication Sig Start Date End Date Taking? Authorizing Provider  amLODipine (NORVASC) 10 MG tablet Take 1 tablet (10 mg total) by mouth daily. 02/07/17  Yes Junius Creamer, NP  triamterene-hydrochlorothiazide (MAXZIDE-25) 37.5-25 MG tablet Take 1 tablet by mouth every morning. 05/25/20  Yes  [provider]  albuterol (PROVENTIL HFA;VENTOLIN HFA) 108 (90 BASE) MCG/ACT inhaler Inhale 2 puffs into the lungs every 6 (six) hours as needed for wheezing. Patient not taking: Reported on 02/07/2017 02/12/13   Rai, Vernelle Emerald, MD  azithromycin (ZITHROMAX) 250 MG tablet Take 1 tablet (250 mg total) by mouth daily. Take first 2 tablets together, then 1 every day until finished. Patient not taking: Reported on 02/07/2017 01/06/14   Fredia Sorrow, MD  dextromethorphan-guaiFENesin [facility] DM) 30-600 MG per 12 hr tablet Take 1 tablet by mouth 2 (two) times daily. Patient not taking: Reported on 02/07/2017 01/06/14   Fredia Sorrow, MD  predniSONE (DELTASONE) 10 MG tablet Take 2 tablets (20 mg total) by mouth daily. 02/07/17   Junius Creamer, NP    Physical Exam: Constitutional: Moderately built and nourished. Vitals:   06/13/20 0052 06/13/20 0155 06/13/20 0200 06/13/20 0315  BP: (!) 222/135 (!) 161/109 (!) 166/108 134/85  Pulse: 80 84 83 71  Resp: 17 (!) 24 17 19   Temp:      TempSrc:      SpO2: 98% 96% 95% 93%  Weight:      Height:       Eyes: Anicteric no pallor. ENMT: No discharge from the ears eyes nose or mouth. Neck: No mass felt.  No neck rigidity. Respiratory: No rhonchi or crepitations. Cardiovascular: S1-S2 heard. Abdomen: Soft nontender bowel sounds present. Musculoskeletal: No edema. Skin: No rash. Neurologic: Alert awake oriented to time place and person.  Moves all extremities.  Psychiatric: Appears normal.  Normal affect.   Labs on Admission: I have personally reviewed following labs and imaging studies  CBC: Recent Labs  Lab 06/12/20 2230  WBC 8.9  NEUTROABS 5.9  HGB 10.9*  HCT 33.1*  MCV 92.2  PLT 062   Basic Metabolic Panel: Recent Labs  Lab 06/12/20 2230  NA 139  K 3.1*  CL 95*  CO2 30  GLUCOSE 113*  BUN 52*  CREATININE 3.37*  CALCIUM 10.1   GFR: Estimated Creatinine Clearance: 18.8 mL/min (A) (by C-G formula based on SCr of 3.37 mg/dL  (H)). Liver Function Tests: No results for input(s): AST, ALT, ALKPHOS, BILITOT, PROT, ALBUMIN in the last 168 hours. No results for input(s): LIPASE, AMYLASE in the last 168 hours. No results for input(s): AMMONIA in the last 168 hours. Coagulation Profile: No results for input(s): INR, PROTIME in the last 168 hours. Cardiac Enzymes: No results for input(s): CKTOTAL, CKMB, CKMBINDEX, TROPONINI in the last 168 hours. BNP (last 3 results) No results for input(s): PROBNP in the last 8760 hours. HbA1C: No results for input(s): HGBA1C in the last 72 hours. CBG: No results for input(s): GLUCAP in the last 168 hours. Lipid Profile: No results for input(s): CHOL, HDL, LDLCALC, TRIG, CHOLHDL, LDLDIRECT in the last 72 hours. Thyroid Function Tests: No results for input(s): TSH, T4TOTAL, FREET4, T3FREE, THYROIDAB in the last 72 hours. Anemia Panel: No results for input(s): VITAMINB12, FOLATE, FERRITIN, TIBC, IRON, RETICCTPCT in the last 72 hours. Urine analysis:    Component Value Date/Time   COLORURINE YELLOW 06/12/2020 2223   APPEARANCEUR HAZY (A) 06/12/2020 2223   LABSPEC 1.010 06/12/2020 2223   PHURINE 6.0 06/12/2020 2223   GLUCOSEU NEGATIVE 06/12/2020 2223   HGBUR MODERATE (A) 06/12/2020 2223   BILIRUBINUR NEGATIVE 06/12/2020 2223   KETONESUR NEGATIVE 06/12/2020 2223   PROTEINUR 100 (A) 06/12/2020 2223   UROBILINOGEN 0.2 01/06/2014 1848   NITRITE NEGATIVE 06/12/2020 2223   LEUKOCYTESUR MODERATE (A) 06/12/2020 2223   Sepsis Labs: @LABRCNTIP (procalcitonin:4,lacticidven:4) ) Recent Results (from the past 240 hour(s))  SARS Coronavirus 2 by RT PCR (hospital order, performed in [place] hospital lab) Nasopharyngeal Nasopharyngeal Swab     Status: None   Collection Time: 06/13/20  1:30 AM   Specimen: Nasopharyngeal Swab  Result Value Ref Range Status   SARS Coronavirus 2 NEGATIVE NEGATIVE Final    Comment: (NOTE) SARS-CoV-2 target nucleic acids are NOT DETECTED.  The  SARS-CoV-2 RNA is generally detectable in upper and lower respiratory specimens during the acute phase of infection. The lowest concentration of SARS-CoV-2 viral copies this assay can detect is 250 copies / mL. A negative result does not preclude SARS-CoV-2 infection and should not be used as the sole basis for treatment or other patient management decisions.  A negative result may occur with improper specimen collection / handling, submission of specimen other than nasopharyngeal swab, presence of viral mutation(s) within the areas targeted by this assay, and inadequate number of viral copies (<250 copies / mL). A negative result must be combined with clinical observations, patient history, and epidemiological information.  Fact Sheet for Patients:   StrictlyIdeas.no  Fact Sheet for Healthcare Providers: BankingDealers.co.za  This test is not yet approved or  cleared by the [place] FDA and has been authorized for detection and/or diagnosis of SARS-CoV-2 by FDA under an Emergency Use Authorization (EUA).  This EUA will remain in effect (meaning this test can be used) for the duration of the COVID-19 declaration under Section 564(b)(1)  of the Act, 21 U.S.C. section 360bbb-3(b)(1), unless the authorization is terminated or revoked sooner.  Performed at [facility], [place] [address], [place], [place] [address]      Radiological Exams on Admission: No results found.  EKG: Independently reviewed.  Normal sinus rhythm with LVH.  Assessment/Plan Principal Problem:   Hypertensive urgency Active Problems:   AKI (acute kidney injury) ([place])   Normocytic anemia    1. Hypertensive urgency -patient has been placed on amlodipine and labetalol.  As needed IV hydralazine for systolic more than 525.  Closely follow blood pressure trends. 2. Probably acute on chronic kidney disease stage III likely from uncontrolled blood  pressure.  No new labs when I was about 6 years ago.  Urine does show some mild proteins.  Will check renal sonogram.  Check SPEP.  Follow metabolic panel. 3. Normocytic normochromic anemia could be from renal disease.  Check anemia panel and CBC with next blood draw.   DVT prophylaxis: Heparin. Code Status: Full code. Family Communication: Patient's son at the bedside. Disposition Plan: Home. Consults called: None. Admission status: Observation.   Rise Patience MD [facility] Pager [phone].  If 7PM-7AM, please contact night-coverage [email or web address] Password [facility]  06/13/2020, 3:23 AM

## 2020-06-13 NOTE — ED Notes (Signed)
Started 24 hour urine collection at 0900 am

## 2020-06-14 LAB — BASIC METABOLIC PANEL
Anion gap: 11 (ref 5–15)
BUN: 47 mg/dL — ABNORMAL HIGH (ref 6–20)
CO2: 28 mmol/L (ref 22–32)
Calcium: 9.6 mg/dL (ref 8.9–10.3)
Chloride: 102 mmol/L (ref 98–111)
Creatinine, Ser: 2.94 mg/dL — ABNORMAL HIGH (ref 0.44–1.00)
GFR calc Af Amer: 20 mL/min — ABNORMAL LOW (ref >60)
GFR calc non Af Amer: 18 mL/min — ABNORMAL LOW (ref >60)
Glucose, Bld: 101 mg/dL — ABNORMAL HIGH (ref 70–99)
Potassium: 3 mmol/L — ABNORMAL LOW (ref 3.5–5.1)
Sodium: 141 mmol/L (ref 135–145)

## 2020-06-14 MED ORDER — LABETALOL HCL 100 MG PO TABS
100.0000 mg | ORAL_TABLET | Freq: Once | ORAL | Status: AC
  Administered 2020-06-14: 100 mg via ORAL
  Filled 2020-06-14: qty 1

## 2020-06-14 MED ORDER — HYDRALAZINE HCL 20 MG/ML IJ SOLN
10.0000 mg | Freq: Four times a day (QID) | INTRAMUSCULAR | Status: DC | PRN
  Administered 2020-06-14: 10 mg via INTRAVENOUS
  Filled 2020-06-14 (×2): qty 1

## 2020-06-14 MED ORDER — CLONIDINE HCL 0.1 MG PO TABS
0.2000 mg | ORAL_TABLET | Freq: Once | ORAL | Status: AC
  Administered 2020-06-14: 0.2 mg via ORAL
  Filled 2020-06-14: qty 2

## 2020-06-14 MED ORDER — POTASSIUM CHLORIDE CRYS ER 20 MEQ PO TBCR
40.0000 meq | EXTENDED_RELEASE_TABLET | Freq: Once | ORAL | Status: AC
  Administered 2020-06-14: 40 meq via ORAL
  Filled 2020-06-14: qty 2

## 2020-06-14 MED ORDER — LABETALOL HCL 100 MG PO TABS
200.0000 mg | ORAL_TABLET | Freq: Two times a day (BID) | ORAL | Status: DC
  Administered 2020-06-14 – 2020-06-15 (×2): 200 mg via ORAL
  Filled 2020-06-14 (×2): qty 2

## 2020-06-14 NOTE — Plan of Care (Signed)

## 2020-06-14 NOTE — Progress Notes (Signed)
Patient's BP still high, 181/114. PRN IV hydralazine 10 mg administered with no effect. BP recheck and was 191/112. Patient does not want any IV med or IV line after her IV site was leaking. She denies any pain at IV site, no swelling or redness noted . She was also upset about the Dinamap squeezing her arm during BP checks. She stated "I think I will take my chances". Pt educated on the need to comply with treatment to get BP under control.

## 2020-06-14 NOTE — Progress Notes (Addendum)
[facility]                                                                              Patient Demographics  Robin Navarro, is a 54 y.o. female, DOB - [birth year], EFE:[id number]  Admit date - 06/12/2020   Admitting Physician Rise Patience, MD  Outpatient Primary MD for the patient is Pa, [place]  Outpatient specialists:   LOS - 1  days   Medical records reviewed and are as summarized below:    Chief Complaint  Patient presents with  . Abnormal Lab       Brief summary   HPI: Robin Navarro is a 54 y.o. female with history of hypertension who has not been taking her antihypertensives for more than a year because of insurance issues had recently started following up with a primary care physician 3 weeks ago and was placed on diuretics at labs done was found to have elevated creatinine and was advised to come to the ER.  Patient has/does not have any symptoms denies any chest pain shortness of breath nausea vomiting or diarrhea.  Denies taking any NSAIDs. ED Course: In the ER patient's creatinine is around 3.3 and the last one we have in our system was about 6 years ago which was normal.  Patient is also mildly anemic.  Patient is found to have markedly elevated blood pressure with systolic blood pressure more than 200.  Patient admitted for hypertensive urgency and worsening renal function.  EKG shows LVH and nonspecific ST changes.  Covid test was negative.   Assessment & Plan    Principal Problem:   Hypertensive urgency -Patient was placed on amlodipine, triamterene HCTZ outpatient 3 weeks ago.  Presented with hypertensive urgency with SBP in 200s and creatinine 3.3 at the time of admission -Discussed in detail with the patient regarding uncontrolled BP and high risk of chronic medical issues including CKD, CHF etc.  Patient has also history of chronic kidney disease in her family. -BP still uncontrolled, continue Norvasc 10  mg daily, increase labetalol to 200mg  BID, will add hydralazine PO if still elevated -Discontinued triamterene HCTZ.  Active Problems:   AKI (acute kidney injury) ([place]), likely has underlying CKD unknown stage -Creatinine 3.3 at the time of admission -Renal ultrasound shows medical renal disease, negative for any hydronephrosis or obstruction -UA showed proteinuria, otherwise no UTI, obtain SPEP, UPEP, renin, aldosterone -TSH normal -Continue IV fluid hydration, creatinine improving, 2.9 today - likely will need to be closely followed by nephrology outpatient, will arrange at discharge    Normocytic anemia -Likely due to chronic disease, possible underlying CKD, anemia panel shows ferritin 229  Hypokalemia -Potassium 3.0, replaced  Obesity Estimated body mass index is 39.18 kg/m as calculated from the following:   Height as of this encounter: 4\' 11"  (1.499 m).   Weight as of this encounter: 88 kg.  Counseled on diet and weight control  Code Status: Full CODE STATUS DVT Prophylaxis: Heparin subcu Family Communication: Discussed all imaging results, lab results, explained to the patient   Disposition Plan:     Status is: Inpatient  The patient  will require care spanning > 2 midnights and should be moved to inpatient because: Inpatient level of care appropriate due to severity of illness  Dispo: The patient is from: Home              Anticipated d/c is to: Home              Anticipated d/c date is: 1 day              Patient currently is not medically stable to d/c.  Hopefully DC home tomorrow if creatinine continues to improve      Time Spent in minutes 25 minutes  Procedures:  Renal  ultrasound  Consultants:   None  Antimicrobials:   Anti-infectives (From admission, onward)   None         Medications  Scheduled Meds: . amLODipine  10 mg Oral Daily  . heparin  5,000 Units Subcutaneous Q8H  . labetalol  200 mg Oral BID   Continuous Infusions: . sodium  chloride 125 mL/hr at 06/14/20 1000   PRN Meds:.hydrALAZINE, ondansetron **OR** ondansetron (ZOFRAN) IV      Subjective:   Robin Navarro was seen and examined today.  No acute complaints, tolerating diet, BP still uncontrolled, Patient denies dizziness,  abdominal pain, N/V/D/C, new weakness, numbess, tingling. No acute events overnight.    Objective:   Vitals:   06/14/20 0118 06/14/20 0529 06/14/20 0926 06/14/20 0951  BP: (!) 166/103 (!) 198/108 (!) 167/106 (!) 164/105  Pulse: 75 73 78 80  Resp: 15 16    Temp: 98.4 F (36.9 C) 98.6 F (37 C)    TempSrc: Oral Oral    SpO2: 100% 96%    Weight:      Height:        Intake/Output Summary (Last 24 hours) at 06/14/2020 1310 Last data filed at 06/14/2020 1000 Gross per 24 hour  Intake 1979.28 ml  Output --  Net 1979.28 ml     Wt Readings from Last 3 Encounters:  06/12/20 88 kg  02/12/13 89.3 kg  02/09/12 87.5 kg   Physical Exam  General: Alert and oriented x 3, NAD  Cardiovascular: S1 S2 clear, RRR. No pedal edema b/l  Respiratory: CTAB, no wheezing, rales or rhonchi  Gastrointestinal: Soft, nontender, nondistended, NBS  Ext: no pedal edema bilaterally  Neuro: no new deficits  Musculoskeletal: No cyanosis, clubbing  Skin: No rashes  Psych: Normal affect and demeanor, alert and oriented x3       Data Reviewed:  I have personally reviewed following labs and imaging studies  Micro Results Recent Results (from the past 240 hour(s))  SARS Coronavirus 2 by RT PCR (hospital order, performed in [place] hospital lab) Nasopharyngeal Nasopharyngeal Swab     Status: None   Collection Time: 06/13/20  1:30 AM   Specimen: Nasopharyngeal Swab  Result Value Ref Range Status   SARS Coronavirus 2 NEGATIVE NEGATIVE Final    Comment: (NOTE) SARS-CoV-2 target nucleic acids are NOT DETECTED.  The SARS-CoV-2 RNA is generally detectable in upper and lower respiratory specimens during the acute phase of infection.  The lowest concentration of SARS-CoV-2 viral copies this assay can detect is 250 copies / mL. A negative result does not preclude SARS-CoV-2 infection and should not be used as the sole basis for treatment or other patient management decisions.  A negative result may occur with improper specimen collection / handling, submission of specimen other than nasopharyngeal swab, presence of viral mutation(s) within the areas  targeted by this assay, and inadequate number of viral copies (<250 copies / mL). A negative result must be combined with clinical observations, patient history, and epidemiological information.  Fact Sheet for Patients:   StrictlyIdeas.no  Fact Sheet for Healthcare Providers: BankingDealers.co.za  This test is not yet approved or  cleared by the [place] FDA and has been authorized for detection and/or diagnosis of SARS-CoV-2 by FDA under an Emergency Use Authorization (EUA).  This EUA will remain in effect (meaning this test can be used) for the duration of the COVID-19 declaration under Section 564(b)(1) of the Act, 21 U.S.C. section 360bbb-3(b)(1), unless the authorization is terminated or revoked sooner.  Performed at [facility], [place] [address], [place], [place] [address]     Radiology Reports US RENAL  Result Date: 06/13/2020 CLINICAL DATA:  Initial evaluation for acute on chronic renal failure. EXAM: RENAL / URINARY TRACT ULTRASOUND COMPLETE COMPARISON:  None. FINDINGS: Right Kidney: Renal measurements: 10.4 x 4.1 x 4.7 cm = volume: 104.7 mL. Diffusely increased echogenicity within the renal parenchyma. No nephrolithiasis or hydronephrosis. No focal renal mass. Left Kidney: Renal measurements: 9.3 x 4.4 x 4.4 cm = volume: 94.5 mL. Diffusely increased echogenicity within the renal parenchyma. No nephrolithiasis or hydronephrosis. No focal renal mass. Bladder: Appears normal for degree of  bladder distention. Other: None. IMPRESSION: 1. Diffusely increased echogenicity within the renal parenchyma, compatible with medical renal disease. 2. No hydronephrosis or other significant finding. Electronically Signed   By: Jeannine Boga M.D.   On: 06/13/2020 06:19    Lab Data:  CBC: Recent Labs  Lab 06/12/20 2230 06/13/20 0357  WBC 8.9 7.8  NEUTROABS 5.9  --   HGB 10.9* 9.7*  HCT 33.1* 30.1*  MCV 92.2 92.6  PLT 215 124   Basic Metabolic Panel: Recent Labs  Lab 06/12/20 2230 06/13/20 0357 06/13/20 0824 06/14/20 0314  NA 139 141  --  141  K 3.1* 2.7* 2.8* 3.0*  CL 95* 96*  --  102  CO2 30 30  --  28  GLUCOSE 113* 150*  --  101*  BUN 52* 53*  --  47*  CREATININE 3.37* 3.25*  --  2.94*  CALCIUM 10.1 9.9  --  9.6  MG  --  2.0  --   --    GFR: Estimated Creatinine Clearance: 21.6 mL/min (A) (by C-G formula based on SCr of 2.94 mg/dL (H)). Liver Function Tests: No results for input(s): AST, ALT, ALKPHOS, BILITOT, PROT, ALBUMIN in the last 168 hours. No results for input(s): LIPASE, AMYLASE in the last 168 hours. No results for input(s): AMMONIA in the last 168 hours. Coagulation Profile: No results for input(s): INR, PROTIME in the last 168 hours. Cardiac Enzymes: No results for input(s): CKTOTAL, CKMB, CKMBINDEX, TROPONINI in the last 168 hours. BNP (last 3 results) No results for input(s): PROBNP in the last 8760 hours. HbA1C: No results for input(s): HGBA1C in the last 72 hours. CBG: No results for input(s): GLUCAP in the last 168 hours. Lipid Profile: No results for input(s): CHOL, HDL, LDLCALC, TRIG, CHOLHDL, LDLDIRECT in the last 72 hours. Thyroid Function Tests: Recent Labs    06/13/20 0825  TSH 1.716   Anemia Panel: Recent Labs    06/13/20 0825  VITAMINB12 258  FOLATE 6.5  FERRITIN 229  TIBC 289  IRON 40  RETICCTPCT 2.8   Urine analysis:    Component Value Date/Time   COLORURINE YELLOW 06/12/2020 2223   APPEARANCEUR HAZY (A)  06/12/2020 2223   LABSPEC 1.010 06/12/2020 2223   PHURINE 6.0 06/12/2020 2223   GLUCOSEU NEGATIVE 06/12/2020 2223   HGBUR MODERATE (A) 06/12/2020 2223   BILIRUBINUR NEGATIVE 06/12/2020 2223   KETONESUR NEGATIVE 06/12/2020 2223   PROTEINUR 100 (A) 06/12/2020 2223   UROBILINOGEN 0.2 01/06/2014 1848   NITRITE NEGATIVE 06/12/2020 2223   LEUKOCYTESUR MODERATE (A) 06/12/2020 2223     Charlee Squibb M.D. [facility] 06/14/2020, 1:10 PM   Call night coverage person covering after 7pm

## 2020-06-15 LAB — BASIC METABOLIC PANEL
Anion gap: 10 (ref 5–15)
BUN: 40 mg/dL — ABNORMAL HIGH (ref 6–20)
CO2: 27 mmol/L (ref 22–32)
Calcium: 9.4 mg/dL (ref 8.9–10.3)
Chloride: 102 mmol/L (ref 98–111)
Creatinine, Ser: 2.65 mg/dL — ABNORMAL HIGH (ref 0.44–1.00)
GFR calc Af Amer: 23 mL/min — ABNORMAL LOW (ref >60)
GFR calc non Af Amer: 20 mL/min — ABNORMAL LOW (ref >60)
Glucose, Bld: 112 mg/dL — ABNORMAL HIGH (ref 70–99)
Potassium: 3 mmol/L — ABNORMAL LOW (ref 3.5–5.1)
Sodium: 139 mmol/L (ref 135–145)

## 2020-06-15 MED ORDER — AMLODIPINE BESYLATE 10 MG PO TABS: 10.0000 mg | ORAL_TABLET | Freq: Every day | ORAL | 3 refills | Status: DC

## 2020-06-15 MED ORDER — HYDRALAZINE HCL 25 MG PO TABS: 25.0000 mg | ORAL_TABLET | Freq: Three times a day (TID) | ORAL | 3 refills | Status: DC

## 2020-06-15 MED ORDER — HYDRALAZINE HCL 10 MG PO TABS
10.0000 mg | ORAL_TABLET | Freq: Three times a day (TID) | ORAL | Status: DC
  Administered 2020-06-15: 10 mg via ORAL
  Filled 2020-06-15: qty 1

## 2020-06-15 MED ORDER — HYDRALAZINE HCL 25 MG PO TABS: 25.0000 mg | ORAL_TABLET | Freq: Three times a day (TID) | ORAL | Status: DC

## 2020-06-15 MED ORDER — POTASSIUM CHLORIDE CRYS ER 20 MEQ PO TBCR
40.0000 meq | EXTENDED_RELEASE_TABLET | Freq: Once | ORAL | Status: AC
  Administered 2020-06-15: 40 meq via ORAL
  Filled 2020-06-15: qty 2

## 2020-06-15 MED ORDER — LABETALOL HCL 200 MG PO TABS
200.0000 mg | ORAL_TABLET | Freq: Two times a day (BID) | ORAL | 3 refills | Status: DC
Start: 2020-06-15 — End: 2020-07-22

## 2020-06-15 NOTE — TOC Transition Note (Signed)
Transition of Care [facility]) - CM/SW Discharge Note   Patient Details  Name: Robin Navarro MRN: [id number] Date of Birth: 03-31-1966  [facility] CM/SW Contact:  Lia Hopping, [place] Phone Number: 06/15/2020, 9:47 AM   Clinical Narrative:    CSW inquired about pcp/medication needs. Patient reports she has a primary care physician (Dr. Marrow) at [facility]. Patient reports she purchases her medications at [facility].  No further needs identified.   Final next level of care: Home/Self Care Barriers to Discharge: No Barriers Identified   Patient Goals and CMS Choice     Choice offered to / list presented to : NA  Discharge Placement                       Discharge Plan and Services                DME Arranged: N/A DME Agency: NA         HH Agency: NA        Social Determinants of Health (SDOH) Interventions     Readmission Risk Interventions No flowsheet data found.

## 2020-06-15 NOTE — Discharge Summary (Signed)
Physician Discharge Summary   Patient ID: Robin Navarro MRN: [id number] DOB/AGE: Aug 04, 1966 54 y.o.  Admit date: 06/12/2020 Discharge date: 06/15/2020  Primary Care Physician:  [facility]   Recommendations for Outpatient Follow-up:  1. Follow up with PCP in 1-2 weeks 2. Please obtain BMP/CBC in one week  3. Recommend outpatient referral to nephrology, likely has underlying CKD  Home Health: None Equipment/Devices:   Discharge Condition: stable  CODE STATUS: FULL   Diet recommendation: Heart healthy diet   Discharge Diagnoses:    . Hypertensive urgency . Normocytic anemia . Acute kidney injury ([place]) Hypokalemia Obesity   Consults: None    Allergies:  No Known Allergies   DISCHARGE MEDICATIONS: Allergies as of 06/15/2020   No Known Allergies     Medication List    STOP taking these medications   triamterene-hydrochlorothiazide 37.5-25 MG tablet Commonly known as: MAXZIDE-25     TAKE these medications   albuterol 108 (90 Base) MCG/ACT inhaler Commonly known as: VENTOLIN HFA Inhale 2 puffs into the lungs every 6 (six) hours as needed for wheezing.   amLODipine 10 MG tablet Commonly known as: NORVASC Take 1 tablet (10 mg total) by mouth daily.   hydrALAZINE 25 MG tablet Commonly known as: APRESOLINE Take 1 tablet (25 mg total) by mouth 3 (three) times daily.   labetalol 200 MG tablet Commonly known as: NORMODYNE Take 1 tablet (200 mg total) by mouth 2 (two) times daily.        Brief H and P: For complete details please refer to admission H and P, but in brief *Robin Navarro a 54 y.o.femalewithhistory of hypertension who has not been taking her antihypertensives for more than a year because of insurance issues had recently started following up with a primary care physician 3 weeks ago and was placed on diuretics at labs done was found to have elevated creatinine and was advised to come to the ER. Patient  has/does not have any symptoms denies any chest pain shortness of breath nausea vomiting or diarrhea. Denies taking any NSAIDs. ED Course:In the ER patient's creatinine is around 3.3 and the last one we have in our system was about 6 years ago which was normal. Patient is also mildly anemic. Patient is found to have markedly elevated blood pressure with systolic blood pressure more than 200. Patient admitted for hypertensive urgency and worsening renal function. EKG shows LVH and nonspecific ST changes. Covid test was negative  Hospital Course:   Hypertensive urgency -Patient was placed on amlodipine, triamterene HCTZ outpatient 3 weeks ago.  Presented with hypertensive urgency with SBP in 200s and creatinine 3.3 at the time of admission -Discussed in detail with the patient regarding uncontrolled BP and high risk of chronic medical issues including CKD, CHF etc.  Patient has also history of chronic kidney disease in her family. -BP still uncontrolled, continue Norvasc 10 mg daily, increase labetalol to 200mg  BID, added hydralazine 25 mg p.o. 3 times daily  -Discontinued triamterene HCTZ. -Recommended patient to check her BP daily, follow-up with her PCP, outpatient referral to nephrology     AKI (acute kidney injury) ([place]), likely has underlying CKD unknown stage -Creatinine 3.3 at the time of admission -Renal ultrasound shows medical renal disease, negative for any hydronephrosis or obstruction -UA showed proteinuria, otherwise no UTI, follow SPEP, UPEP, renin, aldosterone, in process -TSH normal -Creatinine 2.6 at the time of discharge, recommended good p.o. diet and hydration    Normocytic anemia -Likely due  to chronic disease, possible underlying CKD, anemia panel shows ferritin 229  Hypokalemia -Potassium 3.0, replaced  Obesity Estimated body mass index is 39.18 kg/m as calculated from the following:   Height as of this encounter: 4\' 11"  (1.499 m).   Weight as of this  encounter: 88 kg.  Counseled on diet and weight control   Day of Discharge S: No acute complaints, BP improving, creatinine improving, looking forward to go home  BP (!) 137/92   Pulse 71   Temp 98 F (36.7 C) (Oral)   Resp 17   Ht 4\' 11"  (1.499 m)   Wt 88 kg   SpO2 100%   BMI 39.18 kg/m   Physical Exam: General: Alert and awake oriented x3 not in any acute distress. HEENT: anicteric sclera, pupils reactive to light and accommodation CVS: S1-S2 clear no murmur rubs or gallops Chest: clear to auscultation bilaterally, no wheezing rales or rhonchi Abdomen: soft nontender, nondistended, normal bowel sounds Extremities: no cyanosis, clubbing or edema noted bilaterally Neuro: Cranial nerves II-XII intact, no focal neurological deficits    Get Medicines reviewed and adjusted: Please take all your medications with you for your next visit with your Primary MD  Please request your Primary MD to go over all hospital tests and procedure/radiological results at the follow up. Please ask your Primary MD to get all Hospital records sent to his/her office.  If you experience worsening of your admission symptoms, develop shortness of breath, life threatening emergency, suicidal or homicidal thoughts you must seek medical attention immediately by calling 911 or calling your MD immediately  if symptoms less severe.  You must read complete instructions/literature along with all the possible adverse reactions/side effects for all the Medicines you take and that have been prescribed to you. Take any new Medicines after you have completely understood and accept all the possible adverse reactions/side effects.   Do not drive when taking pain medications.   Do not take more than prescribed Pain, Sleep and Anxiety Medications  Special Instructions: If you have smoked or chewed Tobacco  in the last 2 yrs please stop smoking, stop any regular Alcohol  and or any Recreational drug use.  Wear Seat  belts while driving.  Please note  You were cared for by a hospitalist during your hospital stay. Once you are discharged, your primary care physician will handle any further medical issues. Please note that NO REFILLS for any discharge medications will be authorized once you are discharged, as it is imperative that you return to your primary care physician (or establish a relationship with a primary care physician if you do not have one) for your aftercare needs so that they can reassess your need for medications and monitor your lab values.   The results of significant diagnostics from this hospitalization (including imaging, microbiology, ancillary and laboratory) are listed below for reference.      Procedures/Studies:  US RENAL  Result Date: 30-Jun-2020 CLINICAL DATA:  Initial evaluation for acute on chronic renal failure. EXAM: RENAL / URINARY TRACT ULTRASOUND COMPLETE COMPARISON:  None. FINDINGS: Right Kidney: Renal measurements: 10.4 x 4.1 x 4.7 cm = volume: 104.7 mL. Diffusely increased echogenicity within the renal parenchyma. No nephrolithiasis or hydronephrosis. No focal renal mass. Left Kidney: Renal measurements: 9.3 x 4.4 x 4.4 cm = volume: 94.5 mL. Diffusely increased echogenicity within the renal parenchyma. No nephrolithiasis or hydronephrosis. No focal renal mass. Bladder: Appears normal for degree of bladder distention. Other: None. IMPRESSION: 1. Diffusely increased echogenicity  within the renal parenchyma, compatible with medical renal disease. 2. No hydronephrosis or other significant finding. Electronically Signed   By: Jeannine Boga M.D.   On: 06/13/2020 06:19       LAB RESULTS: Basic Metabolic Panel: Recent Labs  Lab 06/13/20 0357 06/13/20 0824 06/14/20 0314 06/15/20 0325  NA 141  --  141 139  K 2.7*   < > 3.0* 3.0*  CL 96*  --  102 102  CO2 30  --  28 27  GLUCOSE 150*  --  101* 112*  BUN 53*  --  47* 40*  CREATININE 3.25*  --  2.94* 2.65*  CALCIUM  9.9  --  9.6 9.4  MG 2.0  --   --   --    < > = values in this interval not displayed.   Liver Function Tests: No results for input(s): AST, ALT, ALKPHOS, BILITOT, PROT, ALBUMIN in the last 168 hours. No results for input(s): LIPASE, AMYLASE in the last 168 hours. No results for input(s): AMMONIA in the last 168 hours. CBC: Recent Labs  Lab 06/12/20 2230 06/12/20 2230 06/13/20 0357  WBC 8.9  --  7.8  NEUTROABS 5.9  --   --   HGB 10.9*  --  9.7*  HCT 33.1*  --  30.1*  MCV 92.2   < > 92.6  PLT 215  --  188   < > = values in this interval not displayed.   Cardiac Enzymes: No results for input(s): CKTOTAL, CKMB, CKMBINDEX, TROPONINI in the last 168 hours. BNP: Invalid input(s): POCBNP CBG: No results for input(s): GLUCAP in the last 168 hours.     Disposition and Follow-up: Discharge Instructions    Diet - low sodium heart healthy   Complete by: As directed    Increase activity slowly   Complete by: As directed        DISPOSITION: [place], [place]. Schedule an appointment as soon as possible for a visit in 2 week(s).   Specialty: Family Medicine Why: follow BMET for kidney function  Contact information: [address] [address] [phone]                Time coordinating discharge:  35 minutes  Signed:   Estill Cotta M.D. [facility] 06/15/2020, 12:43 PM

## 2020-06-15 NOTE — Progress Notes (Signed)
Patient refused her [place] heparin this morning. She also stated she does not want other IV line set for her.She is on IV hyralazine 10 mg for high BP. Patient educated on the importance of treatment compliance.

## 2020-06-16 LAB — UPEP/UIFE/LIGHT CHAINS/TP, 24-HR UR
% BETA, Urine: 14.7 %
ALPHA 1 URINE: 3.9 %
Albumin, U: 62.5 %
Alpha 2, Urine: 7 %
Free Kappa Lt Chains,Ur: 110.8 mg/L (ref 0.63–113.79)
Free Kappa/Lambda Ratio: 7.19 (ref 1.03–31.76)
Free Lambda Lt Chains,Ur: 15.4 mg/L — ABNORMAL HIGH (ref 0.47–11.77)
GAMMA GLOBULIN URINE: 11.9 %
Total Protein, Urine-Ur/day: 355 mg/24 hr — ABNORMAL HIGH (ref 30–150)
Total Protein, Urine: 17.7 mg/dL
Total Volume: 2005

## 2020-06-16 LAB — PROTEIN ELECTROPHORESIS, SERUM
A/G Ratio: 1 (ref 0.7–1.7)
Albumin ELP: 3.7 g/dL (ref 2.9–4.4)
Alpha-1-Globulin: 0.3 g/dL (ref 0.0–0.4)
Alpha-2-Globulin: 0.6 g/dL (ref 0.4–1.0)
Beta Globulin: 1.1 g/dL (ref 0.7–1.3)
Gamma Globulin: 1.8 g/dL (ref 0.4–1.8)
Globulin, Total: 3.7 g/dL (ref 2.2–3.9)
Total Protein ELP: 7.4 g/dL (ref 6.0–8.5)

## 2020-06-19 LAB — ALDOSTERONE + RENIN ACTIVITY W/ RATIO
ALDO / PRA Ratio: 3.6 (ref 0.0–30.0)
Aldosterone: 14 ng/dL (ref 0.0–30.0)
PRA LC/MS/MS: 3.869 ng/mL/hr (ref 0.167–5.380)

## 2020-07-19 ENCOUNTER — Inpatient Hospital Stay (HOSPITAL_COMMUNITY)
Admission: EM | Admit: 2020-07-19 | Discharge: 2020-07-22 | DRG: 305 | Disposition: A | Discharge status: Home / Self Care | Payer: Self-pay | Attending: Internal Medicine | Admitting: Internal Medicine

## 2020-07-19 ENCOUNTER — Other Ambulatory Visit: Payer: Self-pay

## 2020-07-19 ENCOUNTER — Encounter (HOSPITAL_COMMUNITY): Payer: Self-pay | Admitting: Emergency Medicine

## 2020-07-19 DIAGNOSIS — Z8249 Family history of ischemic heart disease and other diseases of the circulatory system: Secondary | ICD-10-CM

## 2020-07-19 DIAGNOSIS — Z79899 Other long term (current) drug therapy: Secondary | ICD-10-CM

## 2020-07-19 DIAGNOSIS — N184 Chronic kidney disease, stage 4 (severe): Secondary | ICD-10-CM | POA: Diagnosis present

## 2020-07-19 DIAGNOSIS — I10 Essential (primary) hypertension: Secondary | ICD-10-CM

## 2020-07-19 DIAGNOSIS — Z20822 Contact with and (suspected) exposure to covid-19: Secondary | ICD-10-CM | POA: Diagnosis present

## 2020-07-19 DIAGNOSIS — R778 Other specified abnormalities of plasma proteins: Secondary | ICD-10-CM

## 2020-07-19 DIAGNOSIS — D696 Thrombocytopenia, unspecified: Secondary | ICD-10-CM | POA: Diagnosis present

## 2020-07-19 DIAGNOSIS — I160 Hypertensive urgency: Secondary | ICD-10-CM

## 2020-07-19 DIAGNOSIS — E876 Hypokalemia: Secondary | ICD-10-CM | POA: Diagnosis present

## 2020-07-19 DIAGNOSIS — Z6838 Body mass index (BMI) 38.0-38.9, adult: Secondary | ICD-10-CM

## 2020-07-19 DIAGNOSIS — H539 Unspecified visual disturbance: Secondary | ICD-10-CM | POA: Diagnosis present

## 2020-07-19 DIAGNOSIS — D631 Anemia in chronic kidney disease: Secondary | ICD-10-CM | POA: Diagnosis present

## 2020-07-19 DIAGNOSIS — Z9114 Patient's other noncompliance with medication regimen: Secondary | ICD-10-CM

## 2020-07-19 DIAGNOSIS — R9431 Abnormal electrocardiogram [ECG] [EKG]: Secondary | ICD-10-CM

## 2020-07-19 DIAGNOSIS — N179 Acute kidney failure, unspecified: Secondary | ICD-10-CM

## 2020-07-19 DIAGNOSIS — H4710 Unspecified papilledema: Secondary | ICD-10-CM | POA: Diagnosis present

## 2020-07-19 DIAGNOSIS — D649 Anemia, unspecified: Secondary | ICD-10-CM | POA: Diagnosis present

## 2020-07-19 DIAGNOSIS — E669 Obesity, unspecified: Secondary | ICD-10-CM | POA: Diagnosis present

## 2020-07-19 DIAGNOSIS — I129 Hypertensive chronic kidney disease with stage 1 through stage 4 chronic kidney disease, or unspecified chronic kidney disease: Secondary | ICD-10-CM | POA: Diagnosis present

## 2020-07-19 DIAGNOSIS — R809 Proteinuria, unspecified: Secondary | ICD-10-CM | POA: Diagnosis present

## 2020-07-19 DIAGNOSIS — Z91148 Patient's other noncompliance with medication regimen for other reason: Secondary | ICD-10-CM

## 2020-07-19 DIAGNOSIS — I161 Hypertensive emergency: Principal | ICD-10-CM | POA: Insufficient documentation

## 2020-07-19 DIAGNOSIS — F17290 Nicotine dependence, other tobacco product, uncomplicated: Secondary | ICD-10-CM | POA: Diagnosis present

## 2020-07-19 HISTORY — DX: Chronic kidney disease, unspecified: N18.9

## 2020-07-19 HISTORY — DX: Anemia, unspecified: D64.9

## 2020-07-19 LAB — CBC
HCT: 33.5 % — ABNORMAL LOW (ref 36.0–46.0)
HCT: 32 % — ABNORMAL LOW (ref 36.0–46.0)
Hemoglobin: 10.6 g/dL — ABNORMAL LOW (ref 12.0–15.0)
Hemoglobin: 10.3 g/dL — ABNORMAL LOW (ref 12.0–15.0)
MCH: 28.1 pg (ref 26.0–34.0)
MCH: 28.5 pg (ref 26.0–34.0)
MCHC: 31.6 g/dL (ref 30.0–36.0)
MCHC: 32.2 g/dL (ref 30.0–36.0)
MCV: 88.9 fL (ref 80.0–100.0)
MCV: 88.6 fL (ref 80.0–100.0)
Platelets: 88 10*3/uL — ABNORMAL LOW (ref 150–400)
Platelets: 88 10*3/uL — ABNORMAL LOW (ref 150–400)
RBC: 3.77 MIL/uL — ABNORMAL LOW (ref 3.87–5.11)
RBC: 3.61 MIL/uL — ABNORMAL LOW (ref 3.87–5.11)
RDW: 16.2 % — ABNORMAL HIGH (ref 11.5–15.5)
RDW: 16.4 % — ABNORMAL HIGH (ref 11.5–15.5)
WBC: 9.1 10*3/uL (ref 4.0–10.5)
WBC: 10.6 10*3/uL — ABNORMAL HIGH (ref 4.0–10.5)
nRBC: 0 % (ref 0.0–0.2)
nRBC: 0 % (ref 0.0–0.2)

## 2020-07-19 LAB — BASIC METABOLIC PANEL
Anion gap: 13 (ref 5–15)
BUN: 40 mg/dL — ABNORMAL HIGH (ref 6–20)
CO2: 24 mmol/L (ref 22–32)
Calcium: 9.8 mg/dL (ref 8.9–10.3)
Chloride: 101 mmol/L (ref 98–111)
Creatinine, Ser: 3.77 mg/dL — ABNORMAL HIGH (ref 0.44–1.00)
GFR, Estimated: 13 mL/min — ABNORMAL LOW (ref >60)
Glucose, Bld: 97 mg/dL (ref 70–99)
Potassium: 3.2 mmol/L — ABNORMAL LOW (ref 3.5–5.1)
Sodium: 138 mmol/L (ref 135–145)

## 2020-07-19 LAB — CREATININE, SERUM
Creatinine, Ser: 3.98 mg/dL — ABNORMAL HIGH (ref 0.44–1.00)
GFR, Estimated: 12 mL/min — ABNORMAL LOW (ref >60)

## 2020-07-19 LAB — RESPIRATORY PANEL BY RT PCR (FLU A&B, COVID)
Influenza A by PCR: NEGATIVE
Influenza B by PCR: NEGATIVE
SARS Coronavirus 2 by RT PCR: NEGATIVE

## 2020-07-19 LAB — I-STAT BETA HCG BLOOD, ED (MC, WL, AP ONLY): I-stat hCG, quantitative: 7.3 m[IU]/mL — ABNORMAL HIGH (ref <5)

## 2020-07-19 LAB — TROPONIN I (HIGH SENSITIVITY)
Troponin I (High Sensitivity): 26 ng/L — ABNORMAL HIGH (ref <18)
Troponin I (High Sensitivity): 26 ng/L — ABNORMAL HIGH (ref <18)
Troponin I (High Sensitivity): 27 ng/L — ABNORMAL HIGH (ref <18)

## 2020-07-19 LAB — MAGNESIUM: Magnesium: 2.2 mg/dL (ref 1.7–2.4)

## 2020-07-19 MED ORDER — ONDANSETRON HCL 4 MG/2ML IJ SOLN
4.0000 mg | Freq: Four times a day (QID) | INTRAMUSCULAR | Status: DC | PRN
  Administered 2020-07-20: 4 mg via INTRAVENOUS
  Filled 2020-07-19: qty 2

## 2020-07-19 MED ORDER — POTASSIUM CHLORIDE CRYS ER 20 MEQ PO TBCR
40.0000 meq | EXTENDED_RELEASE_TABLET | Freq: Two times a day (BID) | ORAL | Status: DC
  Administered 2020-07-19 – 2020-07-21 (×4): 40 meq via ORAL
  Filled 2020-07-19 (×4): qty 2

## 2020-07-19 MED ORDER — LABETALOL HCL 5 MG/ML IV SOLN
20.0000 mg | Freq: Once | INTRAVENOUS | Status: AC
  Administered 2020-07-19: 20 mg via INTRAVENOUS
  Filled 2020-07-19: qty 4

## 2020-07-19 MED ORDER — HEPARIN SODIUM (PORCINE) 5000 UNIT/ML IJ SOLN
5000.0000 [IU] | Freq: Three times a day (TID) | INTRAMUSCULAR | Status: DC
  Administered 2020-07-19 – 2020-07-20 (×2): 5000 [IU] via SUBCUTANEOUS
  Filled 2020-07-19 (×2): qty 1

## 2020-07-19 MED ORDER — HYDRALAZINE HCL 20 MG/ML IJ SOLN
10.0000 mg | Freq: Once | INTRAMUSCULAR | Status: AC
  Administered 2020-07-19: 10 mg via INTRAVENOUS
  Filled 2020-07-19: qty 1

## 2020-07-19 MED ORDER — AMLODIPINE BESYLATE 5 MG PO TABS
10.0000 mg | ORAL_TABLET | Freq: Once | ORAL | Status: AC
  Administered 2020-07-19: 10 mg via ORAL
  Filled 2020-07-19: qty 2

## 2020-07-19 MED ORDER — CARVEDILOL 12.5 MG PO TABS
12.5000 mg | ORAL_TABLET | Freq: Two times a day (BID) | ORAL | Status: DC
  Administered 2020-07-20 – 2020-07-22 (×5): 12.5 mg via ORAL
  Filled 2020-07-19 (×4): qty 1
  Filled 2020-07-19: qty 4

## 2020-07-19 MED ORDER — ACETAMINOPHEN 650 MG RE SUPP: 650.0000 mg | Freq: Four times a day (QID) | RECTAL | Status: DC | PRN

## 2020-07-19 MED ORDER — ACETAMINOPHEN 325 MG PO TABS: 650.0000 mg | ORAL_TABLET | Freq: Four times a day (QID) | ORAL | Status: DC | PRN

## 2020-07-19 MED ORDER — SENNOSIDES-DOCUSATE SODIUM 8.6-50 MG PO TABS: 1.0000 | ORAL_TABLET | Freq: Every evening | ORAL | Status: DC | PRN

## 2020-07-19 MED ORDER — HYDRALAZINE HCL 25 MG PO TABS
25.0000 mg | ORAL_TABLET | Freq: Three times a day (TID) | ORAL | Status: DC
  Administered 2020-07-19 – 2020-07-20 (×4): 25 mg via ORAL
  Filled 2020-07-19 (×4): qty 1

## 2020-07-19 MED ORDER — LACTATED RINGERS IV SOLN: INTRAVENOUS | Status: DC

## 2020-07-19 MED ORDER — LABETALOL HCL 200 MG PO TABS
300.0000 mg | ORAL_TABLET | Freq: Once | ORAL | Status: AC
  Administered 2020-07-19: 300 mg via ORAL
  Filled 2020-07-19: qty 2

## 2020-07-19 MED ORDER — AMLODIPINE BESYLATE 10 MG PO TABS
10.0000 mg | ORAL_TABLET | Freq: Every day | ORAL | Status: DC
  Administered 2020-07-20 – 2020-07-22 (×3): 10 mg via ORAL
  Filled 2020-07-19 (×2): qty 1
  Filled 2020-07-19: qty 2

## 2020-07-19 NOTE — ED Notes (Signed)
Patient requested to discontinue the IV fluid .

## 2020-07-19 NOTE — ED Provider Notes (Signed)
[place] EMERGENCY DEPARTMENT Provider Note   CSN: [id number] Arrival date & time: 07/19/20  1725     History Chief Complaint  Patient presents with  . Hypertension    Robin Navarro is a 54 y.o. female.  HPI 54 year old female history of hypertension presents today from optometry office with report of papilledema and hypertensive urgency.  Patient reports that she has known hypertension and has been taking her medication as prescribed.  She was going for scheduled visit today.  She reports that she is prescribed only hydralazine 3 times a day.  Review of medical record reveals also labetalol and Norvasc.   Review of medical medical record reveals some recent admission for hypertensive urgency September 10 through September 13 of this year.  Patient was discharged on 3 medications with plans to advance if her blood pressure remained elevated.  She was to discontinue her triamterene hydrochlorothiazide.  She also had a elevated creatinine at 3.3    Past Medical History:  Diagnosis Date  . Hypertension     Patient Active Problem List   Diagnosis Date Noted  . AKI (acute kidney injury) ([place]) 06/13/2020  . Normocytic anemia 06/13/2020  . Acute kidney injury ([place]) 06/13/2020  . Hypertensive urgency 06/12/2020  . Acute bronchitis 02/11/2013  . Community acquired pneumonia 02/10/2013  . HTN (hypertension) 02/10/2013    History reviewed. No pertinent surgical history.   OB History    Gravida  1   Para  1   Term      Preterm      AB      Living        SAB      TAB      Ectopic      Multiple      Live Births              Family History  Problem Relation Age of Onset  . Diabetes Mother   . Hypertension Mother     Social History   Tobacco Use  . Smoking status: Current Every Day Smoker    Packs/day: 0.01    Types: Cigars  . Smokeless tobacco: Never Used  Substance Use Topics  . Alcohol use: Yes    Comment: 1 every six  months  . Drug use: Yes    Types: Marijuana    Home Medications Prior to Admission medications   Medication Sig Start Date End Date Taking? Authorizing Provider  albuterol (PROVENTIL HFA;VENTOLIN HFA) 108 (90 BASE) MCG/ACT inhaler Inhale 2 puffs into the lungs every 6 (six) hours as needed for wheezing. Patient not taking: Reported on 02/07/2017 02/12/13   Rai, Vernelle Emerald, MD  amLODipine (NORVASC) 10 MG tablet Take 1 tablet (10 mg total) by mouth daily. 06/15/20   Rai, Vernelle Emerald, MD  hydrALAZINE (APRESOLINE) 25 MG tablet Take 1 tablet (25 mg total) by mouth 3 (three) times daily. 06/15/20   Rai, Ripudeep K, MD  labetalol (NORMODYNE) 200 MG tablet Take 1 tablet (200 mg total) by mouth 2 (two) times daily. 06/15/20   Mendel Corning, MD    Allergies    Patient has no known allergies.  Review of Systems   Review of Systems  Physical Exam Updated Vital Signs BP (!) 247/170 (BP Location: Right Arm)   Pulse 100   Temp 98.6 F (37 C) (Oral)   Resp 16   LMP 12/23/2013   SpO2 100%   Physical Exam Vitals and nursing note reviewed.  Constitutional:  Appearance: She is well-developed.  HENT:     Head: Normocephalic and atraumatic.     Right Ear: External ear normal.     Left Ear: External ear normal.     Nose: Nose normal.  Eyes:     Conjunctiva/sclera: Conjunctivae normal.     Pupils: Pupils are equal, round, and reactive to light.  Cardiovascular:     Rate and Rhythm: Normal rate and regular rhythm.     Heart sounds: Normal heart sounds.  Pulmonary:     Effort: Pulmonary effort is normal.     Breath sounds: Normal breath sounds.  Abdominal:     General: Bowel sounds are normal.     Palpations: Abdomen is soft.  Musculoskeletal:        General: Normal range of motion.     Cervical back: Normal range of motion and neck supple.  Skin:    General: Skin is warm and dry.  Neurological:     Mental Status: She is alert and oriented to person, place, and time.     Deep Tendon  Reflexes: Reflexes are normal and symmetric.  Psychiatric:        Behavior: Behavior normal.        Thought Content: Thought content normal.        Judgment: Judgment normal.     ED Results / Procedures / Treatments   Labs (all labs ordered are listed, but only abnormal results are displayed) Labs Reviewed  BASIC METABOLIC PANEL  CBC  I-STAT BETA HCG BLOOD, ED ([facility], [facility], [facility] ONLY)  TROPONIN I (HIGH SENSITIVITY)    EKG EKG Interpretation  Date/Time:  "Sunday July 19 2020 17:34:24 EDT Ventricular Rate:  97 PR Interval:  152 QRS Duration: 98 QT Interval:  364 QTC Calculation: 462 R Axis:   89 Text Interpretation: Normal sinus rhythm Biatrial enlargement Minimal voltage criteria for LVH, may be normal variant ( Sokolow-Lyon ) ST & T wave abnormality, consider inferior ischemia ST & T wave abnormality, consider anterolateral ischemia Prolonged QT Abnormal ECG No significant change since last tracing Confirmed by Blaise Grieshaber ([id number]) on 07/19/2020 6:38:26 PM   Radiology No results found.  Procedures .Critical Care Performed by: Kishaun Erekson, MD Authorized by: Alando Colleran, MD   Critical care provider statement:    Critical care time (minutes):  45   Critical care end time:  07/19/2020 8:07 PM   Critical care was time spent personally by me on the following activities:  Discussions with consultants, evaluation of patient's response to treatment, examination of patient, ordering and performing treatments and interventions, ordering and review of laboratory studies, ordering and review of radiographic studies, pulse oximetry, re-evaluation of patient's condition, obtaining history from patient or surrogate and review of old charts   (including critical care time)  Medications Ordered in ED Medications - No data to display  ED Course  I have reviewed the triage vital signs and the nursing notes.  Pertinent labs & imaging results that were available during my care of the  patient were reviewed by me and considered in my medical decision making (see chart for details).    MDM Rules/Calculators/A&P                          54"  year old female with prior history of hypertensive emergency presents today with blood pressure elevated 220/148 and papilledema per optometrist.  Patient denies any significant symptoms.  Review of records reveal that she was recently admitted  and discharged with hypertensive emergency.  She was discharged on Norvasc, labetalol, and hydralazine.  However on discussions the patient appears that she has been taking only the hydralazine. Patient had elevated creatinine during last admission.  However it had decreased to 2.65 prior to discharge.  Today her creatinine is again elevated at 3.77.  Patient has received oral medications here and her blood pressure is gradually decreasing now down to 558 systolically.  Given that patient has had no recent similar symptoms, will attempt oral medications.  However, due to her increased creatinine plan admission for further treatment and evaluation. Discussed with Dr. Tonie Griffith and will see for admission  Final Clinical Impression(s) / ED Diagnoses Final diagnoses:  Hypertensive urgency  AKI (acute kidney injury) ([place])  Noncompliance with medication regimen    Rx / DC Orders ED Discharge Orders    None       Pattricia Boss, MD 07/19/20 2008

## 2020-07-19 NOTE — ED Notes (Signed)
Pt stated she is feeling worse than when she came in and requested IV be removed

## 2020-07-19 NOTE — H&P (Signed)
History and Physical    CASIMIRA SUTPHIN OKH:[id number] DOB: 07/17/1966 DOA: 07/19/2020  PCP: [name], MD   Patient coming from:   Home  Chief Complaint:  Elevated blood pressure  HPI: Robin Navarro is a 54 y.o. female with medical history significant for hypertension and AKI. Was admitted last month for hypertensive emergency. She presents today from optometry office with report of papilledema and hypertensive urgency. She reports having a gray ring around things in her left visual field and has intermittent pain in left eye for past few months. She went to optometry today and was found to have elevated BP of 240/110 with papilledema Patient reports that she has known hypertension but reports she has missed some doses of her norvasc and hydralazine. She states she stopped taking labetolol as it made her feel bad.  When she was admitted in September of this year she had AKI which improved with blood pressure control. She denies any chest pain or pressure.  She denies syncope, numbness or weakness of extremities, slurred speech or drooping face. She smokes 1 to 2 cigars a day.  She drinks alcohol socially and she occasionally smokes marijuana.  She denies any cocaine, heroin or other illicit drug use  ED Course: In the emergency room she was found to have a significant elevated blood pressure and was given IV and oral antihypertensives but blood pressure remained significantly elevated at 190-220/90-105. She is found to have elevated BUN and creatinine levels.   Review of Systems:  General: Denies weakness, fever, chills, weight loss, night sweats.  Denies dizziness.  Denies change in appetite HENT: Denies head trauma, headache, denies change in hearing, tinnitus.  Denies nasal congestion or bleeding.  Denies sore throat, sores in mouth.   Eyes: Reports visual change of gray ring around objects in left eye with pain in left eye. Denies blurry vision, drainage.  Denies discoloration of  eyes. Neck: Denies pain.  Denies swelling.  Denies pain with movement. Cardiovascular: Denies chest pain, palpitations.  Denies edema.  Denies orthopnea Respiratory: Denies shortness of breath, cough.  Denies wheezing.  Denies sputum production Gastrointestinal: Denies abdominal pain, swelling.  Denies nausea, vomiting, diarrhea.  Denies melena.  Denies hematemesis. Musculoskeletal: Denies limitation of movement.  Denies deformity or swelling.  Denies pain.  Denies arthralgias or myalgias. Genitourinary: Denies pelvic pain.  Denies urinary frequency or hesitancy.  Denies dysuria.  Skin: Denies rash.  Denies petechiae, purpura, ecchymosis. Neurological: Denies headache.  Denies syncope.  Denies seizure activity.  Denies weakness or paresthesia.  Denies slurred speech, drooping face.  Denies visual change. Psychiatric: Denies depression, anxiety.  Denies suicidal thoughts or ideation.  Denies hallucinations.  Past Medical History:  Diagnosis Date  . Anemia   . Chronic kidney disease   . Hypertension     Past Surgical History:  Procedure Laterality Date  . NO PAST SURGERIES      Social History  reports that she has been smoking cigars. She has been smoking about 0.01 packs per day. She has never used smokeless tobacco. She reports current alcohol use. She reports current drug use. Drug: Marijuana.  No Known Allergies  Family History  Problem Relation Age of Onset  . Diabetes Mother   . Hypertension Mother      Prior to Admission medications   Medication Sig Start Date End Date Taking? Authorizing Provider  acetaminophen (TYLENOL) 500 MG tablet Take 500 mg by mouth as needed for mild pain or moderate pain.   Yes  [provider]  hydrALAZINE (APRESOLINE) 25 MG tablet Take 1 tablet (25 mg total) by mouth 3 (three) times daily. 06/15/20  Yes Rai, Ripudeep K, MD  labetalol (NORMODYNE) 200 MG tablet Take 1 tablet (200 mg total) by mouth 2 (two) times daily. 06/15/20  Yes Rai,  Ripudeep K, MD  albuterol (PROVENTIL HFA;VENTOLIN HFA) 108 (90 BASE) MCG/ACT inhaler Inhale 2 puffs into the lungs every 6 (six) hours as needed for wheezing. Patient not taking: Reported on 02/07/2017 02/12/13   Rai, Vernelle Emerald, MD  amLODipine (NORVASC) 10 MG tablet Take 1 tablet (10 mg total) by mouth daily. Patient not taking: Reported on 07/19/2020 06/15/20   Mendel Corning, MD    Physical Exam: Vitals:   07/19/20 1900 07/19/20 1915 07/19/20 1930 07/19/20 1945  BP:  (!) 197/124 (!) 207/136 (!) 198/127  Pulse: 87 81 80 79  Resp: (!) 22 15 16  (!) 22  Temp:      TempSrc:      SpO2: 100% 100% 100% 100%  Weight:      Height:        Constitutional: NAD, calm, comfortable Vitals:   07/19/20 1900 07/19/20 1915 07/19/20 1930 07/19/20 1945  BP:  (!) 197/124 (!) 207/136 (!) 198/127  Pulse: 87 81 80 79  Resp: (!) 22 15 16  (!) 22  Temp:      TempSrc:      SpO2: 100% 100% 100% 100%  Weight:      Height:       General: WDWN, Alert and oriented x3.  Eyes: EOMI, PERRL, lids and conjunctivae normal.  Sclera nonicteric HENT:  [place]/AT, external ears normal.  Nares patent without epistasis.  Mucous membranes are moist. Posterior pharynx clear of any exudate or lesions.  Neck: Soft, normal range of motion, supple, no masses, no thyromegaly.  Trachea midline Respiratory: clear to auscultation bilaterally, no wheezing, no crackles. Normal respiratory effort. No accessory muscle use.  Cardiovascular: Regular rate and rhythm, no murmurs / rubs / gallops. No extremity edema. 2+ pedal pulses.  Abdomen: Soft, no tenderness, nondistended, no rebound or guarding. Obese.  No masses palpated. Bowel sounds normoactive Musculoskeletal: FROM. no clubbing / cyanosis. No joint deformity upper and lower extremities. no contractures. Normal muscle tone.  Skin: Warm, dry, intact no rashes, lesions, ulcers. No induration Neurologic: CN 2-12 grossly intact.  Normal speech. Sensation intact, patella DTR +1 bilaterally.  Strength 5/5 in all extremities.   Psychiatric: Normal judgment and insight.  Normal mood.    Labs on Admission: I have personally reviewed following labs and imaging studies  CBC: Recent Labs  Lab 07/19/20 1750  WBC 9.1  HGB 10.6*  HCT 33.5*  MCV 88.9  PLT 88*    Basic Metabolic Panel: Recent Labs  Lab 07/19/20 1750  NA 138  K 3.2*  CL 101  CO2 24  GLUCOSE 97  BUN 40*  CREATININE 3.77*  CALCIUM 9.8    GFR: Estimated Creatinine Clearance: 16.8 mL/min (A) (by C-G formula based on SCr of 3.77 mg/dL (H)).  Liver Function Tests: No results for input(s): AST, ALT, ALKPHOS, BILITOT, PROT, ALBUMIN in the last 168 hours.  Urine analysis:    Component Value Date/Time   COLORURINE YELLOW 06/12/2020 2223   APPEARANCEUR HAZY (A) 06/12/2020 2223   LABSPEC 1.010 06/12/2020 2223   PHURINE 6.0 06/12/2020 2223   GLUCOSEU NEGATIVE 06/12/2020 2223   HGBUR MODERATE (A) 06/12/2020 [place] NEGATIVE 06/12/2020 [place] 06/12/2020 2223  PROTEINUR 100 (A) 06/12/2020 2223   UROBILINOGEN 0.2 01/06/2014 1848   NITRITE NEGATIVE 06/12/2020 2223   LEUKOCYTESUR MODERATE (A) 06/12/2020 2223    Radiological Exams on Admission: No results found.  EKG: Independently reviewed.  EKG is reviewed and shows normal sinus rhythm with nonspecific ST changes in the inferior and anterior lateral leads.  Possibly secondary to ischemia.  LVH by voltage criteria.  QTc 462  Assessment/Plan Principal Problem:   Hypertensive emergency Ms. Schupp will be admitted to cardiac telemetry unit.  She has been given IV labetalol, p.o. labetalol and p.o. Norvasc in the emergency room.  Blood pressure remains in the 190/100.  Given dose of 10 mg IV hydralazine.  Will monitor blood pressure closely. Patient reports that she does not feel well when she took p.o. labetalol at home.  Will change to Coreg twice daily starting the morning as she already had a dose of labetalol orally  tonight.  Continue Norvasc and hydralazine p.o. Patient reportedly had papilledema from optometry visit earlier today.   She has EKG changes with LVH by voltage criteria and nonspecific inferior and anteriorlateral ST changes.  Monitor on telemetry Check serial troponin levels.  Active Problems:   AKI (acute kidney injury) ([place]) IV fluid hydration with LR at 100 mils per hour overnight.  Blood pressure control Recheck electrolytes and renal function morning    Hypokalemia Check magnesium level.  Potassium.  If magnesium is low we will supplement.    Elevated troponin Monitor serial troponin levels.    Abnormal EKG Abnormal EKG as described above.  Monitor on telemetry    Normocytic anemia Stable    Visual disturbance Patient with visual changes in her left eye for the last 2 to 3 months.  Reportedly she had papilledema on optometry evaluation earlier today.  Most likely secondary to significant hypertension.  Continue to monitor with improvement in blood pressure     DVT prophylaxis: Heparin for DVT prophylaxis Code Status:   Full code Family Communication:  Diagnosis and plan discussed with patient.  Importance of compliance with antihypertensive regimen discussed.  Questions answered.  Patient agrees with plan.  Further recommendations to follow as clinically indicated Disposition Plan:   Patient is from:  Home  Anticipated DC to:  Home  Anticipated DC date:  Anticipate greater than 2 midnight stay in hospital to treat acute medical condition  Anticipated DC barriers: No barriers to discharge identified at this time   Admission status:  Inpatient   Yevonne Aline Girolamo Lortie MD [facility]  How to contact the [facility] Attending or Consulting provider [place] or covering provider during after hours [place], for this patient?   1. Check the care team in [facility] and look for a) attending/consulting [facility] provider listed and b) the [facility] team listed 2. Log into [email or web address] and use [facility]'s universal password to access. If you do not have the password, please contact the hospital operator. 3. Locate the [facility] provider you are looking for under [facility] and page to a number that you can be directly reached. 4. If you still have difficulty reaching the provider, please page the [facility] (Director on Call) for the Hospitalists listed on amion for assistance.  07/19/2020, 8:41 PM

## 2020-07-19 NOTE — ED Triage Notes (Signed)
Pt reports "seeing gray" when she closes L eye x 2-3 months.  Went to have eye exam today and was told to come to ED due to hypertension  Pt states she has been taking her BP medication.  Denies pain.  Denies weakness/numbness.

## 2020-07-20 ENCOUNTER — Inpatient Hospital Stay (HOSPITAL_COMMUNITY): Payer: Self-pay

## 2020-07-20 DIAGNOSIS — N179 Acute kidney failure, unspecified: Secondary | ICD-10-CM

## 2020-07-20 DIAGNOSIS — D649 Anemia, unspecified: Secondary | ICD-10-CM

## 2020-07-20 DIAGNOSIS — D631 Anemia in chronic kidney disease: Secondary | ICD-10-CM

## 2020-07-20 DIAGNOSIS — R9431 Abnormal electrocardiogram [ECG] [EKG]: Secondary | ICD-10-CM

## 2020-07-20 DIAGNOSIS — H539 Unspecified visual disturbance: Secondary | ICD-10-CM

## 2020-07-20 DIAGNOSIS — E876 Hypokalemia: Secondary | ICD-10-CM

## 2020-07-20 DIAGNOSIS — N189 Chronic kidney disease, unspecified: Secondary | ICD-10-CM

## 2020-07-20 DIAGNOSIS — R778 Other specified abnormalities of plasma proteins: Secondary | ICD-10-CM

## 2020-07-20 DIAGNOSIS — H4710 Unspecified papilledema: Secondary | ICD-10-CM

## 2020-07-20 DIAGNOSIS — I161 Hypertensive emergency: Principal | ICD-10-CM

## 2020-07-20 LAB — URINALYSIS, ROUTINE W REFLEX MICROSCOPIC
Bilirubin Urine: NEGATIVE
Glucose, UA: NEGATIVE mg/dL
Hgb urine dipstick: NEGATIVE
Ketones, ur: NEGATIVE mg/dL
Leukocytes,Ua: NEGATIVE
Nitrite: NEGATIVE
Protein, ur: 100 mg/dL — AB
Specific Gravity, Urine: 1.011 (ref 1.005–1.030)
pH: 5 (ref 5.0–8.0)

## 2020-07-20 LAB — BASIC METABOLIC PANEL
Anion gap: 12 (ref 5–15)
BUN: 47 mg/dL — ABNORMAL HIGH (ref 6–20)
CO2: 23 mmol/L (ref 22–32)
Calcium: 9.4 mg/dL (ref 8.9–10.3)
Chloride: 101 mmol/L (ref 98–111)
Creatinine, Ser: 3.98 mg/dL — ABNORMAL HIGH (ref 0.44–1.00)
GFR, Estimated: 12 mL/min — ABNORMAL LOW (ref >60)
Glucose, Bld: 153 mg/dL — ABNORMAL HIGH (ref 70–99)
Potassium: 3.5 mmol/L (ref 3.5–5.1)
Sodium: 136 mmol/L (ref 135–145)

## 2020-07-20 LAB — CBC
HCT: 30.1 % — ABNORMAL LOW (ref 36.0–46.0)
Hemoglobin: 9.5 g/dL — ABNORMAL LOW (ref 12.0–15.0)
MCH: 28.4 pg (ref 26.0–34.0)
MCHC: 31.6 g/dL (ref 30.0–36.0)
MCV: 89.9 fL (ref 80.0–100.0)
Platelets: 84 10*3/uL — ABNORMAL LOW (ref 150–400)
RBC: 3.35 MIL/uL — ABNORMAL LOW (ref 3.87–5.11)
RDW: 16.7 % — ABNORMAL HIGH (ref 11.5–15.5)
WBC: 9.4 10*3/uL (ref 4.0–10.5)
nRBC: 0 % (ref 0.0–0.2)

## 2020-07-20 LAB — TROPONIN I (HIGH SENSITIVITY): Troponin I (High Sensitivity): 28 ng/L — ABNORMAL HIGH (ref <18)

## 2020-07-20 LAB — HEPATITIS B SURFACE ANTIGEN: Hepatitis B Surface Ag: NONREACTIVE

## 2020-07-20 LAB — HEPATITIS C ANTIBODY: HCV Ab: NONREACTIVE

## 2020-07-20 LAB — HEPATITIS B CORE ANTIBODY, TOTAL: Hep B Core Total Ab: NONREACTIVE

## 2020-07-20 LAB — LACTATE DEHYDROGENASE: LDH: 530 U/L — ABNORMAL HIGH (ref 98–192)

## 2020-07-20 LAB — ECHOCARDIOGRAM COMPLETE
Height: 59 in
S' Lateral: 3.6 cm
Weight: 3072 oz

## 2020-07-20 LAB — RAPID HIV SCREEN (HIV 1/2 AB+AG)
HIV 1/2 Antibodies: NONREACTIVE
HIV-1 P24 Antigen - HIV24: NONREACTIVE

## 2020-07-20 LAB — PROTEIN / CREATININE RATIO, URINE
Creatinine, Urine: 110.38 mg/dL
Protein Creatinine Ratio: 0.97 mg/mg{Cre} — ABNORMAL HIGH (ref 0.00–0.15)
Total Protein, Urine: 107 mg/dL

## 2020-07-20 MED ORDER — LABETALOL HCL 5 MG/ML IV SOLN
10.0000 mg | INTRAVENOUS | Status: DC | PRN
  Administered 2020-07-21: 10 mg via INTRAVENOUS
  Filled 2020-07-20: qty 4

## 2020-07-20 NOTE — ED Notes (Signed)
Ordered breakfast 

## 2020-07-20 NOTE — ED Notes (Signed)
Lunch Tray Ordered @ 1050. 

## 2020-07-20 NOTE — Progress Notes (Signed)
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  Patient presented to optometry for ongoing left eye vision issue/central scotoma for months.  Patient stopped taking her labetalol as it made her "jittery".  Reports taking amlodipine and hydralazine for most part.  "Trying" to watch his salt intake.  She reports snoring but denies daytime sleepiness or fatigue.  She had normal TSH and aldosterone/renin activity last months.  UPEP without M spike.  No significant proteinuria.  He had renal ultrasound was consistent with CKD.  Never seen nephrology or cardiology.  In ED, BP as high as 247/170. Cr 3.77 (2.65 on 9/13).  BUN 40.  K3.2. HS trop 26> 26.  Hgb 10.6 (baseline).  Platelet 88 (188 on 9/10).  Quantitative hCG 7.3.  Influenza and COVID-19 PCR negative.  Echocardiogram ordered.  Started on IV and p.o. oral antihypertensive medications, and admitted for hypertensive emergency and AKI on CKD-4.  The next day, blood pressure improved.  Renal function slightly worse.   Subjective: Seen and examined earlier this morning.  No major events overnight of this morning.  No complaints.  Still with central vision loss in her left eye.  Reports mild headache.  No nausea or vomiting.  Denies focal numbness or tingling.  Denies chest pain or dyspnea.  Objective: Vitals:   07/20/20 0600 07/20/20 0615 07/20/20 0630 07/20/20 0645  BP: (!) 153/106  (!) 153/101   Pulse: 89 90 88 89  Resp: 18 18 18 19   Temp:      TempSrc:      SpO2: 97% 98% 96% 97%  Weight:        Height:       No intake or output data in the 24 hours ending 07/20/20 0946 Filed Weights   07/19/20 1845  Weight: 87.1 kg    Examination:  GENERAL: No apparent distress.  Nontoxic. HEENT: MMM.  Diminished central vision from left eye. NECK: Supple.  No apparent JVD.  RESP: On room air.  No IWOB.  Fair aeration bilaterally. CVS:  RRR. Heart sounds normal.  2+ radial and DP pulses bilaterally ABD/GI/GU: BS+. Abd soft, NTND.  MSK/EXT:  Moves extremities. No apparent deformity. No edema.  SKIN: no apparent skin lesion or wound NEURO: Awake, alert and oriented appropriately.  No apparent focal neuro deficit other than diminished central vision from left eye. PSYCH: Calm. Normal affect.  Procedures:  None  Microbiology summarized: Influenza and COVID-19 PCR negative.  Assessment & Plan: Hypertensive emergency: BP 247/170 on admit.  Likely due to noncompliance with medication and dietary indiscretion.  Need to exclude secondary causes.  Work-up in 06/2020 with normal TSH, aldosterone/renin, no M spike on UPEP and no significant proteinuria.  Renal US consistent with CKD.  Denies NSAID use.  Blood pressure improved this morning.  -Discontinue IV fluid -Renal duplex -May need outpatient sleep study-stop bang score 3-high risk for OSA -Continue amlodipine 10 mg daily, Coreg 12.5 mg twice daily, hydralazine 12.5 mg 3 times daily. -Add as needed IV labetalol -Follow echocardiogram -Consult nephrology -May need ambulatory referral to Dr. Blenda [facility] clinic (hypertension clinic).  AKI  on CKD-4 with mild azotemia: Cr 3.77 this admission and trended up to 3.98.  Admitted with AKI (Cr of 3.37, unknown baaseline) last month, and Cr was down to 2.65 on discharge.  She denies NSAID use.  She was on lisinopril/HCTZ prior to previous admission that was discontinued on discharge.  She had some work-up last month as above.  -Discontinue IV fluid-I do not think her AKI is prerenal. -Nephrology  consult -Renal duplex  Hypokalemia: She was taken off lisinopril/HCTZ last month. Not sure if she is still taking it.  Her aldosterone/renin ratio was within normal last month.  Hypokalemia improved. -Check renal duplex -Check urine chemistry  Mildly elevated troponin: Likely demand ischemia from uncontrolled hypertension. -Follow echocardiogram.  Doubt need for further work-up.  Anemia of renal disease: Recent baseline Hgb 9-10> 10.6> 9.5.  Normal anemia panel last month. -Monitor intermittently  Thrombocytopenia: Platelet 88>> 84 (188 last month).  No signs of bleeding. -Check HIT antibody -Discontinue subcu heparin -SCD for VT prophylaxis  Papilledema/visual disturbance/scotoma in left eye: Not acute.  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   Body mass index is 38.78 kg/m.         DVT prophylaxis:  heparin injection 5,000 Units Start: 07/19/20 2200  Code Status: Full code Family Communication: Patient and/or RN. Available if any question.  Status is: Inpatient  Remains inpatient appropriate because:Hemodynamically unstable, Ongoing diagnostic testing needed not appropriate for outpatient work up and Inpatient level of care appropriate due to severity of illness   Dispo: The patient is from: Home              Anticipated d/c is to: Home              Anticipated d/c date is: 2 days              Patient currently is not medically stable to d/c.       Consultants:  Nephrology   Sch Meds:  Scheduled Meds: . amLODipine  10 mg Oral Daily  . carvedilol  12.5 mg Oral BID WC  . heparin  5,000 Units Subcutaneous Q8H  . hydrALAZINE  25 mg Oral TID  . potassium chloride  40 mEq Oral BID   Continuous Infusions: PRN Meds:.acetaminophen **OR** acetaminophen, ondansetron (ZOFRAN) IV, senna-docusate  Antimicrobials: Anti-infectives (From admission, onward)   None       I have personally  reviewed the following labs and images: CBC: Recent Labs  Lab 07/19/20 1750 07/19/20 2139 07/20/20 0318  WBC 9.1 10.6* 9.4  HGB 10.6* 10.3* 9.5*  HCT 33.5* 32.0* 30.1*  MCV 88.9 88.6 89.9  PLT 88* 88* 84*   BMP &GFR Recent Labs  Lab 07/19/20 1750 07/19/20 2032 07/19/20 2139 07/20/20 0318  NA 138  --   --  136  K 3.2*  --   --  3.5  CL 101  --   --  101  CO2 24  --   --  23  GLUCOSE 97  --   --  153*  BUN 40*  --   --  47*  CREATININE 3.77*  --  3.98* 3.98*  CALCIUM 9.8  --   --  9.4  MG  --  2.2  --   --    Estimated Creatinine Clearance: 15.9 mL/min (A) (by C-G formula based on SCr of 3.98 mg/dL (H)). Liver & Pancreas: No results for input(s): AST, ALT, ALKPHOS, BILITOT, PROT, ALBUMIN in the  last 168 hours. No results for input(s): LIPASE, AMYLASE in the last 168 hours. No results for input(s): AMMONIA in the last 168 hours. Diabetic: No results for input(s): HGBA1C in the last 72 hours. No results for input(s): GLUCAP in the last 168 hours. Cardiac Enzymes: No results for input(s): CKTOTAL, CKMB, CKMBINDEX, TROPONINI in the last 168 hours. No results for input(s): PROBNP in the last 8760 hours. Coagulation Profile: No results for input(s): INR, PROTIME in the last 168 hours. Thyroid Function Tests: No results for input(s): TSH, T4TOTAL, FREET4, T3FREE, THYROIDAB in the last 72 hours. Lipid Profile: No results for input(s): CHOL, HDL, LDLCALC, TRIG, CHOLHDL, LDLDIRECT in the last 72 hours. Anemia Panel: No results for input(s): VITAMINB12, FOLATE, FERRITIN, TIBC, IRON, RETICCTPCT in the last 72 hours. Urine analysis:    Component Value Date/Time   COLORURINE YELLOW 06/12/2020 2223   APPEARANCEUR HAZY (A) 06/12/2020 2223   LABSPEC 1.010 06/12/2020 2223   PHURINE 6.0 06/12/2020 2223   GLUCOSEU NEGATIVE 06/12/2020 2223   HGBUR MODERATE (A) 06/12/2020 2223   BILIRUBINUR NEGATIVE 06/12/2020 2223   KETONESUR NEGATIVE 06/12/2020 2223   PROTEINUR 100 (A)  06/12/2020 2223   UROBILINOGEN 0.2 01/06/2014 1848   NITRITE NEGATIVE 06/12/2020 2223   LEUKOCYTESUR MODERATE (A) 06/12/2020 2223   Sepsis Labs: Invalid input(s): PROCALCITONIN, [place]  Microbiology: Recent Results (from the past 240 hour(s))  Respiratory Panel by RT PCR (Flu A&B, Covid) - Nasopharyngeal Swab     Status: None   Collection Time: 07/19/20  9:25 PM   Specimen: Nasopharyngeal Swab  Result Value Ref Range Status   SARS Coronavirus 2 by RT PCR NEGATIVE NEGATIVE Final    Comment: (NOTE) SARS-CoV-2 target nucleic acids are NOT DETECTED.  The SARS-CoV-2 RNA is generally detectable in upper respiratoy specimens during the acute phase of infection. The lowest concentration of SARS-CoV-2 viral copies this assay can detect is 131 copies/mL. A negative result does not preclude SARS-Cov-2 infection and should not be used as the sole basis for treatment or other patient management decisions. A negative result may occur with  improper specimen collection/handling, submission of specimen other than nasopharyngeal swab, presence of viral mutation(s) within the areas targeted by this assay, and inadequate number of viral copies (<131 copies/mL). A negative result must be combined with clinical observations, patient history, and epidemiological information. The expected result is Negative.  Fact Sheet for Patients:  PinkCheek.be  Fact Sheet for Healthcare Providers:  GravelBags.it  This test is no t yet approved or cleared by the [place] FDA and  has been authorized for detection and/or diagnosis of SARS-CoV-2 by FDA under an Emergency Use Authorization (EUA). This EUA will remain  in effect (meaning this test can be used) for the duration of the COVID-19 declaration under Section 564(b)(1) of the Act, 21 U.S.C. section 360bbb-3(b)(1), unless the authorization is terminated or revoked sooner.     Influenza A  by PCR NEGATIVE NEGATIVE Final   Influenza B by PCR NEGATIVE NEGATIVE Final    Comment: (NOTE) The Xpert Xpress SARS-CoV-2/FLU/RSV assay is intended as an aid in  the diagnosis of influenza from Nasopharyngeal swab specimens and  should not be used as a sole basis for treatment. Nasal washings and  aspirates are unacceptable for Xpert Xpress SARS-CoV-2/FLU/RSV  testing.  Fact Sheet for Patients: PinkCheek.be  Fact Sheet for Healthcare Providers: GravelBags.it  This test is not yet approved or cleared by the [place] FDA and  has been authorized for detection and/or diagnosis  of SARS-CoV-2 by  FDA under an Emergency Use Authorization (EUA). This EUA will remain  in effect (meaning this test can be used) for the duration of the  Covid-19 declaration under Section 564(b)(1) of the Act, 21  U.S.C. section 360bbb-3(b)(1), unless the authorization is  terminated or revoked. Performed at [facility], [facility] [address], [place], [place] [address]     Radiology Studies: No results found.    Genevieve Arbaugh T. [place]  If 7PM-7AM, please contact night-coverage [email or web address] 07/20/2020, 9:46 AM

## 2020-07-20 NOTE — Consult Note (Signed)
Robin Navarro Admit Date: 07/19/2020 07/20/2020 Gean Quint Requesting Physician:  Mercy Riding, MD  HPI:  54 year old black female with past medical history of hypertension, possible CKD who presents to the ER with elevated blood pressure.  She was at our optometrist office today and was found to have a blood pressure of 240/110 along with papilledema therefore was sent over here.  She has not been taking amlodipine and hydralazine.  She stopped labetalol on her own because it made her feel bad.  Had a similar presentation in September with an AKI and elevated blood pressures which improved slightly (peak Cr of 3.4 at that time). Also has proteinuria with normal SPEP in the past. Otherwise, she feels fine.  Denies any headaches, changes in vision, change in urinary frequency, shortness of breath, chest pain, dizziness, hematuria, epistaxis, hemoptysis, nausea/vomiting/diarrhea. Interestingly, sister has a similar issue.  PMH Incudes: Past Medical History:  Diagnosis Date  . Anemia   . Chronic kidney disease   . Hypertension        Creatinine, Ser (mg/dL)  Date Value  07/20/2020 3.98 (H)  07/19/2020 3.98 (H)  07/19/2020 3.77 (H)  06/15/2020 2.65 (H)  06/14/2020 2.94 (H)  06/13/2020 3.25 (H)  06/12/2020 3.37 (H)  01/06/2014 0.72  02/12/2013 0.99  02/11/2013 0.76  ] I/Os:  ROS Balance of 12 systems is negative w/ exceptions as above  PMH  Past Medical History:  Diagnosis Date  . Anemia   . Chronic kidney disease   . Hypertension    PSH  Past Surgical History:  Procedure Laterality Date  . NO PAST SURGERIES     FH  Family History  Problem Relation Age of Onset  . Diabetes Mother   . Hypertension Mother    SH  reports that she has been smoking cigars. She has been smoking about 0.01 packs per day. She has never used smokeless tobacco. She reports current alcohol use. She reports current drug use. Drug: Marijuana. Allergies No Known Allergies Home  medications Prior to Admission medications   Medication Sig Start Date End Date Taking? Authorizing Provider  acetaminophen (TYLENOL) 500 MG tablet Take 500 mg by mouth as needed for mild pain or moderate pain.   Yes [provider]  hydrALAZINE (APRESOLINE) 25 MG tablet Take 1 tablet (25 mg total) by mouth 3 (three) times daily. 06/15/20  Yes Rai, Ripudeep K, MD  labetalol (NORMODYNE) 200 MG tablet Take 1 tablet (200 mg total) by mouth 2 (two) times daily. 06/15/20  Yes Rai, Ripudeep K, MD  albuterol (PROVENTIL HFA;VENTOLIN HFA) 108 (90 BASE) MCG/ACT inhaler Inhale 2 puffs into the lungs every 6 (six) hours as needed for wheezing. Patient not taking: Reported on 02/07/2017 02/12/13   Rai, Vernelle Emerald, MD  amLODipine (NORVASC) 10 MG tablet Take 1 tablet (10 mg total) by mouth daily. Patient not taking: Reported on 07/19/2020 06/15/20   Mendel Corning, MD    Current Medications Scheduled Meds: . amLODipine  10 mg Oral Daily  . carvedilol  12.5 mg Oral BID WC  . hydrALAZINE  25 mg Oral TID  . potassium chloride  40 mEq Oral BID   Continuous Infusions: PRN Meds:.acetaminophen **OR** acetaminophen, labetalol, ondansetron (ZOFRAN) IV, senna-docusate  CBC Recent Labs  Lab 07/19/20 1750 07/19/20 2139 07/20/20 0318  WBC 9.1 10.6* 9.4  HGB 10.6* 10.3* 9.5*  HCT 33.5* 32.0* 30.1*  MCV 88.9 88.6 89.9  PLT 88* 88* 84*   Basic Metabolic Panel Recent Labs  Lab  07/19/20 1750 07/19/20 2139 07/20/20 0318  NA 138  --  136  K 3.2*  --  3.5  CL 101  --  101  CO2 24  --  23  GLUCOSE 97  --  153*  BUN 40*  --  47*  CREATININE 3.77* 3.98* 3.98*  CALCIUM 9.8  --  9.4    Physical Exam  Blood pressure (!) 168/107, pulse 90, temperature 98.6 F (37 C), temperature source Oral, resp. rate (!) 25, height 4\' 11"  (1.499 m), weight 87.1 kg, last menstrual period 12/23/2013, SpO2 96 %. GEN: wdwn, sitting in bed, nad ENT: no nasal discharge, mmm EYES: no scleral icterus, eomi CV: normal rate,  no murmurs PULM: no iwob, bilateral chest rise ABD: NABS, non-distended SKIN: no rashes or jaundice EXT: no edema, warm and well perfused   Assessment 1.  Hypertensive emergency, suspecting this is from nonadherence 2.  AKI, unsure what her actual baseline is but suspect that she does have a degree of CKD.  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 Anemia, normocytic 4.  Thrombocytopenia 5.  Papilledema 6. Hypokalemia 7. Proteinuria  Plan 1. Given malignant HTN, new thrombocytopenia, anemia-- Hemolysis workup 2. Renal ultrasound with duplex studies. Typically hypokalemic historically, bicarb has been ok. Not suspecting an aldo/renin issue, not typical of pheochromocytoma (can evaluate that as an outpatient). Replete K +mag as needed 3. UA w/ microscopy 4. No indication for renal replacement therapy. Would hold off on a biopsy for now 5. Switch labetalol to Coreg, continue with home amlodipine. BP slowly improving 6. Avoid nephrotoxic medications including NSAIDs and iodinated intravenous contrast exposure unless the latter is absolutely indicated.  Preferred narcotic agents for pain control are hydromorphone, fentanyl, and methadone. Morphine should not be used. Avoid Baclofen and avoid oral sodium phosphate and magnesium citrate based laxatives / bowel preps. Continue strict Input and Output monitoring. Will monitor the patient closely with you and intervene or adjust therapy as indicated by changes in clinical status/labs    Gean Quint, MD [place] 07/20/2020, 11:50 AM

## 2020-07-20 NOTE — Progress Notes (Signed)
  Echocardiogram 2D Echocardiogram has been performed.  Robin Navarro 07/20/2020, 9:31 AM

## 2020-07-21 ENCOUNTER — Inpatient Hospital Stay (HOSPITAL_COMMUNITY): Payer: Self-pay

## 2020-07-21 DIAGNOSIS — I10 Essential (primary) hypertension: Secondary | ICD-10-CM

## 2020-07-21 LAB — LIPID PANEL
Cholesterol: 140 mg/dL (ref 0–200)
HDL: 40 mg/dL — ABNORMAL LOW (ref >40)
LDL Cholesterol: 77 mg/dL (ref 0–99)
Total CHOL/HDL Ratio: 3.5 RATIO
Triglycerides: 115 mg/dL (ref <150)
VLDL: 23 mg/dL (ref 0–40)

## 2020-07-21 LAB — MICROALBUMIN / CREATININE URINE RATIO
Creatinine, Urine: 94 mg/dL
Microalb Creat Ratio: 693 mg/g creat — ABNORMAL HIGH (ref 0–29)
Microalb, Ur: 651.5 ug/mL — ABNORMAL HIGH

## 2020-07-21 LAB — RENAL FUNCTION PANEL
Albumin: 3 g/dL — ABNORMAL LOW (ref 3.5–5.0)
Anion gap: 8 (ref 5–15)
BUN: 45 mg/dL — ABNORMAL HIGH (ref 6–20)
CO2: 25 mmol/L (ref 22–32)
Calcium: 9.3 mg/dL (ref 8.9–10.3)
Chloride: 105 mmol/L (ref 98–111)
Creatinine, Ser: 3.79 mg/dL — ABNORMAL HIGH (ref 0.44–1.00)
GFR, Estimated: 13 mL/min — ABNORMAL LOW (ref >60)
Glucose, Bld: 119 mg/dL — ABNORMAL HIGH (ref 70–99)
Phosphorus: 3.3 mg/dL (ref 2.5–4.6)
Potassium: 4 mmol/L (ref 3.5–5.1)
Sodium: 138 mmol/L (ref 135–145)

## 2020-07-21 LAB — CBC
HCT: 27.4 % — ABNORMAL LOW (ref 36.0–46.0)
Hemoglobin: 8.6 g/dL — ABNORMAL LOW (ref 12.0–15.0)
MCH: 28 pg (ref 26.0–34.0)
MCHC: 31.4 g/dL (ref 30.0–36.0)
MCV: 89.3 fL (ref 80.0–100.0)
Platelets: 102 10*3/uL — ABNORMAL LOW (ref 150–400)
RBC: 3.07 MIL/uL — ABNORMAL LOW (ref 3.87–5.11)
RDW: 16.8 % — ABNORMAL HIGH (ref 11.5–15.5)
WBC: 7.2 10*3/uL (ref 4.0–10.5)
nRBC: 0 % (ref 0.0–0.2)

## 2020-07-21 LAB — MAGNESIUM: Magnesium: 2 mg/dL (ref 1.7–2.4)

## 2020-07-21 LAB — HEPATITIS B SURFACE ANTIBODY, QUANTITATIVE: Hep B S AB Quant (Post): <3.1 m[IU]/mL — ABNORMAL LOW (ref >9.9)

## 2020-07-21 LAB — HAPTOGLOBIN: Haptoglobin: 22 mg/dL — ABNORMAL LOW (ref 33–346)

## 2020-07-21 MED ORDER — HYDRALAZINE HCL 50 MG PO TABS
50.0000 mg | ORAL_TABLET | Freq: Three times a day (TID) | ORAL | Status: DC
  Administered 2020-07-21 – 2020-07-22 (×4): 50 mg via ORAL
  Filled 2020-07-21 (×4): qty 1

## 2020-07-21 NOTE — Progress Notes (Signed)
CCMD has reported a 2.59 second pause to this RN from earlier during this shift.

## 2020-07-21 NOTE — Progress Notes (Signed)
Renal artery duplex completed. Refer to "CV Proc" under chart review to view preliminary results.  07/21/2020 10:44 AM Kelby Aline., MHA, RVT, RDCS, RDMS

## 2020-07-21 NOTE — Progress Notes (Signed)
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  Patient presented to optometry for ongoing left eye vision issue/central scotoma for months.  Patient stopped taking her labetalol as it made her "jittery".  Reports taking amlodipine and hydralazine for most part.  "Trying" to watch his salt intake.  She reports snoring but denies daytime sleepiness or fatigue.  She had normal TSH and aldosterone/renin activity last months.  UPEP without M spike.  No significant proteinuria.  He had renal ultrasound was consistent with CKD.  Never seen nephrology or cardiology.  In ED, BP as high as 247/170. Cr 3.77 (2.65 on 9/13).  BUN 40.  K3.2. HS trop 26> 26.  Hgb 10.6 (baseline).  Platelet 88 (188 on 9/10).  Quantitative hCG 7.3.  Influenza and COVID-19 PCR negative.  Echocardiogram ordered.  Started on IV and p.o. oral antihypertensive medications, and admitted for hypertensive emergency and AKI on CKD-4.  The next day, blood pressure improved.  Renal function slightly worse. Nephrology consulted.  Subjective: Seen and examined earlier this morning. No major events overnight of this morning. Reports improvement in her vision. No complaints. Denies headache, focal neuro symptoms, chest pain, dyspnea, GI or UTI symptoms.  Objective: Vitals:   07/21/20 0037 07/21/20 0546 07/21/20 0804 07/21/20 1114  BP: (!) 148/98 (!) 158/102 (!) 175/116 (!) 158/103  Pulse: 84 86 77 72  Resp: 16 16 20 20   Temp: 98.8 F (37.1 C) 98.3 F (36.8 C) 98.1 F (36.7 C) 98.3 F (36.8  C)  TempSrc: Oral Oral Oral Oral  SpO2: 100% 100% 97% 100%  Weight:  87.2 kg    Height:        Intake/Output Summary (Last 24 hours) at 07/21/2020 1147 Last data filed at 07/21/2020 0812 Gross per 24 hour  Intake 240 ml  Output 350 ml  Net -110 ml   Filed Weights   07/19/20 1845 07/20/20 1428 07/21/20 0546  Weight: 87.1 kg 87.1 kg 87.2 kg    Examination:  GENERAL: No apparent distress.  Nontoxic. HEENT: MMM.  Vision grossly intact in all fields. NECK: Supple.  No apparent JVD.  RESP:  No IWOB.  Fair aeration bilaterally. CVS:  RRR. Heart sounds normal.  ABD/GI/GU: BS+. Abd soft, NTND.  MSK/EXT:  Moves extremities. No apparent deformity. No edema.  SKIN: no apparent skin lesion or wound NEURO: Awake, alert and oriented appropriately.  No apparent focal neuro deficit. PSYCH: Calm. Normal affect.  Procedures:  None  Microbiology summarized: Influenza and COVID-19 PCR negative.  Assessment & Plan: Hypertensive emergency: BP 247/170 on admit.  Likely due to noncompliance with medication and dietary indiscretion.  Need to exclude secondary causes.  Work-up in 06/2020 with normal TSH, aldosterone/renin, no M spike on UPEP and no significant proteinuria.  Renal US consistent with CKD. Renal duplex without renal artery stenosis. Echo basically normal. Denies NSAID use.  Blood pressure improved. -Needs outpatient sleep study-stop bang score 3-high risk for OSA -Continue amlodipine 10 mg daily, Coreg 12.5 mg twice daily -Increase hydralazine to 50 mg 3 times daily -Add as needed IV labetalol -Appreciate guidance by nephrology -  May need ambulatory referral to Dr. Blenda [facility] clinic (hypertension clinic).  AKI on CKD-4 with mild azotemia: 2.65 on discharge last month >Cr 3.77 this admission> 3.98> 3.79. 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 She was on lisinopril/HCTZ prior to previous admission that was discontinued on discharge but not sure if she is still taking or not.  Nephropathy/GN work-up unrevealing so far. -Appreciate help by nephrology  Hypokalemia: Not sure if she is still taking it lisinopril/HCTZ.  Her aldosterone/renin ratio was within normal last month. Hypokalemia resolved.  Mildly elevated troponin: Likely demand ischemia from uncontrolled hypertension. -Follow echocardiogram.  Doubt need for further work-up.  Anemia of renal disease: Recent baseline Hgb 9-10> 10.6> 9.5> 8.6. Denies melena or hematochezia. Normal anemia panel last month. LDH elevated. She has thrombocytopenia. Hemolytic work-up pending.  -Continue monitoring  Thrombocytopenia: Platelet 88>> 84 (188 last month)> 130.  No signs of bleeding. -Follow HIT antibody and hemolytic work-up -Continue SCD for VT prophylaxis  Papilledema/visual disturbance/scotoma in left eye: Not acute.  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 Improved. Vision grossly intact in all visual fields today. -Optimize blood pressure control -Needs ophthalmology evaluation outpatient.  Class II obesity Body mass index is 38.84 kg/m.  -Encourage lifestyle change to lose weight.       DVT prophylaxis:  Place and maintain sequential compression device Start: 07/20/20 1021  Code Status: Full code Family Communication: Patient and/or RN. Available if any question.  Status is: Inpatient  Remains inpatient appropriate because:Hemodynamically unstable, Ongoing diagnostic testing needed not appropriate for outpatient work up and Inpatient level of care appropriate due to severity of illness   Dispo: The patient is from: Home              Anticipated d/c is to: Home              Anticipated d/c date is: 2 days              Patient currently is not medically stable to d/c.       Consultants:  Nephrology   Sch Meds:  Scheduled Meds: . amLODipine  10 mg Oral Daily  . carvedilol  12.5 mg Oral BID WC  . hydrALAZINE  50 mg Oral TID   Continuous Infusions: PRN Meds:.acetaminophen **OR**  acetaminophen, labetalol, ondansetron (ZOFRAN) IV, senna-docusate  Antimicrobials: Anti-infectives (From admission, onward)   None       I have personally reviewed the following labs and images: CBC: Recent Labs  Lab 07/19/20 1750 07/19/20 2139 07/20/20 0318 07/21/20 0235  WBC 9.1 10.6* 9.4 7.2  HGB 10.6* 10.3* 9.5* 8.6*  HCT 33.5* 32.0* 30.1* 27.4*  MCV 88.9 88.6 89.9 89.3  PLT 88* 88* 84* 102*   BMP &GFR Recent Labs  Lab 07/19/20 1750 07/19/20 2032 07/19/20 2139 07/20/20 0318 07/21/20 0235  NA 138  --   --  136 138  K 3.2*  --   --  3.5 4.0  CL 101  --   --  101 105  CO2 24  --   --  23 25  GLUCOSE 97  --   --  153* 119*  BUN 40*  --   --  47* 45*  CREATININE 3.77*  --  3.98* 3.98* 3.79*  CALCIUM 9.8  --   --  9.4 9.3  MG  --  2.2  --   --  2.0  PHOS  --   --   --   --  3.3   Estimated Creatinine Clearance: 16.7 mL/min (A) (  by C-G formula based on SCr of 3.79 mg/dL (H)). Liver & Pancreas: Recent Labs  Lab 07/21/20 0235  ALBUMIN 3.0*   No results for input(s): LIPASE, AMYLASE in the last 168 hours. No results for input(s): AMMONIA in the last 168 hours. Diabetic: No results for input(s): HGBA1C in the last 72 hours. No results for input(s): GLUCAP in the last 168 hours. Cardiac Enzymes: No results for input(s): CKTOTAL, CKMB, CKMBINDEX, TROPONINI in the last 168 hours. No results for input(s): PROBNP in the last 8760 hours. Coagulation Profile: No results for input(s): INR, PROTIME in the last 168 hours. Thyroid Function Tests: No results for input(s): TSH, T4TOTAL, FREET4, T3FREE, THYROIDAB in the last 72 hours. Lipid Profile: Recent Labs    07/21/20 0235  CHOL 140  HDL 40*  LDLCALC 77  TRIG 115  CHOLHDL 3.5   Anemia Panel: No results for input(s): VITAMINB12, FOLATE, FERRITIN, TIBC, IRON, RETICCTPCT in the last 72 hours. Urine analysis:    Component Value Date/Time   COLORURINE YELLOW 07/20/2020 1554   APPEARANCEUR HAZY (A) 07/20/2020  1554   LABSPEC 1.011 07/20/2020 1554   PHURINE 5.0 07/20/2020 1554   GLUCOSEU NEGATIVE 07/20/2020 1554   HGBUR NEGATIVE 07/20/2020 [place] 07/20/2020 1554   KETONESUR NEGATIVE 07/20/2020 1554   PROTEINUR 100 (A) 07/20/2020 1554   UROBILINOGEN 0.2 01/06/2014 1848   NITRITE NEGATIVE 07/20/2020 1554   LEUKOCYTESUR NEGATIVE 07/20/2020 1554   Sepsis Labs: Invalid input(s): PROCALCITONIN, [place]  Microbiology: Recent Results (from the past 240 hour(s))  Respiratory Panel by RT PCR (Flu A&B, Covid) - Nasopharyngeal Swab     Status: None   Collection Time: 07/19/20  9:25 PM   Specimen: Nasopharyngeal Swab  Result Value Ref Range Status   SARS Coronavirus 2 by RT PCR NEGATIVE NEGATIVE Final    Comment: (NOTE) SARS-CoV-2 target nucleic acids are NOT DETECTED.  The SARS-CoV-2 RNA is generally detectable in upper respiratoy specimens during the acute phase of infection. The lowest concentration of SARS-CoV-2 viral copies this assay can detect is 131 copies/mL. A negative result does not preclude SARS-Cov-2 infection and should not be used as the sole basis for treatment or other patient management decisions. A negative result may occur with  improper specimen collection/handling, submission of specimen other than nasopharyngeal swab, presence of viral mutation(s) within the areas targeted by this assay, and inadequate number of viral copies (<131 copies/mL). A negative result must be combined with clinical observations, patient history, and epidemiological information. The expected result is Negative.  Fact Sheet for Patients:  PinkCheek.be  Fact Sheet for Healthcare Providers:  GravelBags.it  This test is no t yet approved or cleared by the [place] FDA and  has been authorized for detection and/or diagnosis of SARS-CoV-2 by FDA under an Emergency Use Authorization (EUA). This EUA will remain    in effect (meaning this test can be used) for the duration of the COVID-19 declaration under Section 564(b)(1) of the Act, 21 U.S.C. section 360bbb-3(b)(1), unless the authorization is terminated or revoked sooner.     Influenza A by PCR NEGATIVE NEGATIVE Final   Influenza B by PCR NEGATIVE NEGATIVE Final    Comment: (NOTE) The Xpert Xpress SARS-CoV-2/FLU/RSV assay is intended as an aid in  the diagnosis of influenza from Nasopharyngeal swab specimens and  should not be used as a sole basis for treatment. Nasal washings and  aspirates are unacceptable for Xpert Xpress SARS-CoV-2/FLU/RSV  testing.  Fact Sheet for Patients: PinkCheek.be  Fact Sheet for Healthcare Providers: GravelBags.it  This test is not yet approved or cleared by the [place] FDA and  has been authorized for detection and/or diagnosis of SARS-CoV-2 by  FDA under an Emergency Use Authorization (EUA). This EUA will remain  in effect (meaning this test can be used) for the duration of the  Covid-19 declaration under Section 564(b)(1) of the Act, 21  U.S.C. section 360bbb-3(b)(1), unless the authorization is  terminated or revoked. Performed at [facility], [place] [address] [place], [place] [address]     Radiology Studies: VAS US RENAL ARTERY DUPLEX  Result Date: 07/21/2020 ABDOMINAL VISCERAL Indications: Resistant hypertension Comparison Study: No prior study Performing Technologist: Maudry Mayhew MHA, RDMS, RVT, RDCS  Examination Guidelines: A complete evaluation includes B-mode imaging, spectral Doppler, color Doppler, and power Doppler as needed of all accessible portions of each vessel. Bilateral testing is considered an integral part of a complete examination. Limited examinations for reoccurring indications may be performed as noted.  Duplex Findings: +--------------------+--------+--------+------+--------+ Mesenteric          PSV  cm/sEDV cm/sPlaqueComments +--------------------+--------+--------+------+--------+ Aorta Prox            111      13                  +--------------------+--------+--------+------+--------+ Celiac Artery Origin  100                          +--------------------+--------+--------+------+--------+ SMA Proximal          157      15                  +--------------------+--------+--------+------+--------+    +------------------+--------+--------+-------+ Right Renal ArteryPSV cm/sEDV cm/sComment +------------------+--------+--------+-------+ Origin               60      8            +------------------+--------+--------+-------+ Proximal             36      7            +------------------+--------+--------+-------+ Mid                  16      6            +------------------+--------+--------+-------+ Distal               17      6            +------------------+--------+--------+-------+ +-----------------+--------+--------+-------+ Left Renal ArteryPSV cm/sEDV cm/sComment +-----------------+--------+--------+-------+ Origin             121      19           +-----------------+--------+--------+-------+ Proximal            30      8            +-----------------+--------+--------+-------+ Mid                 28      8            +-----------------+--------+--------+-------+ Distal              37      14           +-----------------+--------+--------+-------+ +------------+--------+--------+----+-----------+--------+--------+----+ Right KidneyPSV cm/sEDV cm/sRI  Left KidneyPSV cm/sEDV cm/sRI   +------------+--------+--------+----+-----------+--------+--------+----+ Upper Pole  14  5       0.67Upper Pole 12      3       0.73 +------------+--------+--------+----+-----------+--------+--------+----+ Mid         10      4       0.61Mid        14      4       0.72  +------------+--------+--------+----+-----------+--------+--------+----+ Lower Pole  13      4       0.66Lower Pole 12      4       0.70 +------------+--------+--------+----+-----------+--------+--------+----+ Hilar       14      5       0.67Hilar      19      5       0.74 +------------+--------+--------+----+-----------+--------+--------+----+ +------------------+---------+------------------+---------+ Right Kidney               Left Kidney                 +------------------+---------+------------------+---------+ RAR                        RAR                         +------------------+---------+------------------+---------+ RAR (manual)      0.54     RAR (manual)      1.09      +------------------+---------+------------------+---------+ Cortex            15/4 cm/sCortex            16/5 cm/s +------------------+---------+------------------+---------+ Cortex thickness           Corex thickness             +------------------+---------+------------------+---------+ Kidney length (cm)9.70     Kidney length (cm)9.53      +------------------+---------+------------------+---------+  Summary: Renal:  Right: No evidence of right renal artery stenosis. RRV flow present. Left:  No evidence of left renal artery stenosis. LRV flow present.  Diffusely increased echogenicity within bilateral renal parenchyma; consistent with medical renal disease. Correlates with previous renal ultrasound 06/13/2020.  *See table(s) above for measurements and observations.     Preliminary       Teofil Maniaci T. [place]  If 7PM-7AM, please contact night-coverage [email or web address] 07/21/2020, 11:47 AM

## 2020-07-21 NOTE — Discharge Instructions (Signed)

## 2020-07-21 NOTE — Progress Notes (Addendum)
[facility] Progress Note    Assessment/ Plan:   1. Hypertensive emergency, improved. Renal artery duplex negative for RAS. Continue with amlodipine 10mg  daily, coreg 12.5mg  bid, hydralazine 50mg  tid. Hydralazine just increased, if needed can titrate this up to 100mg  TID. Sodium restriction in diet 2. Acute kidney injury. Unsure at this time if she actually has CKD (will need data with time to determine this). 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 No need for a biopsy as of right now especially given that she is hypertensive and management as of right now will not change. Diffuse echogencity on ultrasound, no overt abnormalities, neg RAS. Kidney function relatively stable/slightly better today. 3. Proteinuria. FLC pending, neg SPEP in the past. UPC only 0.9g, HIV, Hep panel negative. Suspecting she may have some underlying FSGS +/- APOL1 gene mutation given family history 4. Anemia, hgb down to 8.6. Hemolytic work up pending, recommend smear review 5. Thrombocytopenia. TMA picture? Platelet count improved 6. Hyperglycemia, consider evaluating her for diabetes 7. Hypokalemia, resolved. Stopping KCl supplements 8. Obesity: recommend outpatient sleep study  Subjective:   No acute events, bp better. Patient feels well, no complaints..   Objective:   BP (!) 158/103 (BP Location: Right Arm)   Pulse 72   Temp 98.3 F (36.8 C) (Oral)   Resp 20   Ht 4\' 11"  (1.499 m)   Wt 87.2 kg   LMP 12/23/2013   SpO2 100%   BMI 38.84 kg/m   Intake/Output Summary (Last 24 hours) at 07/21/2020 1129 Last data filed at 07/21/2020 3428 Gross per 24 hour  Intake 240 ml  Output 350 ml  Net -110 ml   Weight change: 0.009 kg  Physical Exam: Gen: nad, comfortable appearing CVS:s1s2, rrr, no m/r/g Resp:cta bl, no w/r/r/c, unlabored, bl chest expansion JGO:TLXBW, soft, nt/nd Ext:no  edema Skin: no rash Neuro: nonfocal  Imaging: ECHOCARDIOGRAM COMPLETE  Result Date: 07/20/2020    ECHOCARDIOGRAM REPORT   Patient Name:   Robin Navarro Date of Exam: 07/20/2020 Medical Rec #:  [id number]          Height:       59.0 in Accession #:    [id number]         Weight:       192.0 lb Date of Birth:  1966-08-31           BSA:          1.813 m Patient Age:    54 years           BP:           153/101 mmHg Patient Gender: F                  HR:           95 bpm. Exam Location:  Inpatient Procedure: 2D Echo Indications:    abnormal ecg 794.31  History:        Patient has no prior history of Echocardiogram examinations.                 Signs/Symptoms:elevated troponin; Risk Factors:Hypertension.  Sonographer:    Johny Chess Referring Phys: [id number] [place]  1. Left ventricular ejection fraction, by estimation, is 60 to 65%. The left ventricle has normal function. The left ventricle has no regional wall motion abnormalities. There is moderate left ventricular hypertrophy. Left ventricular diastolic parameters were  normal.  2. Right ventricular systolic function is normal. The right ventricular size is normal. Tricuspid regurgitation signal is inadequate for assessing PA pressure.  3. The mitral valve is normal in structure. No evidence of mitral valve regurgitation. No evidence of mitral stenosis.  4. The aortic valve is tricuspid. Aortic valve regurgitation is not visualized. No aortic stenosis is present.  5. The inferior vena cava is normal in size with greater than 50% respiratory variability, suggesting right atrial pressure of 3 mmHg. FINDINGS  Left Ventricle: Left ventricular ejection fraction, by estimation, is 60 to 65%. The left ventricle has normal function. The left ventricle has no regional wall motion abnormalities. The left ventricular internal cavity size was normal in size. There is  moderate left ventricular hypertrophy. Left ventricular diastolic parameters  were normal. Right Ventricle: The right ventricular size is normal.Right ventricular systolic function is normal. Tricuspid regurgitation signal is inadequate for assessing PA pressure. The tricuspid regurgitant velocity is 2.24 m/s, and with an assumed right atrial pressure of 3 mmHg, the estimated right ventricular systolic pressure is 16.1 mmHg. Left Atrium: Left atrial size was normal in size. Right Atrium: Right atrial size was normal in size. Pericardium: There is no evidence of pericardial effusion. Mitral Valve: The mitral valve is normal in structure. No evidence of mitral valve regurgitation. No evidence of mitral valve stenosis. Tricuspid Valve: The tricuspid valve is normal in structure. Tricuspid valve regurgitation is trivial. No evidence of tricuspid stenosis. Aortic Valve: The aortic valve is tricuspid. Aortic valve regurgitation is not visualized. No aortic stenosis is present. Pulmonic Valve: The pulmonic valve was normal in structure. Pulmonic valve regurgitation is not visualized. No evidence of pulmonic stenosis. Aorta: The aortic root is normal in size and structure. Venous: The inferior vena cava is normal in size with greater than 50% respiratory variability, suggesting right atrial pressure of 3 mmHg. IAS/Shunts: No atrial level shunt detected by color flow Doppler.  LEFT VENTRICLE PLAX 2D LVIDd:         4.80 cm  Diastology LVIDs:         3.60 cm  LV e' medial:   10.40 cm/s LV PW:         1.40 cm  LV E/e' medial: 8.4 LV IVS:        1.20 cm LVOT diam:     1.60 cm LV SV:         53 LV SV Index:   29 LVOT Area:     2.01 cm  RIGHT VENTRICLE             IVC RV S prime:     15.10 cm/s  IVC diam: 1.10 cm TAPSE (M-mode): 3.0 cm LEFT ATRIUM             Index       RIGHT ATRIUM           Index LA diam:        4.40 cm 2.43 cm/m  RA Area:     11.00 cm LA Vol (A2C):   72.4 ml 39.94 ml/m RA Volume:   24.00 ml  13.24 ml/m LA Vol (A4C):   50.2 ml 27.69 ml/m LA Biplane Vol: 62.4 ml 34.42 ml/m  AORTIC  VALVE LVOT Vmax:   157.00 cm/s LVOT Vmean:  107.000 cm/s LVOT VTI:    0.265 m  AORTA Ao Root diam: 2.70 cm Ao Asc diam:  2.70 cm MV E velocity: 86.90 cm/s   TRICUSPID VALVE MV A  velocity: 138.00 cm/s  TR Peak grad:   20.1 mmHg MV E/A ratio:  0.63         TR Vmax:        224.00 cm/s                              SHUNTS                             Systemic VTI:  0.26 m                             Systemic Diam: 1.60 cm Kirk Ruths MD Electronically signed by Kirk Ruths MD Signature Date/Time: 07/20/2020/12:02:14 PM    Final    VAS US RENAL ARTERY DUPLEX  Result Date: 07/21/2020 ABDOMINAL VISCERAL Indications: Resistant hypertension Comparison Study: No prior study Performing Technologist: Maudry Mayhew MHA, RDMS, RVT, RDCS  Examination Guidelines: A complete evaluation includes B-mode imaging, spectral Doppler, color Doppler, and power Doppler as needed of all accessible portions of each vessel. Bilateral testing is considered an integral part of a complete examination. Limited examinations for reoccurring indications may be performed as noted.  Duplex Findings: +--------------------+--------+--------+------+--------+ Mesenteric          PSV cm/sEDV cm/sPlaqueComments +--------------------+--------+--------+------+--------+ Aorta Prox            111      13                  +--------------------+--------+--------+------+--------+ Celiac Artery Origin  100                          +--------------------+--------+--------+------+--------+ SMA Proximal          157      15                  +--------------------+--------+--------+------+--------+    +------------------+--------+--------+-------+ Right Renal ArteryPSV cm/sEDV cm/sComment +------------------+--------+--------+-------+ Origin               60      8            +------------------+--------+--------+-------+ Proximal             36      7            +------------------+--------+--------+-------+ Mid                   16      6            +------------------+--------+--------+-------+ Distal               17      6            +------------------+--------+--------+-------+ +-----------------+--------+--------+-------+ Left Renal ArteryPSV cm/sEDV cm/sComment +-----------------+--------+--------+-------+ Origin             121      19           +-----------------+--------+--------+-------+ Proximal            30      8            +-----------------+--------+--------+-------+ Mid                 28      8            +-----------------+--------+--------+-------+ Distal  37      14           +-----------------+--------+--------+-------+ +------------+--------+--------+----+-----------+--------+--------+----+ Right KidneyPSV cm/sEDV cm/sRI  Left KidneyPSV cm/sEDV cm/sRI   +------------+--------+--------+----+-----------+--------+--------+----+ Upper Pole  14      5       0.67Upper Pole 12      3       0.73 +------------+--------+--------+----+-----------+--------+--------+----+ Mid         10      4       0.61Mid        14      4       0.72 +------------+--------+--------+----+-----------+--------+--------+----+ Lower Pole  13      4       0.66Lower Pole 12      4       0.70 +------------+--------+--------+----+-----------+--------+--------+----+ Hilar       14      5       0.67Hilar      19      5       0.74 +------------+--------+--------+----+-----------+--------+--------+----+ +------------------+---------+------------------+---------+ Right Kidney               Left Kidney                 +------------------+---------+------------------+---------+ RAR                        RAR                         +------------------+---------+------------------+---------+ RAR (manual)      0.54     RAR (manual)      1.09      +------------------+---------+------------------+---------+ Cortex            15/4 cm/sCortex             16/5 cm/s +------------------+---------+------------------+---------+ Cortex thickness           Corex thickness             +------------------+---------+------------------+---------+ Kidney length (cm)9.70     Kidney length (cm)9.53      +------------------+---------+------------------+---------+  Summary: Renal:  Right: No evidence of right renal artery stenosis. RRV flow present. Left:  No evidence of left renal artery stenosis. LRV flow present.  Diffusely increased echogenicity within bilateral renal parenchyma; consistent with medical renal disease. Correlates with previous renal ultrasound 06/13/2020.  *See table(s) above for measurements and observations.     Preliminary     Labs: BMET Recent Labs  Lab 07/19/20 1750 07/19/20 2139 07/20/20 0318 07/21/20 0235  NA 138  --  136 138  K 3.2*  --  3.5 4.0  CL 101  --  101 105  CO2 24  --  23 25  GLUCOSE 97  --  153* 119*  BUN 40*  --  47* 45*  CREATININE 3.77* 3.98* 3.98* 3.79*  CALCIUM 9.8  --  9.4 9.3  PHOS  --   --   --  3.3   CBC Recent Labs  Lab 07/19/20 1750 07/19/20 2139 07/20/20 0318 07/21/20 0235  WBC 9.1 10.6* 9.4 7.2  HGB 10.6* 10.3* 9.5* 8.6*  HCT 33.5* 32.0* 30.1* 27.4*  MCV 88.9 88.6 89.9 89.3  PLT 88* 88* 84* 102*    Medications:    . amLODipine  10 mg Oral Daily  . carvedilol  12.5 mg Oral BID WC  . hydrALAZINE  50 mg Oral TID  . potassium chloride  40  mEq Oral BID      Gean Quint, MD [facility] 07/21/2020, 11:29 AM

## 2020-07-21 NOTE — Progress Notes (Signed)
Nutrition Brief Note  RD consulted for diet education.   Wt Readings from Last 15 Encounters:  07/21/20 87.2 kg  06/12/20 88 kg  02/12/13 89.3 kg  02/09/12 87.5 kg  02/04/12 89.4 kg   Robin Navarro is a 54 y.o. female with medical history significant for hypertension and AKI. Was admitted last month for hypertensive emergency. She presents today from optometry office with report of papilledema and hypertensive urgency.  Pt admitted with hypertensive emergency and AKI.   Reviewed I/O's: -110 ml x 24 hours  UOP: 350 ml x 24 hours  Attempted to speak with pt via call to hospital room phone, however, unable to reach.   Per nephrology notes, suspect hypertensive crisis and AKI are related to medical non-adherence.  RD provided "Low Sodium Nutrition Therapy" handout from [facility] Nutrition Care Manual; attached to AVS/ discharge summary.   Reviewed wt hx; wt has been stable over the past month.    Medications reviewed and include KCl.   Labs reviewed.   Body mass index is 38.84 kg/m. Patient meets criteria for obesity, class II based on current BMI. Obesity is a complex, chronic medical condition that is optimally managed by a multidisciplinary care team. Weight loss is not an ideal goal for an acute inpatient hospitalization. However, if further work-up for obesity is warranted, consider outpatient referral to outpatient bariatric service and/or 's Nutrition and Diabetes Education Services.   Current diet order is NPO, patient is consuming approximately n/a% of meals at this time. Labs and medications reviewed.   No nutrition interventions warranted at this time. If nutrition issues arise, please consult RD.   Loistine Chance, RD, LDN, [place] Registered Dietitian II Certified Diabetes Care and Education Specialist Please refer to [facility] for RD and/or RD on-call/weekend/after hours pager

## 2020-07-22 ENCOUNTER — Other Ambulatory Visit (HOSPITAL_COMMUNITY): Payer: Self-pay | Admitting: Internal Medicine

## 2020-07-22 LAB — CBC
HCT: 28.1 % — ABNORMAL LOW (ref 36.0–46.0)
Hemoglobin: 8.7 g/dL — ABNORMAL LOW (ref 12.0–15.0)
MCH: 28.1 pg (ref 26.0–34.0)
MCHC: 31 g/dL (ref 30.0–36.0)
MCV: 90.6 fL (ref 80.0–100.0)
Platelets: 129 10*3/uL — ABNORMAL LOW (ref 150–400)
RBC: 3.1 MIL/uL — ABNORMAL LOW (ref 3.87–5.11)
RDW: 16.9 % — ABNORMAL HIGH (ref 11.5–15.5)
WBC: 7.5 10*3/uL (ref 4.0–10.5)
nRBC: 0 % (ref 0.0–0.2)

## 2020-07-22 LAB — RENAL FUNCTION PANEL
Albumin: 3.1 g/dL — ABNORMAL LOW (ref 3.5–5.0)
Anion gap: 10 (ref 5–15)
BUN: 42 mg/dL — ABNORMAL HIGH (ref 6–20)
CO2: 23 mmol/L (ref 22–32)
Calcium: 9.5 mg/dL (ref 8.9–10.3)
Chloride: 107 mmol/L (ref 98–111)
Creatinine, Ser: 3.58 mg/dL — ABNORMAL HIGH (ref 0.44–1.00)
GFR, Estimated: 14 mL/min — ABNORMAL LOW (ref >60)
Glucose, Bld: 104 mg/dL — ABNORMAL HIGH (ref 70–99)
Phosphorus: 3 mg/dL (ref 2.5–4.6)
Potassium: 3.9 mmol/L (ref 3.5–5.1)
Sodium: 140 mmol/L (ref 135–145)

## 2020-07-22 LAB — HEMOGLOBIN A1C
Hgb A1c MFr Bld: 5 % (ref 4.8–5.6)
Mean Plasma Glucose: 97 mg/dL

## 2020-07-22 LAB — KAPPA/LAMBDA LIGHT CHAINS
Kappa free light chain: 125.3 mg/L — ABNORMAL HIGH (ref 3.3–19.4)
Kappa, lambda light chain ratio: 1.84 — ABNORMAL HIGH (ref 0.26–1.65)
Lambda free light chains: 68.2 mg/L — ABNORMAL HIGH (ref 5.7–26.3)

## 2020-07-22 LAB — MAGNESIUM: Magnesium: 1.9 mg/dL (ref 1.7–2.4)

## 2020-07-22 MED ORDER — HYDRALAZINE HCL 50 MG PO TABS
50.0000 mg | ORAL_TABLET | Freq: Three times a day (TID) | ORAL | 2 refills | Status: DC
Start: 2020-07-22 — End: 2021-01-27

## 2020-07-22 MED ORDER — SENNOSIDES-DOCUSATE SODIUM 8.6-50 MG PO TABS: 1.0000 | ORAL_TABLET | Freq: Every evening | ORAL | 0 refills | Status: DC | PRN

## 2020-07-22 MED ORDER — HYDRALAZINE HCL 50 MG PO TABS: 50.0000 mg | ORAL_TABLET | Freq: Three times a day (TID) | ORAL | 2 refills | Status: DC

## 2020-07-22 MED ORDER — CHLORTHALIDONE 25 MG PO TABS
25.0000 mg | ORAL_TABLET | Freq: Every day | ORAL | 2 refills | Status: DC
Start: 2020-07-22 — End: 2020-07-22

## 2020-07-22 MED ORDER — CARVEDILOL 12.5 MG PO TABS
12.5000 mg | ORAL_TABLET | Freq: Two times a day (BID) | ORAL | 2 refills | Status: DC
Start: 2020-07-22 — End: 2020-07-22

## 2020-07-22 MED ORDER — CHLORTHALIDONE 25 MG PO TABS
12.5000 mg | ORAL_TABLET | Freq: Every day | ORAL | Status: DC
  Administered 2020-07-22: 12.5 mg via ORAL
  Filled 2020-07-22: qty 0.5

## 2020-07-22 MED ORDER — AMLODIPINE BESYLATE 10 MG PO TABS: 10.0000 mg | ORAL_TABLET | Freq: Every day | ORAL | 3 refills | Status: DC

## 2020-07-22 MED ORDER — CARVEDILOL 12.5 MG PO TABS: 12.5000 mg | ORAL_TABLET | Freq: Two times a day (BID) | ORAL | 2 refills | Status: DC

## 2020-07-22 MED ORDER — CHLORTHALIDONE 25 MG PO TABS
25.0000 mg | ORAL_TABLET | Freq: Every day | ORAL | 2 refills | Status: DC
Start: 2020-07-22 — End: 2021-01-27

## 2020-07-22 MED FILL — CARVEDILOL 12.5 MG TABLET: 12.5 | 30 days supply | Qty: 60 | Fill #0

## 2020-07-22 MED FILL — hydrALAZINE HCL 50 MG TABS: 50 | 30 days supply | Qty: 90 | Fill #0

## 2020-07-22 MED FILL — CHLORTHALIDONE 25 MG TAB: 25 | 30 days supply | Qty: 30 | Fill #0

## 2020-07-22 MED FILL — SENEXON-S 8.6-50 MG TABS: 8.6-50 | 10 days supply | Qty: 10 | Fill #0

## 2020-07-22 NOTE — Progress Notes (Signed)
[facility] Progress Note    Assessment/ Plan:   1. Hypertensive emergency, improved. Renal artery duplex negative for RAS, aldo/renin in the past have been okay. Continue with amlodipine 10mg  daily, coreg 12.5mg  bid, hydralazine 50mg  tid. Sodium restriction in diet  Added chlorthalidone 12.5mg  daily, can uptitrate this to 25mg  daily 2. Acute kidney injury. Unsure at this time if she actually has CKD (will need data with time to determine this). 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 No need for a biopsy as of right now especially given that she is hypertensive and management as of right now will not change much but may need one down the road. Diffuse echogencity on ultrasound, no overt abnormalities, neg RAS. Kidney function relatively stable/slightly better today. 3. Proteinuria (upc 0.9g, uacr 693). FLC pending, neg SPEP in the past. UPC only 0.9g, HIV, Hep panel negative. 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 Hba1c 5.0% 4. Anemia, hgb stable today. ldh high, hapto low. recommend smear review 5. Thrombocytopenia. TMA picture? Platelet count improving with BP control 6. Hyperglycemia, consider evaluating her for diabetes 7. Hypokalemia, resolved. Stopping KCl supplements 8. Obesity: recommend outpatient sleep study  Subjective:   No acute events, no complaints.   Objective:   BP (!) 149/92 (BP Location: Right Arm)   Pulse 79   Temp 97.9 F (36.6 C) (Oral)   Resp 19   Ht 4\' 11"  (1.499 m)   Wt 87.1 kg   LMP 12/23/2013   SpO2 95%   BMI 38.80 kg/m   Intake/Output Summary (Last 24 hours) at 07/22/2020 1156 Last data filed at 07/22/2020 1140 Gross per 24 hour  Intake 1377 ml  Output 200 ml  Net 1177 ml   Weight change: 0.036 kg  Physical Exam: Gen: nad, comfortable appearing CVS:s1s2, rrr, no  m/r/g Resp:cta bl, no w/r/r/c, unlabored, bl chest expansion NIO:EVOJJ, soft, nt/nd Ext:no edema Skin: no rash Neuro: nonfocal  Imaging: VAS US RENAL ARTERY DUPLEX  Result Date: 07/21/2020 ABDOMINAL VISCERAL Indications: Resistant hypertension Comparison Study: No prior study Performing Technologist: Maudry Mayhew MHA, RDMS, RVT, RDCS  Examination Guidelines: A complete evaluation includes B-mode imaging, spectral Doppler, color Doppler, and power Doppler as needed of all accessible portions of each vessel. Bilateral testing is considered an integral part of a complete examination. Limited examinations for reoccurring indications may be performed as noted.  Duplex Findings: +--------------------+--------+--------+------+--------+ Mesenteric          PSV cm/sEDV cm/sPlaqueComments +--------------------+--------+--------+------+--------+ Aorta Prox            111      13                  +--------------------+--------+--------+------+--------+ Celiac Artery Origin  100                          +--------------------+--------+--------+------+--------+ SMA Proximal          157      15                  +--------------------+--------+--------+------+--------+    +------------------+--------+--------+-------+ Right Renal ArteryPSV cm/sEDV cm/sComment +------------------+--------+--------+-------+ Origin               60      8            +------------------+--------+--------+-------+ Proximal  36      7            +------------------+--------+--------+-------+ Mid                  16      6            +------------------+--------+--------+-------+ Distal               17      6            +------------------+--------+--------+-------+ +-----------------+--------+--------+-------+ Left Renal ArteryPSV cm/sEDV cm/sComment +-----------------+--------+--------+-------+ Origin             121      19            +-----------------+--------+--------+-------+ Proximal            30      8            +-----------------+--------+--------+-------+ Mid                 28      8            +-----------------+--------+--------+-------+ Distal              37      14           +-----------------+--------+--------+-------+ +------------+--------+--------+----+-----------+--------+--------+----+ Right KidneyPSV cm/sEDV cm/sRI  Left KidneyPSV cm/sEDV cm/sRI   +------------+--------+--------+----+-----------+--------+--------+----+ Upper Pole  14      5       0.67Upper Pole 12      3       0.73 +------------+--------+--------+----+-----------+--------+--------+----+ Mid         10      4       0.61Mid        14      4       0.72 +------------+--------+--------+----+-----------+--------+--------+----+ Lower Pole  13      4       0.66Lower Pole 12      4       0.70 +------------+--------+--------+----+-----------+--------+--------+----+ Hilar       14      5       0.67Hilar      19      5       0.74 +------------+--------+--------+----+-----------+--------+--------+----+ +------------------+---------+------------------+---------+ Right Kidney               Left Kidney                 +------------------+---------+------------------+---------+ RAR                        RAR                         +------------------+---------+------------------+---------+ RAR (manual)      0.54     RAR (manual)      1.09      +------------------+---------+------------------+---------+ Cortex            15/4 cm/sCortex            16/5 cm/s +------------------+---------+------------------+---------+ Cortex thickness           Corex thickness             +------------------+---------+------------------+---------+ Kidney length (cm)9.70     Kidney length (cm)9.53      +------------------+---------+------------------+---------+  Summary: Renal:  Right: No evidence of  right renal artery stenosis. RRV flow present. Left:  No evidence of left renal artery  stenosis. LRV flow present.  Diffusely increased echogenicity within bilateral renal parenchyma; consistent with medical renal disease. Correlates with previous renal ultrasound 06/13/2020.  *See table(s) above for measurements and observations.  Diagnosing physician: Curt Jews MD  Electronically signed by Curt Jews MD on 07/21/2020 at 4:26:37 PM.    Final     Labs: BMET Recent Labs  Lab 07/19/20 1750 07/19/20 2139 07/20/20 0318 07/21/20 0235 07/22/20 0354  NA 138  --  136 138 140  K 3.2*  --  3.5 4.0 3.9  CL 101  --  101 105 107  CO2 24  --  23 25 23   GLUCOSE 97  --  153* 119* 104*  BUN 40*  --  47* 45* 42*  CREATININE 3.77* 3.98* 3.98* 3.79* 3.58*  CALCIUM 9.8  --  9.4 9.3 9.5  PHOS  --   --   --  3.3 3.0   CBC Recent Labs  Lab 07/19/20 2139 07/20/20 0318 07/21/20 0235 07/22/20 0354  WBC 10.6* 9.4 7.2 7.5  HGB 10.3* 9.5* 8.6* 8.7*  HCT 32.0* 30.1* 27.4* 28.1*  MCV 88.6 89.9 89.3 90.6  PLT 88* 84* 102* 129*    Medications:    . amLODipine  10 mg Oral Daily  . carvedilol  12.5 mg Oral BID WC  . hydrALAZINE  50 mg Oral TID      Gean Quint, MD [facility] 07/22/2020, 11:56 AM

## 2020-07-22 NOTE — TOC Transition Note (Signed)
Transition of Care [facility]) - CM/SW Discharge Note   Patient Details  Name: Robin Navarro MRN: [id number] Date of Birth: 1965/12/02  Transition of Care [facility]) CM/SW Contact:  Zenon Mayo, RN Phone Number: 07/22/2020, 12:38 PM   Clinical Narrative:    Patient is for dc today, NCM is assisting her with Medications thru [facility] and MD will send meds to [place].  Patient states she has transportation at dc.  She can afford to pay 3.00 each for meds.   Final next level of care: Home/Self Care Barriers to Discharge: No Barriers Identified   Patient Goals and CMS Choice Patient states their goals for this hospitalization and ongoing recovery are:: get better   Choice offered to / list presented to : NA  Discharge Placement                       Discharge Plan and Services                  DME Agency: NA       HH Arranged: NA          Social Determinants of Health (SDOH) Interventions     Readmission Risk Interventions No flowsheet data found.

## 2020-07-22 NOTE — Progress Notes (Signed)
D/C instructions given and reviewed. Tele and IV removed. Tolerated well.  

## 2020-07-22 NOTE — Discharge Summary (Signed)
Physician Discharge Summary  Robin Navarro:[id number] DOB: [birth year] DOA: 07/19/2020  PCP: Robin Navarro  Admit date: 07/19/2020 Discharge date: 07/22/2020  Admitted From: Home.  Disposition: Home.   Recommendations for Outpatient Follow-up:  1. Follow up with PCP in 1-2 weeks 2. Please obtain BMP/CBC in one week Please follow up with nephrology as recommended.  Recommend outpatient follow up with ophthalmology.  Recommend outpatient sleep study for evaluation of Sleep apnea.    Discharge Condition:stable.  CODE STATUS: FULL CODE.  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  Nephrology consulted and recommendations given.   Discharge Diagnoses:  Principal Problem:   Hypertensive emergency Active Problems:   AKI (acute kidney injury) ([place])   Normocytic anemia   Hypokalemia   Elevated troponin   Abnormal EKG   Visual disturbance  Hypertensive emergency: BP 247/170 on admit.  Likely due to noncompliance with medication and dietary indiscretion.  Need to exclude secondary causes.  Work-up in 06/2020 with normal TSH, aldosterone/renin, no M spike on UPEP and no significant proteinuria.  Renal US consistent with CKD. Renal duplex without renal artery stenosis. Echo basically normal. Denies NSAID use.  Blood pressure improved. -Needs outpatient sleep study--high risk for OSA -Continue amlodipine 10 mg daily, Coreg 12.5 mg twice daily -Increase hydralazine to 50 mg 3 times daily -Appreciate guidance by nephrology   AKI on CKD-4 with mild azotemia: 2.65 on discharge last month >Cr 3.77 this admission> 3.98> 3.79. 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 She was on lisinopril/HCTZ prior to previous admission that was discontinued on discharge but not sure if she is still taking or not. Nephropathy/GN work-up unrevealing so far. -Appreciate help by nephrology  Hypokalemia: Not sure if she is still taking it lisinopril/HCTZ.  Her aldosterone/renin ratio was within normal last month. Hypokalemia resolved.  Mildly elevated troponin: Likely demand ischemia from uncontrolled hypertension. Echocardiogram reviewed. Discussed the EKG with the patient and recommend outpatient follow upw ith cardiology.   Anemia of renal disease: Recent baseline Hgb 9-10> 10.6> 9.5> 8.6. Denies melena or hematochezia. Normal anemia panel last month. LDH elevated. She has thrombocytopenia.   Thrombocytopenia:platelets improving.   Papilledema/visual disturbance/scotoma in left eye: Not acute.  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 Improved. Vision grossly intact in all visual fields today. -Optimize blood pressure control -Needs ophthalmology evaluation outpatient.  Class II obesity Body mass index is 38.84 kg/m.  -Encourage lifestyle change to lose weight.    Discharge Instructions  Discharge Instructions    Ambulatory referral to Cardiology   Complete by: As directed    Diet - low sodium heart healthy   Complete by: As directed    Discharge instructions   Complete by: As directed    Please follow up with Nephrology as recommended.   Increase activity slowly   Complete by: As directed    Increase activity slowly   Complete by: As directed      Allergies as of 07/22/2020   No Known Allergies     Medication List    STOP taking these medications   albuterol 108 (90 Base) MCG/ACT inhaler Commonly known as: VENTOLIN HFA   labetalol 200 MG tablet Commonly known as: NORMODYNE  TAKE these medications   acetaminophen 500 MG tablet Commonly known as: TYLENOL Take 500 mg by mouth as needed for mild  pain or moderate pain.   amLODipine 10 MG tablet Commonly known as: NORVASC Take 1 tablet (10 mg total) by mouth daily.   carvedilol 12.5 MG tablet Commonly known as: COREG Take 1 tablet (12.5 mg total) by mouth 2 (two) times daily with a meal.   chlorthalidone 25 MG tablet Commonly known as: HYGROTON Take 1 tablet (25 mg total) by mouth daily.   hydrALAZINE 50 MG tablet Commonly known as: APRESOLINE Take 1 tablet (50 mg total) by mouth 3 (three) times daily. What changed:   medication strength  how much to take   senna-docusate 8.6-50 MG tablet Commonly known as: Senokot-S Take 1 tablet by mouth at bedtime as needed for mild constipation.       Follow-up Information    Robin Navarro. Schedule an appointment as soon as possible for a visit in 1 week(s).   Specialty: Family Medicine Contact information: [place] [address] [phone]              No Known Allergies  Consultations:  Nephrology.    Procedures/Studies: ECHOCARDIOGRAM COMPLETE  Result Date: 07/20/2020    ECHOCARDIOGRAM REPORT   Patient Name:   Robin Navarro Date of Exam: 07/20/2020 Medical Rec #:  [id number]          Height:       59.0 in Accession #:    [id number]         Weight:       192.0 lb Date of Birth:  1966-08-03           BSA:          1.813 m Patient Age:    54 years           BP:           153/101 mmHg Patient Gender: F                  HR:           95 bpm. Exam Location:  Inpatient Procedure: 2D Echo Indications:    abnormal ecg 794.31  History:        Patient has no prior history of Echocardiogram examinations.                 Signs/Symptoms:elevated troponin; Risk Factors:Hypertension.  Sonographer:    Johny Chess Referring Phys: [id number] [place]  1. Left ventricular ejection fraction, by estimation, is 60 to 65%. The left ventricle has normal function. The left ventricle has no regional wall motion abnormalities.  There is moderate left ventricular hypertrophy. Left ventricular diastolic parameters were normal.  2. Right ventricular systolic function is normal. The right ventricular size is normal. Tricuspid regurgitation signal is inadequate for assessing PA pressure.  3. The mitral valve is normal in structure. No evidence of mitral valve regurgitation. No evidence of mitral stenosis.  4. The aortic valve is tricuspid. Aortic valve regurgitation is not visualized. No aortic stenosis is present.  5. The inferior vena cava is normal in size with greater than 50% respiratory variability, suggesting right atrial pressure of 3 mmHg. FINDINGS  Left Ventricle: Left ventricular ejection fraction, by estimation, is 60 to 65%. The left ventricle has normal function. The left ventricle has no regional wall motion abnormalities. The left ventricular internal cavity size was normal in  size. There is  moderate left ventricular hypertrophy. Left ventricular diastolic parameters were normal. Right Ventricle: The right ventricular size is normal.Right ventricular systolic function is normal. Tricuspid regurgitation signal is inadequate for assessing PA pressure. The tricuspid regurgitant velocity is 2.24 m/s, and with an assumed right atrial pressure of 3 mmHg, the estimated right ventricular systolic pressure is 19.7 mmHg. Left Atrium: Left atrial size was normal in size. Right Atrium: Right atrial size was normal in size. Pericardium: There is no evidence of pericardial effusion. Mitral Valve: The mitral valve is normal in structure. No evidence of mitral valve regurgitation. No evidence of mitral valve stenosis. Tricuspid Valve: The tricuspid valve is normal in structure. Tricuspid valve regurgitation is trivial. No evidence of tricuspid stenosis. Aortic Valve: The aortic valve is tricuspid. Aortic valve regurgitation is not visualized. No aortic stenosis is present. Pulmonic Valve: The pulmonic valve was normal in structure. Pulmonic  valve regurgitation is not visualized. No evidence of pulmonic stenosis. Aorta: The aortic root is normal in size and structure. Venous: The inferior vena cava is normal in size with greater than 50% respiratory variability, suggesting right atrial pressure of 3 mmHg. IAS/Shunts: No atrial level shunt detected by color flow Doppler.  LEFT VENTRICLE PLAX 2D LVIDd:         4.80 cm  Diastology LVIDs:         3.60 cm  LV e' medial:   10.40 cm/s LV PW:         1.40 cm  LV E/e' medial: 8.4 LV IVS:        1.20 cm LVOT diam:     1.60 cm LV SV:         53 LV SV Index:   29 LVOT Area:     2.01 cm  RIGHT VENTRICLE             IVC RV S prime:     15.10 cm/s  IVC diam: 1.10 cm TAPSE (M-mode): 3.0 cm LEFT ATRIUM             Index       RIGHT ATRIUM           Index LA diam:        4.40 cm 2.43 cm/m  RA Area:     11.00 cm LA Vol (A2C):   72.4 ml 39.94 ml/m RA Volume:   24.00 ml  13.24 ml/m LA Vol (A4C):   50.2 ml 27.69 ml/m LA Biplane Vol: 62.4 ml 34.42 ml/m  AORTIC VALVE LVOT Vmax:   157.00 cm/s LVOT Vmean:  107.000 cm/s LVOT VTI:    0.265 m  AORTA Ao Root diam: 2.70 cm Ao Asc diam:  2.70 cm MV E velocity: 86.90 cm/s   TRICUSPID VALVE MV A velocity: 138.00 cm/s  TR Peak grad:   20.1 mmHg MV E/A ratio:  0.63         TR Vmax:        224.00 cm/s                              SHUNTS                             Systemic VTI:  0.26 m  Systemic Diam: 1.60 cm Kirk Ruths Navarro Electronically signed by Kirk Ruths Navarro Signature Date/Time: 07/20/2020/12:02:14 PM    Final    VAS US RENAL ARTERY DUPLEX  Result Date: 07/21/2020 ABDOMINAL VISCERAL Indications: Resistant hypertension Comparison Study: No prior study Performing Technologist: Maudry Mayhew MHA, RDMS, RVT, RDCS  Examination Guidelines: A complete evaluation includes B-mode imaging, spectral Doppler, color Doppler, and power Doppler as needed of all accessible portions of each vessel. Bilateral testing is considered an integral part of a  complete examination. Limited examinations for reoccurring indications may be performed as noted.  Duplex Findings: +--------------------+--------+--------+------+--------+ Mesenteric          PSV cm/sEDV cm/sPlaqueComments +--------------------+--------+--------+------+--------+ Aorta Prox            111      13                  +--------------------+--------+--------+------+--------+ Celiac Artery Origin  100                          +--------------------+--------+--------+------+--------+ SMA Proximal          157      15                  +--------------------+--------+--------+------+--------+    +------------------+--------+--------+-------+ Right Renal ArteryPSV cm/sEDV cm/sComment +------------------+--------+--------+-------+ Origin               60      8            +------------------+--------+--------+-------+ Proximal             36      7            +------------------+--------+--------+-------+ Mid                  16      6            +------------------+--------+--------+-------+ Distal               17      6            +------------------+--------+--------+-------+ +-----------------+--------+--------+-------+ Left Renal ArteryPSV cm/sEDV cm/sComment +-----------------+--------+--------+-------+ Origin             121      19           +-----------------+--------+--------+-------+ Proximal            30      8            +-----------------+--------+--------+-------+ Mid                 28      8            +-----------------+--------+--------+-------+ Distal              37      14           +-----------------+--------+--------+-------+ +------------+--------+--------+----+-----------+--------+--------+----+ Right KidneyPSV cm/sEDV cm/sRI  Left KidneyPSV cm/sEDV cm/sRI   +------------+--------+--------+----+-----------+--------+--------+----+ Upper Pole  14      5       0.67Upper Pole 12      3       0.73  +------------+--------+--------+----+-----------+--------+--------+----+ Mid         10      4       0.61Mid        14      4       0.72 +------------+--------+--------+----+-----------+--------+--------+----+ Lower Pole  13      4       0.66Lower Pole 12      4       0.70 +------------+--------+--------+----+-----------+--------+--------+----+ Hilar       14      5       0.67Hilar      19      5       0.74 +------------+--------+--------+----+-----------+--------+--------+----+ +------------------+---------+------------------+---------+ Right Kidney               Left Kidney                 +------------------+---------+------------------+---------+ RAR                        RAR                         +------------------+---------+------------------+---------+ RAR (manual)      0.54     RAR (manual)      1.09      +------------------+---------+------------------+---------+ Cortex            15/4 cm/sCortex            16/5 cm/s +------------------+---------+------------------+---------+ Cortex thickness           Corex thickness             +------------------+---------+------------------+---------+ Kidney length (cm)9.70     Kidney length (cm)9.53      +------------------+---------+------------------+---------+  Summary: Renal:  Right: No evidence of right renal artery stenosis. RRV flow present. Left:  No evidence of left renal artery stenosis. LRV flow present.  Diffusely increased echogenicity within bilateral renal parenchyma; consistent with medical renal disease. Correlates with previous renal ultrasound 06/13/2020.  *See table(s) above for measurements and observations.  Diagnosing physician: Curt Jews Navarro  Electronically signed by Curt Jews Navarro on 07/21/2020 at 4:26:37 PM.    Final        Subjective: No new complaints.   Discharge Exam: Vitals:   07/22/20 0358 07/22/20 1134  BP: (!) 191/113 (!) 149/92  Pulse: 79 79  Resp: 16 19  Temp:  98.6 F (37 C) 97.9 F (36.6 C)  SpO2: 100% 95%   Vitals:   07/21/20 1719 07/21/20 2023 07/22/20 0358 07/22/20 1134  BP: (!) 160/100 (!) 158/97 (!) 191/113 (!) 149/92  Pulse: 80 77 79 79  Resp:  18 16 19   Temp:  99.1 F (37.3 C) 98.6 F (37 C) 97.9 F (36.6 C)  TempSrc:  Oral Oral Oral  SpO2:  97% 100% 95%  Weight:   87.1 kg   Height:        General: Pt is alert, awake, not in acute distress Cardiovascular: RRR, S1/S2 +, no rubs, no gallops Respiratory: CTA bilaterally, no wheezing, no rhonchi Abdominal: Soft, NT, ND, bowel sounds + Extremities: no edema, no cyanosis    The results of significant diagnostics from this hospitalization (including imaging, microbiology, ancillary and laboratory) are listed below for reference.     Microbiology: Recent Results (from the past 240 hour(s))  Respiratory Panel by RT PCR (Flu A&B, Covid) - Nasopharyngeal Swab     Status: None   Collection Time: 07/19/20  9:25 PM   Specimen: Nasopharyngeal Swab  Result Value Ref Range Status   SARS Coronavirus 2 by RT PCR NEGATIVE NEGATIVE Final    Comment: (NOTE) SARS-CoV-2 target nucleic acids are NOT DETECTED.  The SARS-CoV-2 RNA is generally detectable in upper respiratoy specimens  during the acute phase of infection. The lowest concentration of SARS-CoV-2 viral copies this assay can detect is 131 copies/mL. A negative result does not preclude SARS-Cov-2 infection and should not be used as the sole basis for treatment or other patient management decisions. A negative result may occur with  improper specimen collection/handling, submission of specimen other than nasopharyngeal swab, presence of viral mutation(s) within the areas targeted by this assay, and inadequate number of viral copies (<131 copies/mL). A negative result must be combined with clinical observations, patient history, and epidemiological information. The expected result is Negative.  Fact Sheet for Patients:   PinkCheek.be  Fact Sheet for Healthcare Providers:  GravelBags.it  This test is no t yet approved or cleared by the [place] FDA and  has been authorized for detection and/or diagnosis of SARS-CoV-2 by FDA under an Emergency Use Authorization (EUA). This EUA will remain  in effect (meaning this test can be used) for the duration of the COVID-19 declaration under Section 564(b)(1) of the Act, 21 U.S.C. section 360bbb-3(b)(1), unless the authorization is terminated or revoked sooner.     Influenza A by PCR NEGATIVE NEGATIVE Final   Influenza B by PCR NEGATIVE NEGATIVE Final    Comment: (NOTE) The Xpert Xpress SARS-CoV-2/FLU/RSV assay is intended as an aid in  the diagnosis of influenza from Nasopharyngeal swab specimens and  should not be used as a sole basis for treatment. Nasal washings and  aspirates are unacceptable for Xpert Xpress SARS-CoV-2/FLU/RSV  testing.  Fact Sheet for Patients: PinkCheek.be  Fact Sheet for Healthcare Providers: GravelBags.it  This test is not yet approved or cleared by the [place] FDA and  has been authorized for detection and/or diagnosis of SARS-CoV-2 by  FDA under an Emergency Use Authorization (EUA). This EUA will remain  in effect (meaning this test can be used) for the duration of the  Covid-19 declaration under Section 564(b)(1) of the Act, 21  U.S.C. section 360bbb-3(b)(1), unless the authorization is  terminated or revoked. Performed at [facility], [place] [address], [place], [place] [address]      Labs: BNP (last 3 results) No results for input(s): BNP in the last 8760 hours. Basic Metabolic Panel: Recent Labs  Lab 07/19/20 1750 07/19/20 2032 07/19/20 2139 07/20/20 0318 07/21/20 0235 07/22/20 0354  NA 138  --   --  136 138 140  K 3.2*  --   --  3.5 4.0 3.9  CL 101  --   --  101 105 107  CO2 24   --   --  23 25 23   GLUCOSE 97  --   --  153* 119* 104*  BUN 40*  --   --  47* 45* 42*  CREATININE 3.77*  --  3.98* 3.98* 3.79* 3.58*  CALCIUM 9.8  --   --  9.4 9.3 9.5  MG  --  2.2  --   --  2.0 1.9  PHOS  --   --   --   --  3.3 3.0   Liver Function Tests: Recent Labs  Lab 07/21/20 0235 07/22/20 0354  ALBUMIN 3.0* 3.1*   No results for input(s): LIPASE, AMYLASE in the last 168 hours. No results for input(s): AMMONIA in the last 168 hours. CBC: Recent Labs  Lab 07/19/20 1750 07/19/20 2139 07/20/20 0318 07/21/20 0235 07/22/20 0354  WBC 9.1 10.6* 9.4 7.2 7.5  HGB 10.6* 10.3* 9.5* 8.6* 8.7*  HCT 33.5* 32.0* 30.1* 27.4* 28.1*  MCV 88.9  88.6 89.9 89.3 90.6  PLT 88* 88* 84* 102* 129*   Cardiac Enzymes: No results for input(s): CKTOTAL, CKMB, CKMBINDEX, TROPONINI in the last 168 hours. BNP: Invalid input(s): POCBNP CBG: No results for input(s): GLUCAP in the last 168 hours. D-Dimer No results for input(s): DDIMER in the last 72 hours. Hgb A1c Recent Labs    07/21/20 0235  HGBA1C 5.0   Lipid Profile Recent Labs    07/21/20 0235  CHOL 140  HDL 40*  LDLCALC 77  TRIG 115  CHOLHDL 3.5   Thyroid function studies No results for input(s): TSH, T4TOTAL, T3FREE, THYROIDAB in the last 72 hours.  Invalid input(s): FREET3 Anemia work up No results for input(s): VITAMINB12, FOLATE, FERRITIN, TIBC, IRON, RETICCTPCT in the last 72 hours. Urinalysis    Component Value Date/Time   COLORURINE YELLOW 07/20/2020 1554   APPEARANCEUR HAZY (A) 07/20/2020 1554   LABSPEC 1.011 07/20/2020 1554   PHURINE 5.0 07/20/2020 1554   GLUCOSEU NEGATIVE 07/20/2020 1554   HGBUR NEGATIVE 07/20/2020 [place] 07/20/2020 1554   KETONESUR NEGATIVE 07/20/2020 1554   PROTEINUR 100 (A) 07/20/2020 1554   UROBILINOGEN 0.2 01/06/2014 1848   NITRITE NEGATIVE 07/20/2020 1554   LEUKOCYTESUR NEGATIVE 07/20/2020 1554   Sepsis Labs Invalid input(s): PROCALCITONIN,  WBC,   LACTICIDVEN Microbiology Recent Results (from the past 240 hour(s))  Respiratory Panel by RT PCR (Flu A&B, Covid) - Nasopharyngeal Swab     Status: None   Collection Time: 07/19/20  9:25 PM   Specimen: Nasopharyngeal Swab  Result Value Ref Range Status   SARS Coronavirus 2 by RT PCR NEGATIVE NEGATIVE Final    Comment: (NOTE) SARS-CoV-2 target nucleic acids are NOT DETECTED.  The SARS-CoV-2 RNA is generally detectable in upper respiratoy specimens during the acute phase of infection. The lowest concentration of SARS-CoV-2 viral copies this assay can detect is 131 copies/mL. A negative result does not preclude SARS-Cov-2 infection and should not be used as the sole basis for treatment or other patient management decisions. A negative result may occur with  improper specimen collection/handling, submission of specimen other than nasopharyngeal swab, presence of viral mutation(s) within the areas targeted by this assay, and inadequate number of viral copies (<131 copies/mL). A negative result must be combined with clinical observations, patient history, and epidemiological information. The expected result is Negative.  Fact Sheet for Patients:  PinkCheek.be  Fact Sheet for Healthcare Providers:  GravelBags.it  This test is no t yet approved or cleared by the [place] FDA and  has been authorized for detection and/or diagnosis of SARS-CoV-2 by FDA under an Emergency Use Authorization (EUA). This EUA will remain  in effect (meaning this test can be used) for the duration of the COVID-19 declaration under Section 564(b)(1) of the Act, 21 U.S.C. section 360bbb-3(b)(1), unless the authorization is terminated or revoked sooner.     Influenza A by PCR NEGATIVE NEGATIVE Final   Influenza B by PCR NEGATIVE NEGATIVE Final    Comment: (NOTE) The Xpert Xpress SARS-CoV-2/FLU/RSV assay is intended as an aid in  the diagnosis of  influenza from Nasopharyngeal swab specimens and  should not be used as a sole basis for treatment. Nasal washings and  aspirates are unacceptable for Xpert Xpress SARS-CoV-2/FLU/RSV  testing.  Fact Sheet for Patients: PinkCheek.be  Fact Sheet for Healthcare Providers: GravelBags.it  This test is not yet approved or cleared by the [place] FDA and  has been authorized for detection and/or diagnosis of SARS-CoV-2 by  FDA under an Emergency Use Authorization (EUA). This EUA will remain  in effect (meaning this test can be used) for the duration of the  Covid-19 declaration under Section 564(b)(1) of the Act, 21  U.S.C. section 360bbb-3(b)(1), unless the authorization is  terminated or revoked. Performed at [facility], [place] [address], [place],  [address]      Time coordinating discharge: 32 minutes.   SIGNED:   Hosie Poisson, Navarro  [facility] 07/22/2020, 5:10 PM

## 2020-07-22 NOTE — Plan of Care (Signed)

## 2021-01-22 ENCOUNTER — Emergency Department (HOSPITAL_COMMUNITY): Payer: Medicaid Other

## 2021-01-22 ENCOUNTER — Inpatient Hospital Stay (HOSPITAL_COMMUNITY)
Admission: EM | Admit: 2021-01-22 | Discharge: 2021-01-27 | DRG: 065 | Disposition: A | Discharge status: Home / Self Care | Payer: Medicaid Other | Attending: Neurology | Admitting: Neurology

## 2021-01-22 ENCOUNTER — Inpatient Hospital Stay (HOSPITAL_COMMUNITY): Payer: Medicaid Other

## 2021-01-22 DIAGNOSIS — I619 Nontraumatic intracerebral hemorrhage, unspecified: Secondary | ICD-10-CM | POA: Diagnosis present

## 2021-01-22 DIAGNOSIS — R471 Dysarthria and anarthria: Secondary | ICD-10-CM | POA: Diagnosis present

## 2021-01-22 DIAGNOSIS — F17210 Nicotine dependence, cigarettes, uncomplicated: Secondary | ICD-10-CM | POA: Diagnosis present

## 2021-01-22 DIAGNOSIS — I629 Nontraumatic intracranial hemorrhage, unspecified: Secondary | ICD-10-CM

## 2021-01-22 DIAGNOSIS — I613 Nontraumatic intracerebral hemorrhage in brain stem: Secondary | ICD-10-CM | POA: Diagnosis present

## 2021-01-22 DIAGNOSIS — R29706 NIHSS score 6: Secondary | ICD-10-CM | POA: Diagnosis present

## 2021-01-22 DIAGNOSIS — E785 Hyperlipidemia, unspecified: Secondary | ICD-10-CM | POA: Diagnosis present

## 2021-01-22 DIAGNOSIS — Z6834 Body mass index (BMI) 34.0-34.9, adult: Secondary | ICD-10-CM

## 2021-01-22 DIAGNOSIS — D638 Anemia in other chronic diseases classified elsewhere: Secondary | ICD-10-CM | POA: Diagnosis present

## 2021-01-22 DIAGNOSIS — Z79899 Other long term (current) drug therapy: Secondary | ICD-10-CM | POA: Diagnosis not present

## 2021-01-22 DIAGNOSIS — I1310 Hypertensive heart and chronic kidney disease without heart failure, with stage 1 through stage 4 chronic kidney disease, or unspecified chronic kidney disease: Secondary | ICD-10-CM | POA: Diagnosis present

## 2021-01-22 DIAGNOSIS — E669 Obesity, unspecified: Secondary | ICD-10-CM | POA: Diagnosis present

## 2021-01-22 DIAGNOSIS — R001 Bradycardia, unspecified: Secondary | ICD-10-CM | POA: Diagnosis present

## 2021-01-22 DIAGNOSIS — D649 Anemia, unspecified: Secondary | ICD-10-CM | POA: Diagnosis present

## 2021-01-22 DIAGNOSIS — I161 Hypertensive emergency: Secondary | ICD-10-CM | POA: Diagnosis present

## 2021-01-22 DIAGNOSIS — Z8249 Family history of ischemic heart disease and other diseases of the circulatory system: Secondary | ICD-10-CM

## 2021-01-22 DIAGNOSIS — N184 Chronic kidney disease, stage 4 (severe): Secondary | ICD-10-CM | POA: Diagnosis present

## 2021-01-22 DIAGNOSIS — Z9114 Patient's other noncompliance with medication regimen: Secondary | ICD-10-CM

## 2021-01-22 DIAGNOSIS — Z20822 Contact with and (suspected) exposure to covid-19: Secondary | ICD-10-CM | POA: Diagnosis present

## 2021-01-22 LAB — TYPE AND SCREEN
ABO/RH(D): O POS
Antibody Screen: NEGATIVE

## 2021-01-22 LAB — I-STAT CHEM 8, ED
BUN: 57 mg/dL — ABNORMAL HIGH (ref 6–20)
Calcium, Ion: 1.21 mmol/L (ref 1.15–1.40)
Chloride: 105 mmol/L (ref 98–111)
Creatinine, Ser: 3.6 mg/dL — ABNORMAL HIGH (ref 0.44–1.00)
Glucose, Bld: 126 mg/dL — ABNORMAL HIGH (ref 70–99)
HCT: 33 % — ABNORMAL LOW (ref 36.0–46.0)
Hemoglobin: 11.2 g/dL — ABNORMAL LOW (ref 12.0–15.0)
Potassium: 3.6 mmol/L (ref 3.5–5.1)
Sodium: 141 mmol/L (ref 135–145)
TCO2: 29 mmol/L (ref 22–32)

## 2021-01-22 LAB — LIPID PANEL
Cholesterol: 175 mg/dL (ref 0–200)
HDL: 48 mg/dL (ref >40)
LDL Cholesterol: 69 mg/dL (ref 0–99)
Total CHOL/HDL Ratio: 3.6 RATIO
Triglycerides: 288 mg/dL — ABNORMAL HIGH (ref <150)
VLDL: 58 mg/dL — ABNORMAL HIGH (ref 0–40)

## 2021-01-22 LAB — COMPREHENSIVE METABOLIC PANEL
ALT: 16 U/L (ref 0–44)
AST: 30 U/L (ref 15–41)
Albumin: 3.8 g/dL (ref 3.5–5.0)
Alkaline Phosphatase: 77 U/L (ref 38–126)
Anion gap: 10 (ref 5–15)
BUN: 46 mg/dL — ABNORMAL HIGH (ref 6–20)
CO2: 25 mmol/L (ref 22–32)
Calcium: 9.6 mg/dL (ref 8.9–10.3)
Chloride: 103 mmol/L (ref 98–111)
Creatinine, Ser: 3.52 mg/dL — ABNORMAL HIGH (ref 0.44–1.00)
GFR, Estimated: 15 mL/min — ABNORMAL LOW (ref >60)
Glucose, Bld: 121 mg/dL — ABNORMAL HIGH (ref 70–99)
Potassium: 3.5 mmol/L (ref 3.5–5.1)
Sodium: 138 mmol/L (ref 135–145)
Total Bilirubin: 0.7 mg/dL (ref 0.3–1.2)
Total Protein: 7.6 g/dL (ref 6.5–8.1)

## 2021-01-22 LAB — CBC
HCT: 32.6 % — ABNORMAL LOW (ref 36.0–46.0)
Hemoglobin: 10.4 g/dL — ABNORMAL LOW (ref 12.0–15.0)
MCH: 28 pg (ref 26.0–34.0)
MCHC: 31.9 g/dL (ref 30.0–36.0)
MCV: 87.9 fL (ref 80.0–100.0)
Platelets: 174 10*3/uL (ref 150–400)
RBC: 3.71 MIL/uL — ABNORMAL LOW (ref 3.87–5.11)
RDW: 17.5 % — ABNORMAL HIGH (ref 11.5–15.5)
WBC: 7.8 10*3/uL (ref 4.0–10.5)
nRBC: 0 % (ref 0.0–0.2)

## 2021-01-22 LAB — DIFFERENTIAL
Abs Immature Granulocytes: 0.06 10*3/uL (ref 0.00–0.07)
Basophils Absolute: 0 10*3/uL (ref 0.0–0.1)
Basophils Relative: 0 %
Eosinophils Absolute: 0.1 10*3/uL (ref 0.0–0.5)
Eosinophils Relative: 1 %
Immature Granulocytes: 1 %
Lymphocytes Relative: 27 %
Lymphs Abs: 2.1 10*3/uL (ref 0.7–4.0)
Monocytes Absolute: 0.4 10*3/uL (ref 0.1–1.0)
Monocytes Relative: 5 %
Neutro Abs: 5.1 10*3/uL (ref 1.7–7.7)
Neutrophils Relative %: 66 %

## 2021-01-22 LAB — PROTIME-INR
INR: 1 (ref 0.8–1.2)
Prothrombin Time: 13.6 seconds (ref 11.4–15.2)

## 2021-01-22 LAB — APTT: aPTT: 31 seconds (ref 24–36)

## 2021-01-22 LAB — RESP PANEL BY RT-PCR (FLU A&B, COVID) ARPGX2
Influenza A by PCR: NEGATIVE
Influenza B by PCR: NEGATIVE
SARS Coronavirus 2 by RT PCR: NEGATIVE

## 2021-01-22 LAB — MRSA PCR SCREENING: MRSA by PCR: NEGATIVE

## 2021-01-22 LAB — I-STAT BETA HCG BLOOD, ED (MC, WL, AP ONLY): I-stat hCG, quantitative: 6.2 m[IU]/mL — ABNORMAL HIGH (ref <5)

## 2021-01-22 LAB — CBG MONITORING, ED: Glucose-Capillary: 125 mg/dL — ABNORMAL HIGH (ref 70–99)

## 2021-01-22 LAB — HEMOGLOBIN A1C
Hgb A1c MFr Bld: 5.3 % (ref 4.8–5.6)
Mean Plasma Glucose: 105.41 mg/dL

## 2021-01-22 LAB — ABO/RH: ABO/RH(D): O POS

## 2021-01-22 LAB — ETHANOL: Alcohol, Ethyl (B): <10 mg/dL (ref <10)

## 2021-01-22 IMAGING — MR MR MRA NECK W/O CM
1 series · 20 of 48 positions shown · non-contrast
Comparison: None.

CLINICAL DATA: Stroke follow-up.

EXAM:
MRA NECK WITHOUT CONTRAST
TECHNIQUE: Angiographic images of the neck were obtained using MRA technique
without intravenous contrast. Carotid stenosis measurements (when
applicable) are obtained utilizing NASCET criteria, using the distal
internal carotid diameter as the denominator.

[Series 4: ax (id) · axial · 1.0mm · 0.43mm/px · z∈[-49,+28]mm · 20 of 176 slices shown]
[im 1/176]
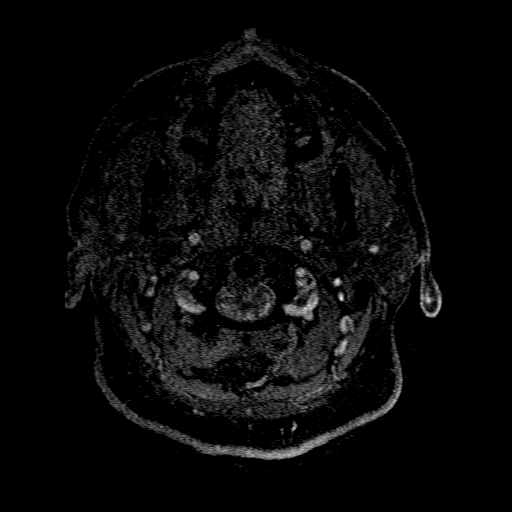
[im 4/176]
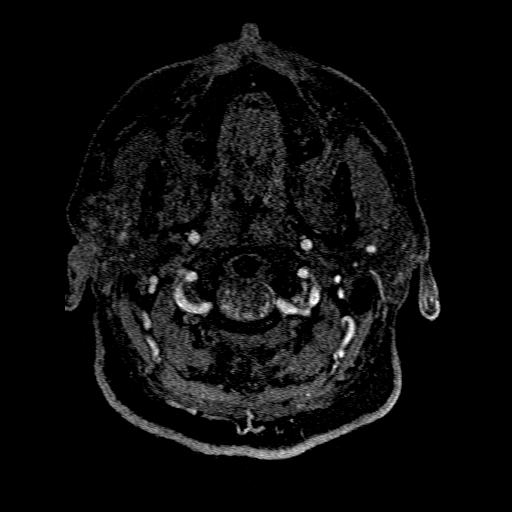
[im 8/176]
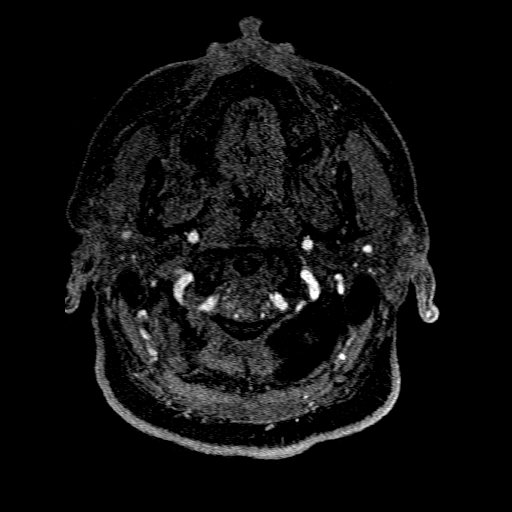
[im 12/176]
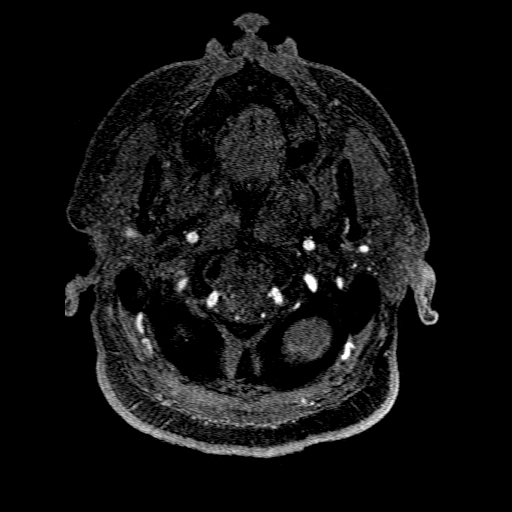
[im 15/176]
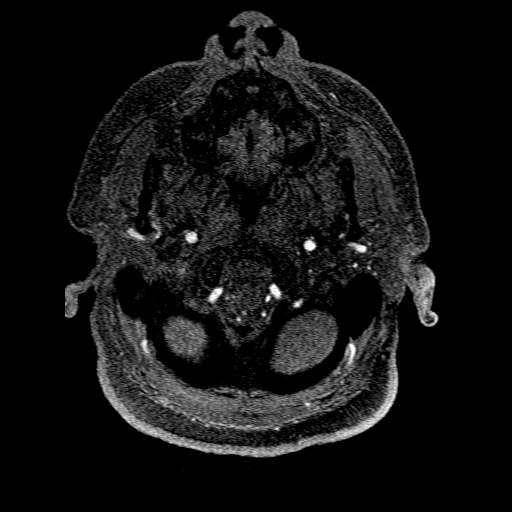
[im 19/176]
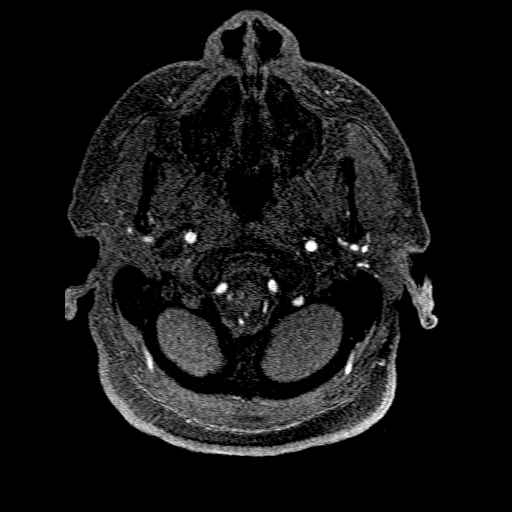
[im 23/176]
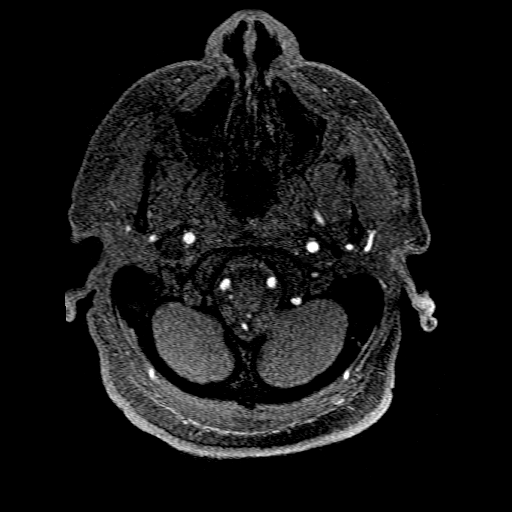
[im 27/176]
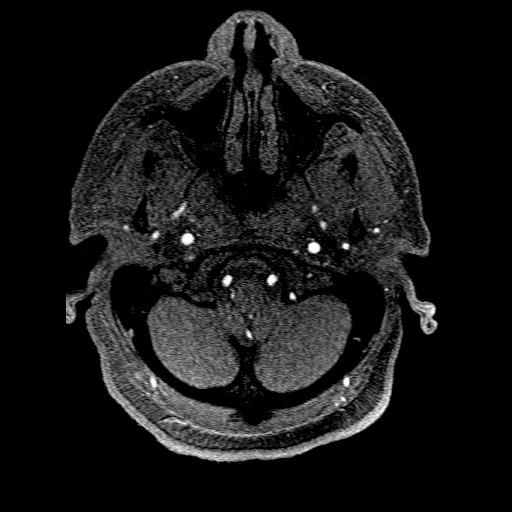
[im 30/176]
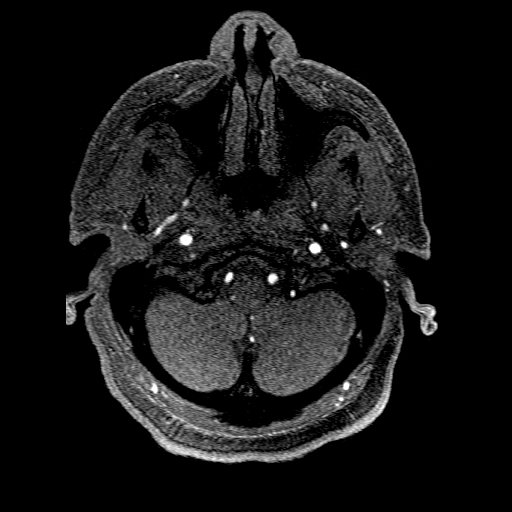
[im 34/176]
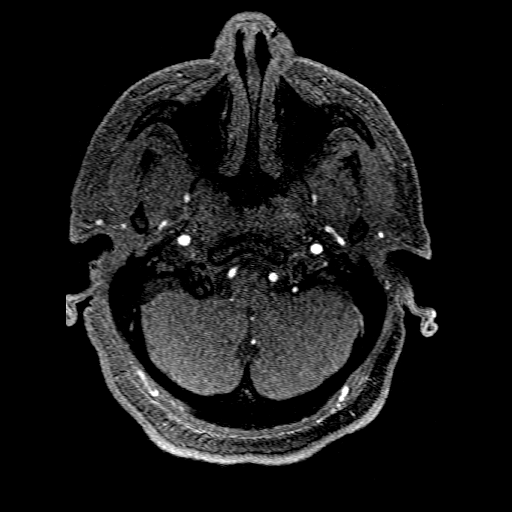
[im 38/176]
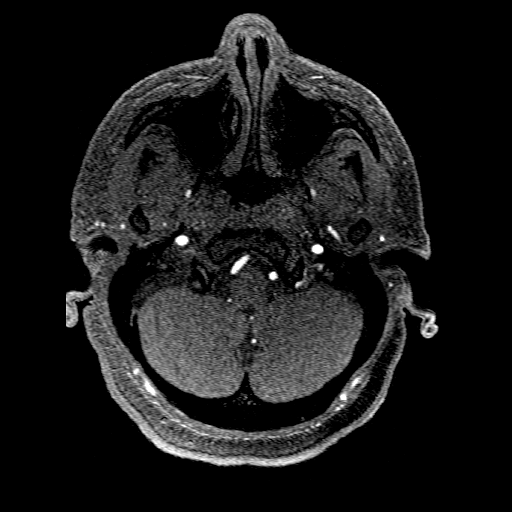
[im 41/176]
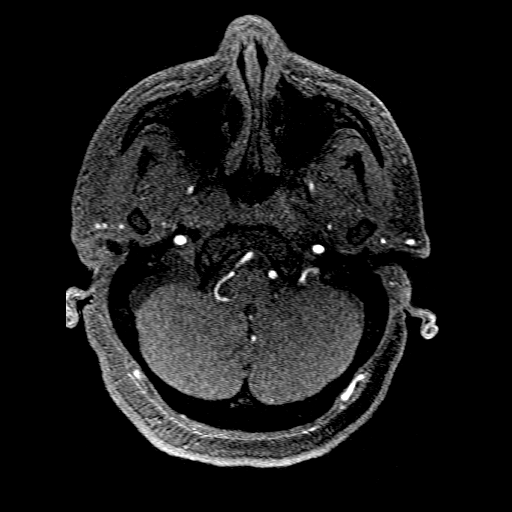
[im 56/176]
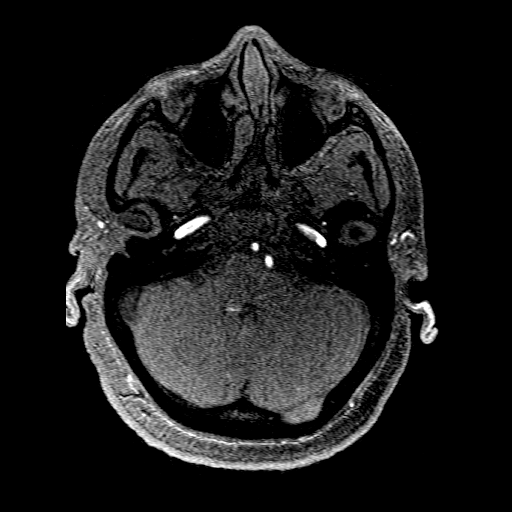
[im 79/176]
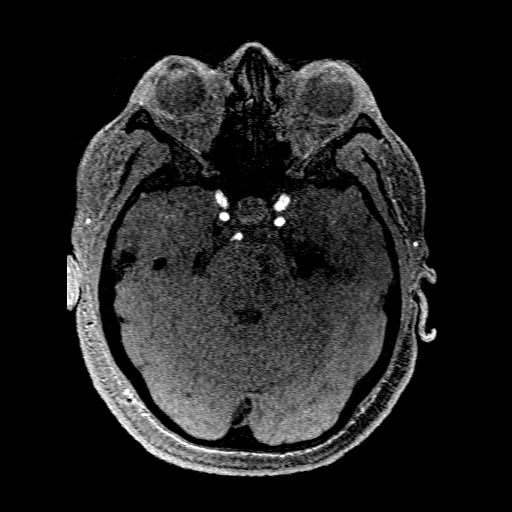
[im 90/176]
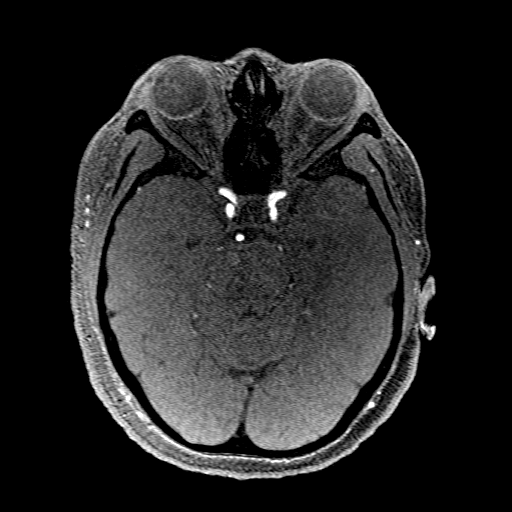
[im 101/176]
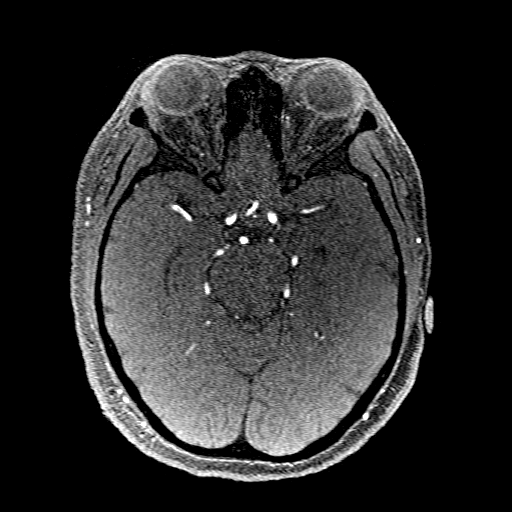
[im 123/176]
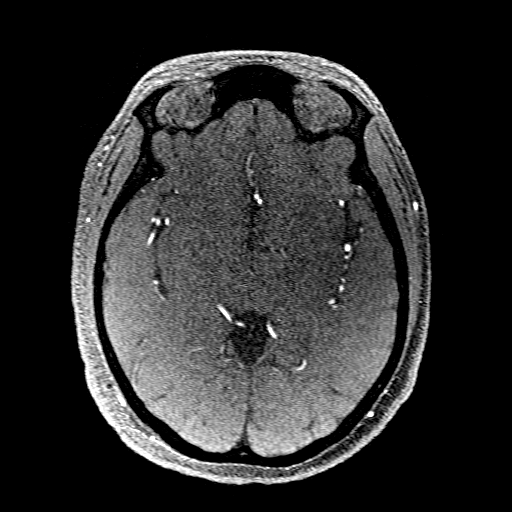
[im 146/176]
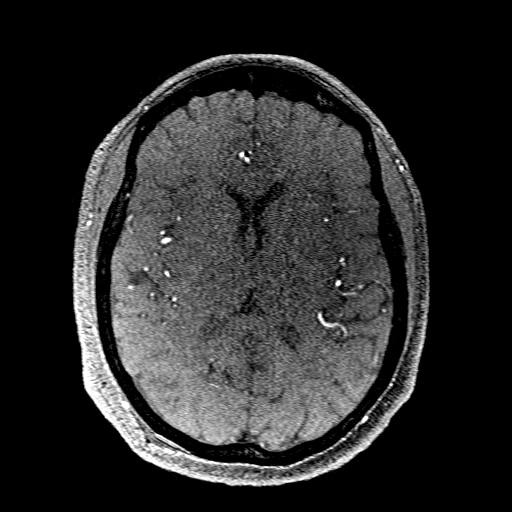
[im 149/176]
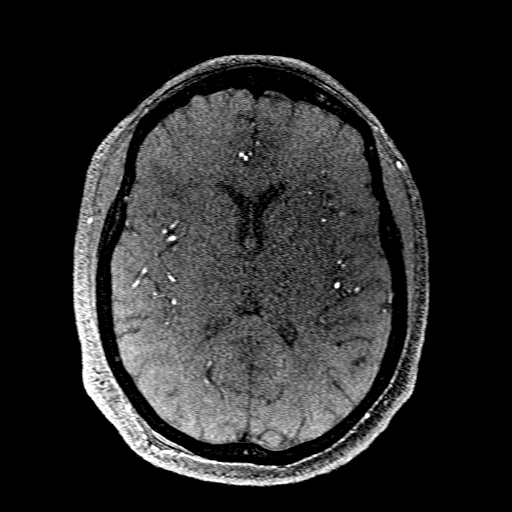
[im 168/176]
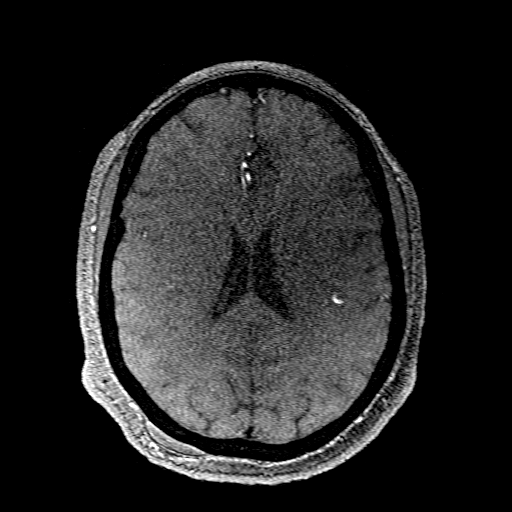

[20 of 48 positions shown; findings below may reference images not displayed]

FINDINGS: Carotid arteries: Evaluation is limited by motion. Bilateral common
carotid arteries and internal carotid arteries are patent without
evidence of significant stenosis. Mild narrowing of the left ICA at
the skull base. Retropharyngeal course of the left carotid. Tortuous
internal carotid arteries bilaterally bilaterally.

Vertebral arteries: Codominant. Bilateral vertebral arteries are
patent without evidence of significant stenosis. Tortuous
bilaterally.
IMPRESSION: 1. No evidence of significant stenosis on this motion limited MRA.
2. Tortuous vessels in the neck, likely secondary to the patient's
known hypertension.

## 2021-01-22 IMAGING — CT CT HEAD CODE STROKE
4 series · 16 of 47 positions shown, 18 images · non-contrast
Comparison: None.

CLINICAL DATA: Code stroke. Neuro facet with acute stroke
suspected.

EXAM:
CT HEAD WITHOUT CONTRAST
TECHNIQUE: Contiguous axial images were obtained from the base of the skull
through the vertex without intravenous contrast.

[Series 2: head 5.0 st · axial · 0.42mm/px · z∈[-138,-18]mm · 7 of 33 slices shown, 9 images]
[im 5/33  brain]
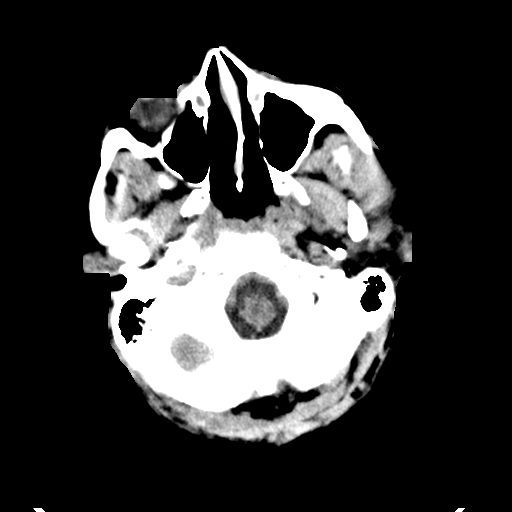
[im 5/33  bone]
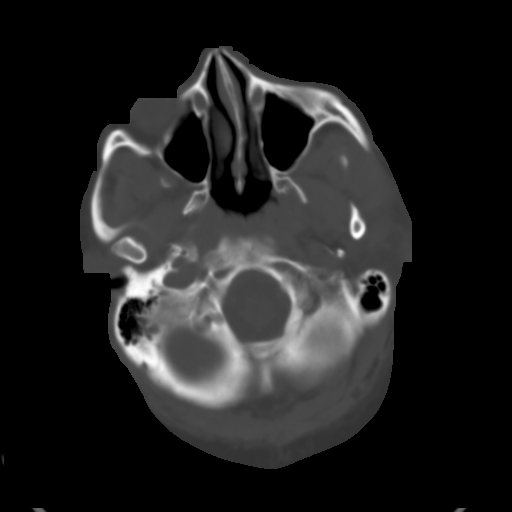
[im 9/33  brain]
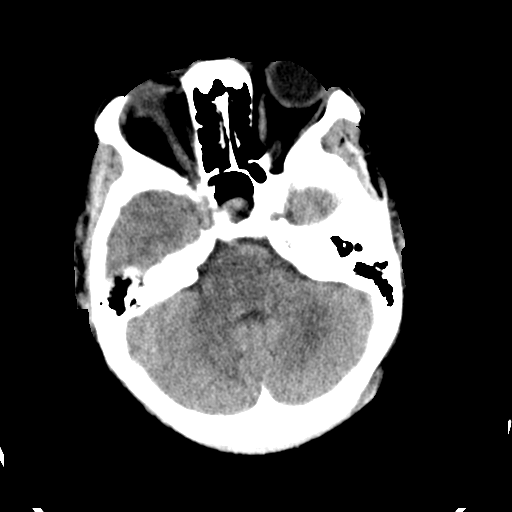
[im 13/33  brain]
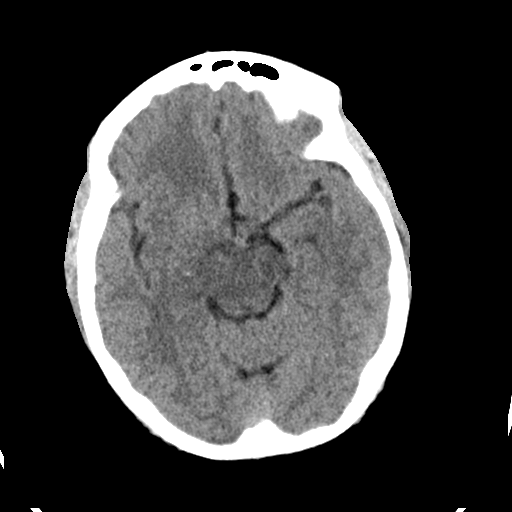
[im 17/33  brain]
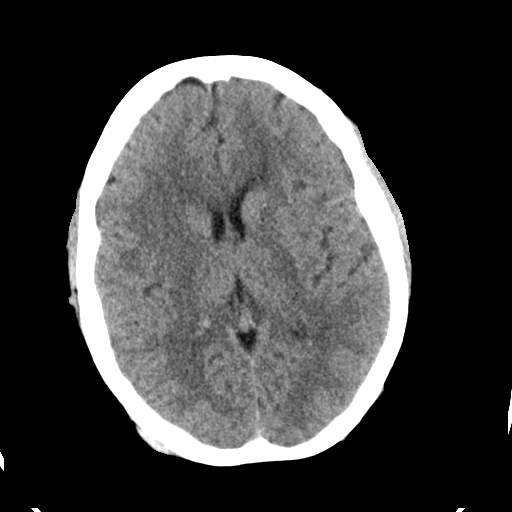
[im 21/33  brain]
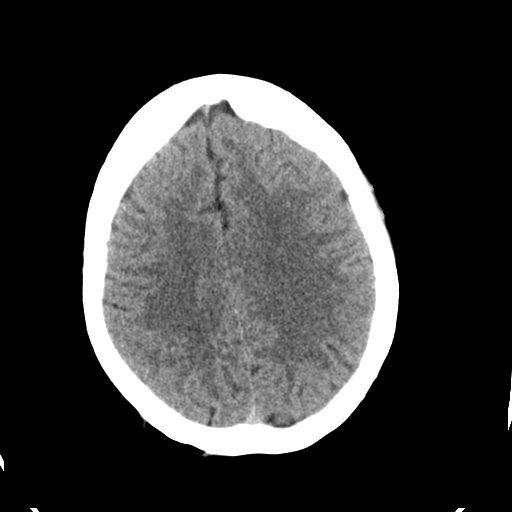
[im 21/33  bone]
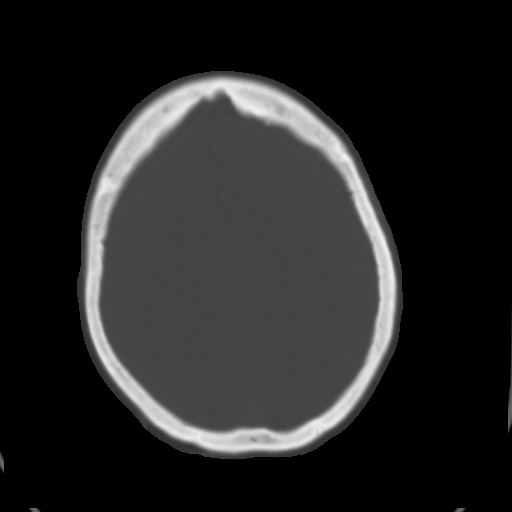
[im 25/33  brain]
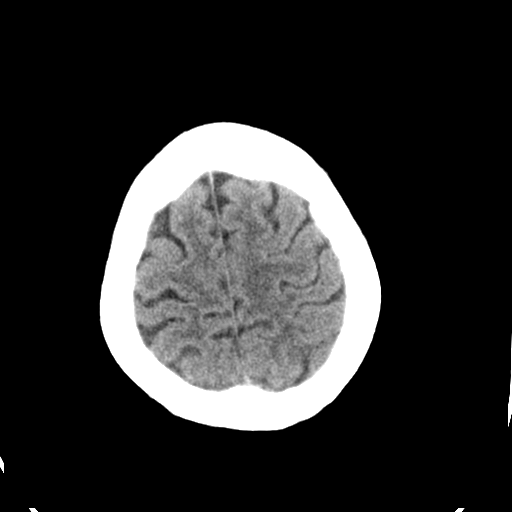
[im 29/33  brain]
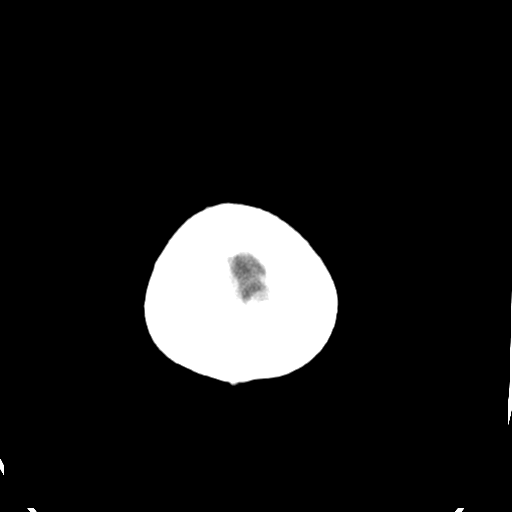

[Series 4: head 2.0 bone · axial · 0.42mm/px · z∈[-142,-110]mm · 3 of 83 slices shown]
[im 9/83  bone]
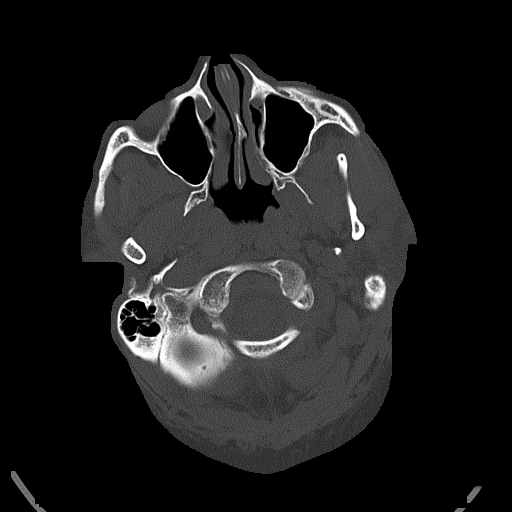
[im 17/83  bone]
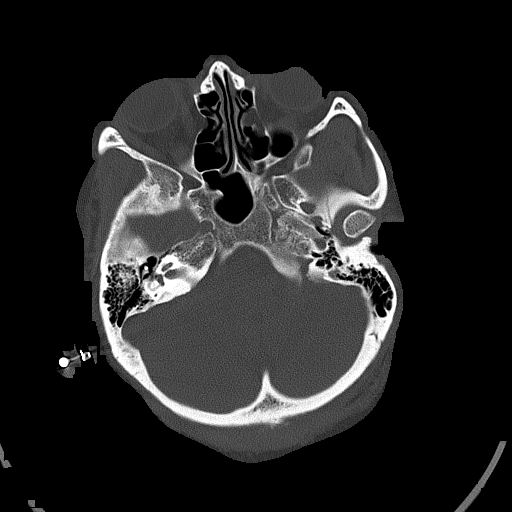
[im 25/83  bone]
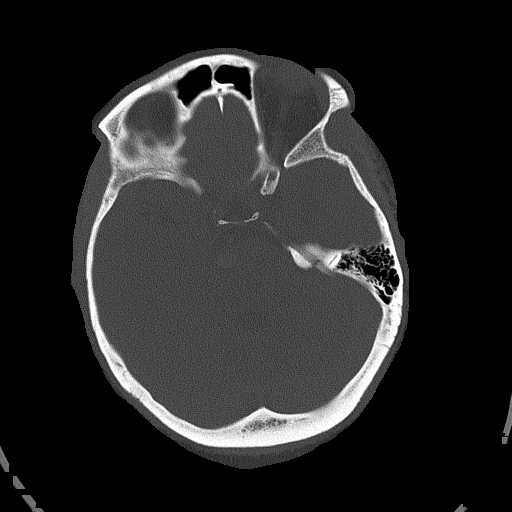

[Series 5: head 3.0 cor st · coronal · 0.32mm/px · 3 of 67 slices shown]
[im 23/67  brain]
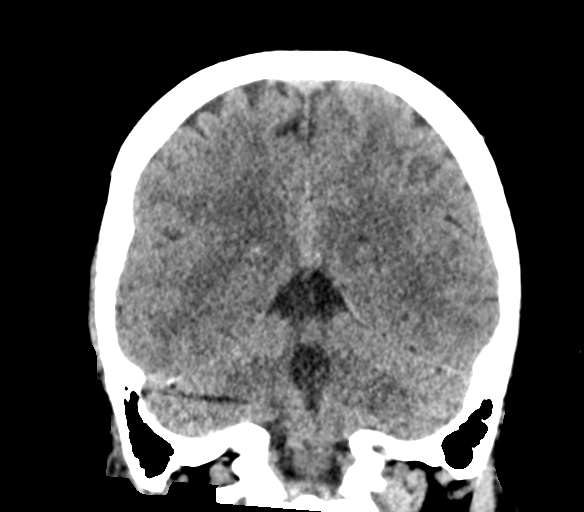
[im 30/67  brain]
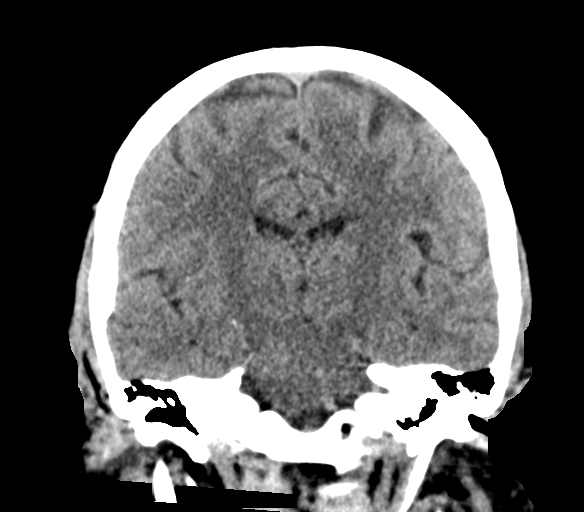
[im 37/67  brain]
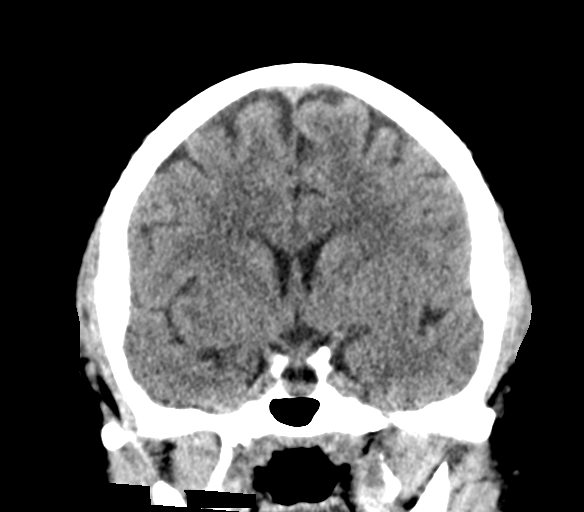

[Series 6: head 3.0 sag st · sagittal · 0.32mm/px · 3 of 67 slices shown]
[im 23/67  brain]
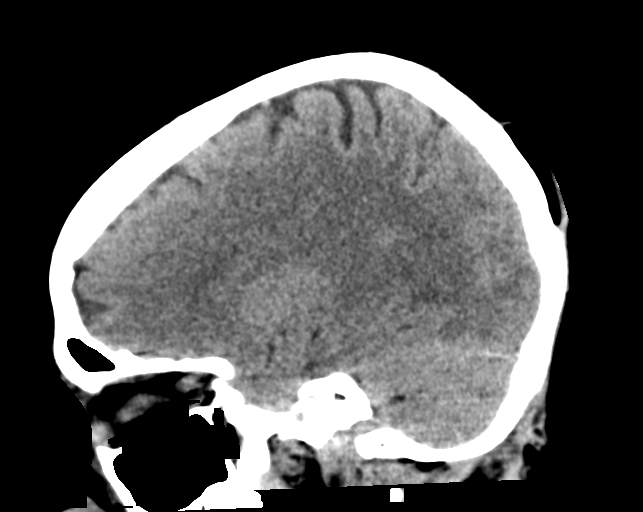
[im 34/67  brain]
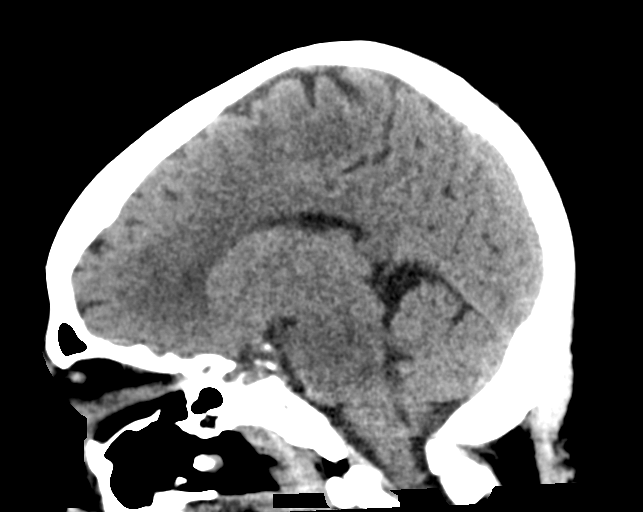
[im 45/67  brain]
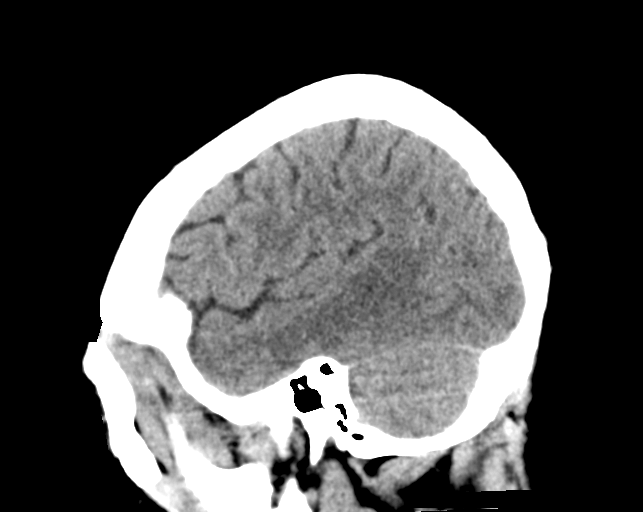

[16 of 47 positions shown; findings below may reference images not displayed]

FINDINGS: Brain: High-density in the right ventral pons most consistent with
hemorrhage. The density is brighter than would be expected for a
hyperdense mass such as cavernoma. Dimensions are 8 mm and there is
no detected adjacent brain edema or mass. No detected infarct,
hydrocephalus, collection, or cerebral mass. Brain volume is normal.
Mild patchy white matter low-density, usually chronic small vessel
ischemia given patient history. There is history of hypertension and
patient is markedly hypertensive currently

Vascular: No hyperdense vessel or unexpected calcification.

Skull: Subgaleal lipoma along the posterior right parietal calvarium
measuring 30 x 7 mm

Sinuses/Orbits: Negative

Other: Critical Value/emergent results were called by telephone at
the time of interpretation on [DATE] at [DATE] to provider
PATHIRATHNA , who verbally acknowledged these results.

ASPECTS ([place] Stroke Program Early CT Score)

Not scored in this setting
IMPRESSION: 1. Subcentimeter acute hemorrhage in the right pons.
2. Chronic white matter disease presumably from patient's
hypertension.

## 2021-01-22 IMAGING — MR MR MRA NECK W/O CM
9 of 11 series · 33 of 48 positions shown · non-contrast
Comparison: None.

CLINICAL DATA: Stroke follow-up.

EXAM:
MRA NECK WITHOUT CONTRAST
TECHNIQUE: Angiographic images of the neck were obtained using MRA technique
without intravenous contrast. Carotid stenosis measurements (when
applicable) are obtained utilizing NASCET criteria, using the distal
internal carotid diameter as the denominator.

[Series 2: DWI · axial · 3.0mm · 0.94mm/px · z∈[-61,+73]mm · 7 of 98 slices shown (1 of 2)]
[im 1/98]
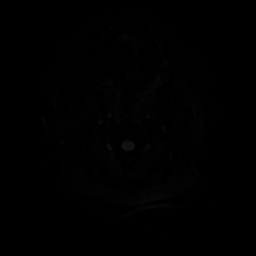
[im 17/98]
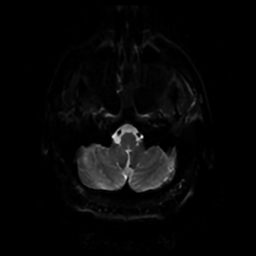
[im 33/98]
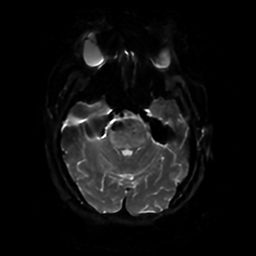
[im 49/98]
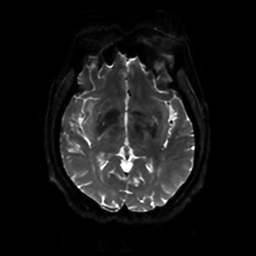
[im 65/98]
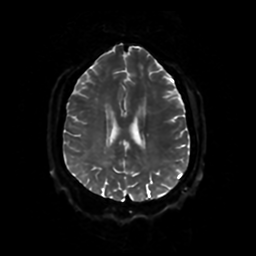
[im 81/98]
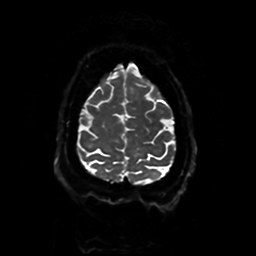
[im 98/98]
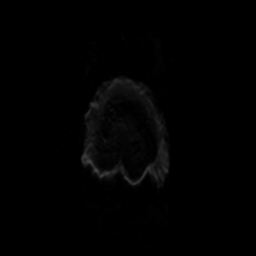

[Series 3: DWI · coronal · 4.0mm · 0.94mm/px · 6 of 71 slices shown (2 of 2)]
[im 1/71]
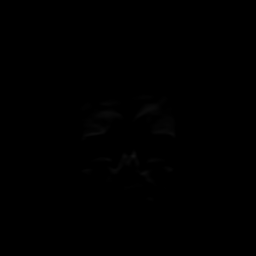
[im 15/71]
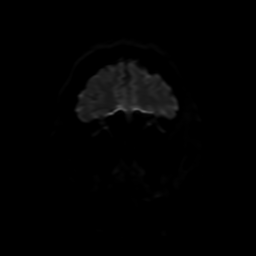
[im 29/71]
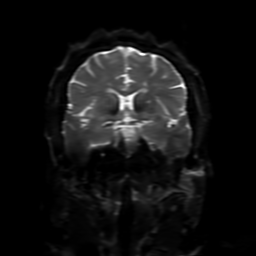
[im 43/71]
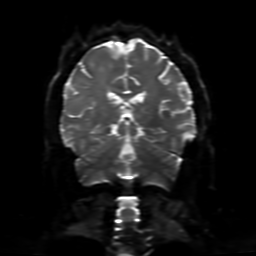
[im 57/71]
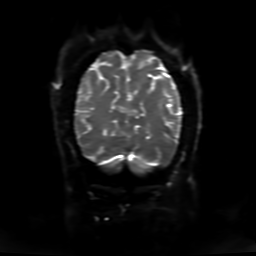
[im 71/71]
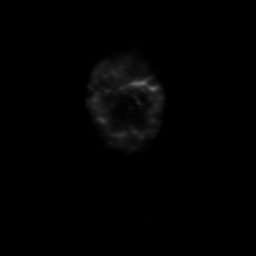

[Series 6: FLAIR · axial · 3.0mm · 0.45mm/px · z∈[-57,+74]mm · 2 of 25 slices shown (1 of 2)]
[im 1/25]
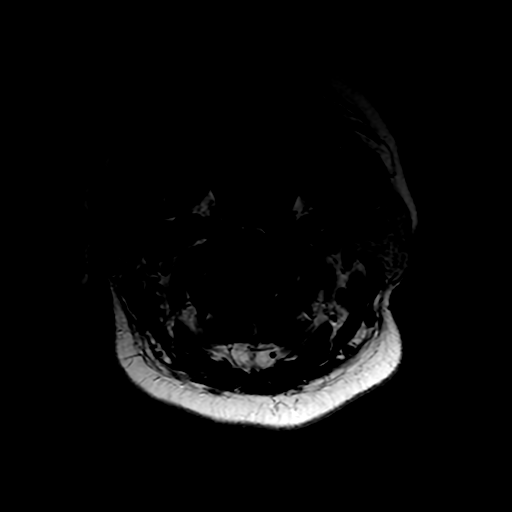
[im 25/25]
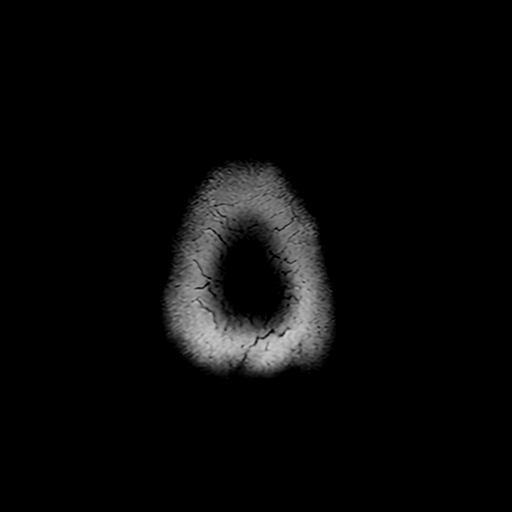

[Series 7: (person_name) · axial · 3.0mm · 0.47mm/px · z∈[-61,+15]mm · 5 of 100 slices shown]
[im 1/100]
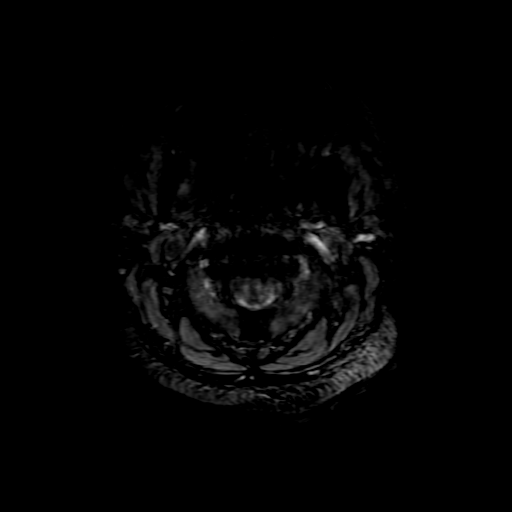
[im 15/100]
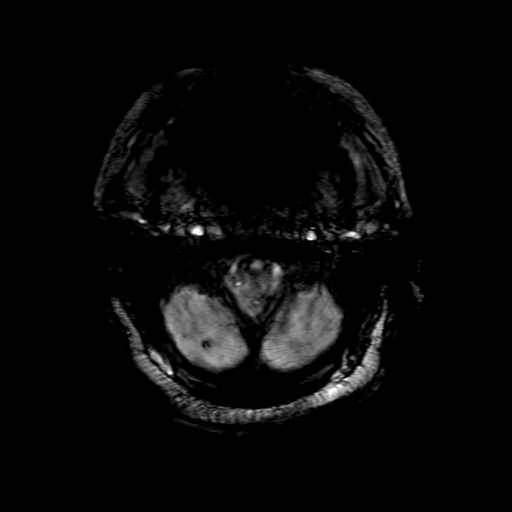
[im 29/100]
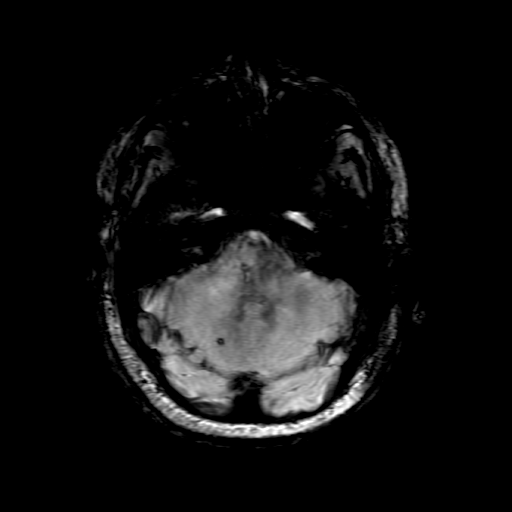
[im 43/100]
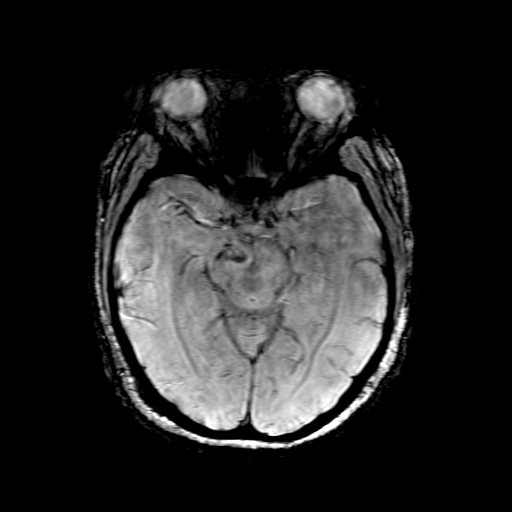
[im 57/100]
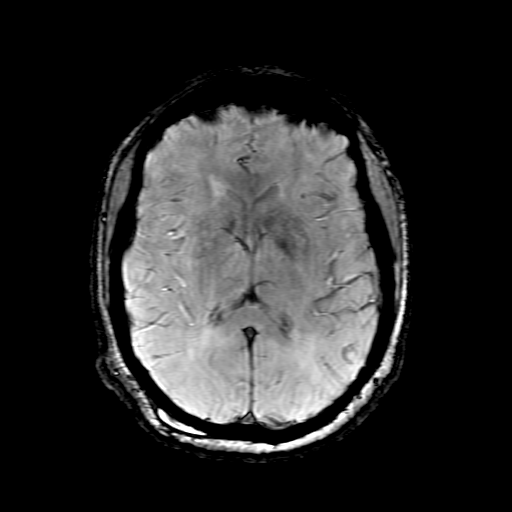

[Series 9: FLAIR · sagittal · 5.0mm · 0.47mm/px · 2 of 25 slices shown (2 of 2)]
[im 1/25]
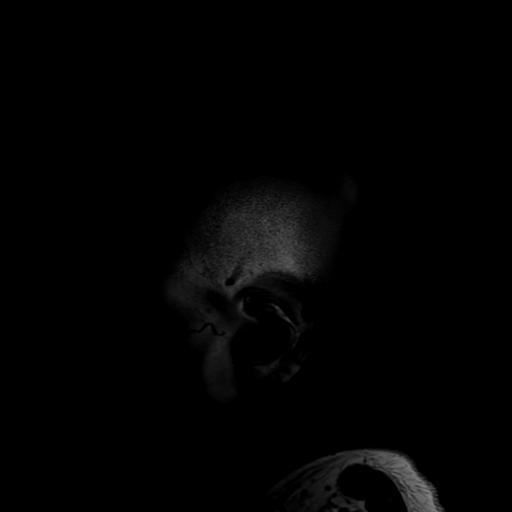
[im 25/25]
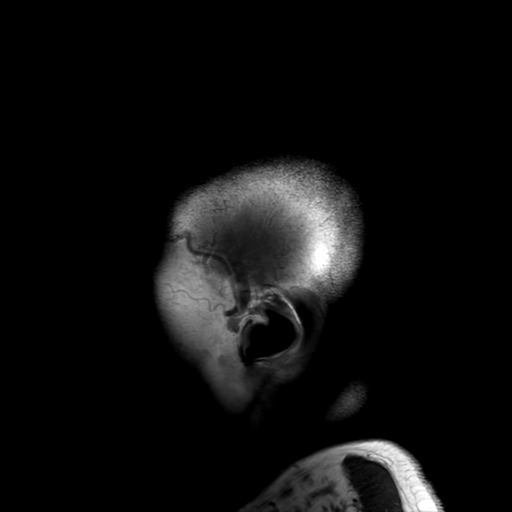

[Series 10: T2 · axial · 5.0mm · 0.47mm/px · z∈[-59,+72]mm · 2 of 25 slices shown (1 of 2)]
[im 1/25]
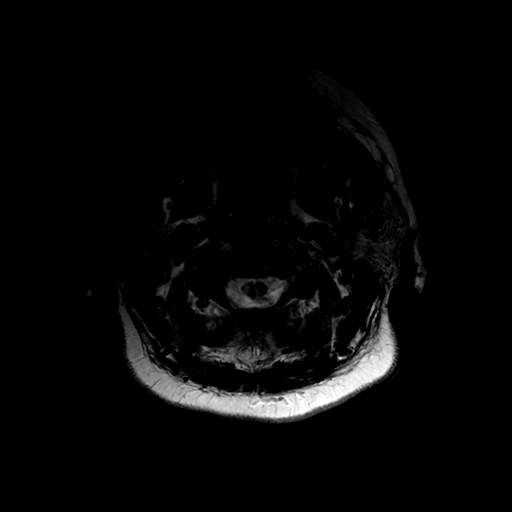
[im 25/25]
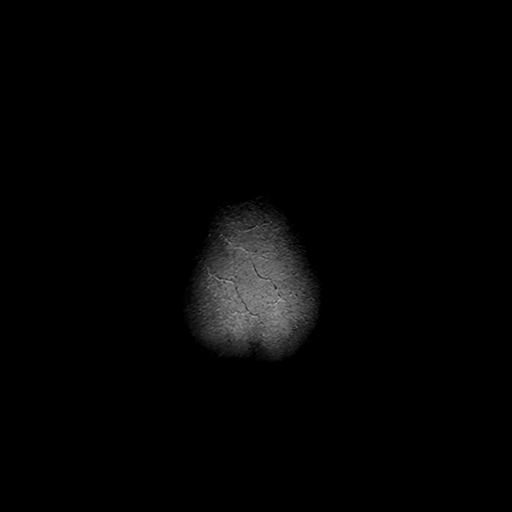

[Series 12: T2 · coronal · 5.0mm · 0.39mm/px · 2 of 30 slices shown (2 of 2)]
[im 1/30]
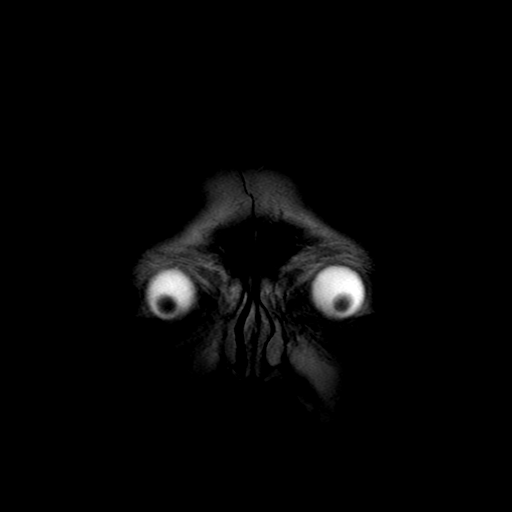
[im 30/30]
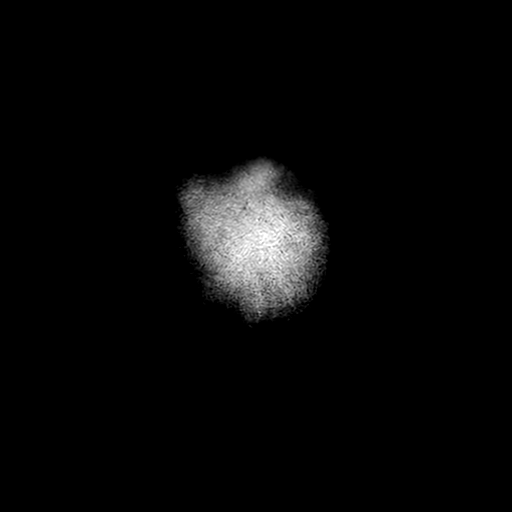

[Series 250: ADC · axial · 3.0mm · 0.94mm/px · z∈[-61,+73]mm · 4 of 50 slices shown (1 of 2)]
[im 1/50]
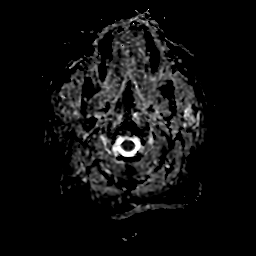
[im 17/50]
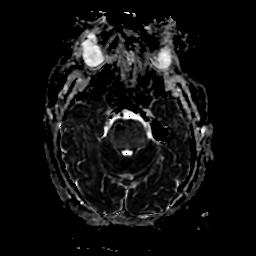
[im 33/50]
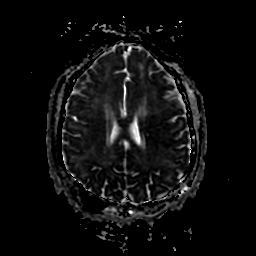
[im 50/50]
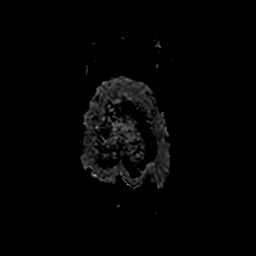

[Series 350: ADC · coronal · 4.0mm · 0.94mm/px · 3 of 36 slices shown (2 of 2)]
[im 1/36]
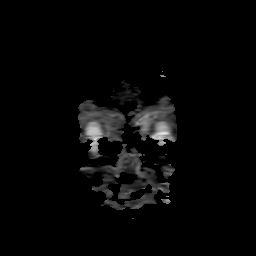
[im 18/36]
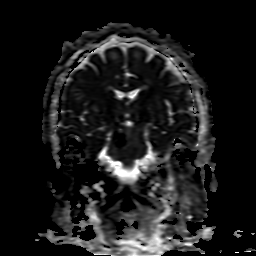
[im 36/36]
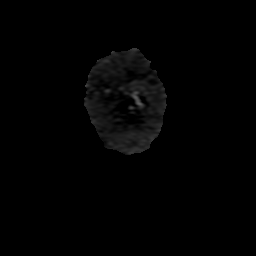

[33 of 48 positions shown; findings below may reference images not displayed]

FINDINGS: Carotid arteries: Evaluation is limited by motion. Bilateral common
carotid arteries and internal carotid arteries are patent without
evidence of significant stenosis. Mild narrowing of the left ICA at
the skull base. Retropharyngeal course of the left carotid. Tortuous
internal carotid arteries bilaterally bilaterally.

Vertebral arteries: Codominant. Bilateral vertebral arteries are
patent without evidence of significant stenosis. Tortuous
bilaterally.
IMPRESSION: 1. No evidence of significant stenosis on this motion limited MRA.
2. Tortuous vessels in the neck, likely secondary to the patient's
known hypertension.

## 2021-01-22 IMAGING — MR MR MRA NECK W/O CM
2 series · 19 of 48 positions shown · non-contrast
Comparison: None.

CLINICAL DATA: Stroke follow-up.

EXAM:
MRA NECK WITHOUT CONTRAST
TECHNIQUE: Angiographic images of the neck were obtained using MRA technique
without intravenous contrast. Carotid stenosis measurements (when
applicable) are obtained utilizing NASCET criteria, using the distal
internal carotid diameter as the denominator.

[Series 8: TOF · axial · 2.3mm · 0.47mm/px · z∈[-203,-34]mm · 18 of 164 slices shown (1 of 2)]
[im 1/164]
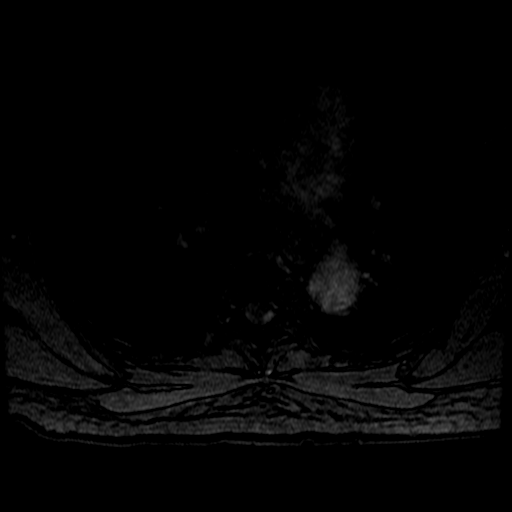
[im 4/164]
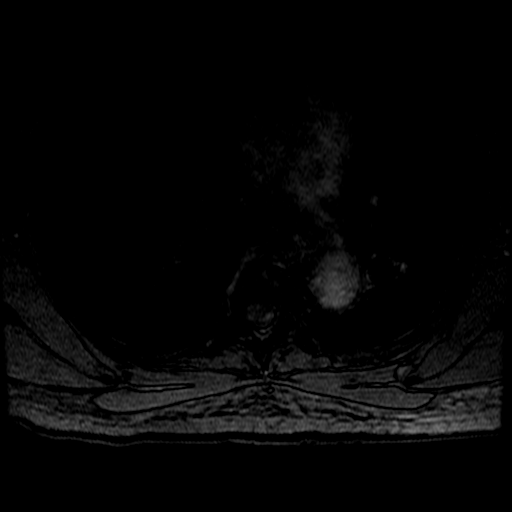
[im 8/164]
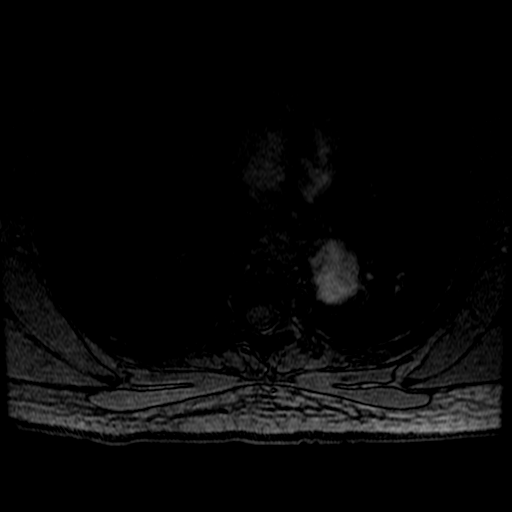
[im 11/164]
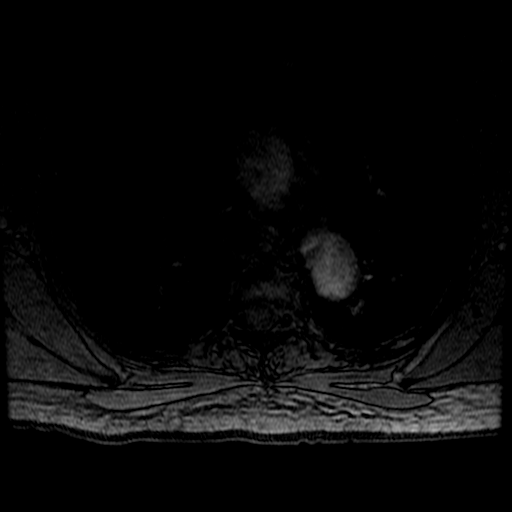
[im 15/164]
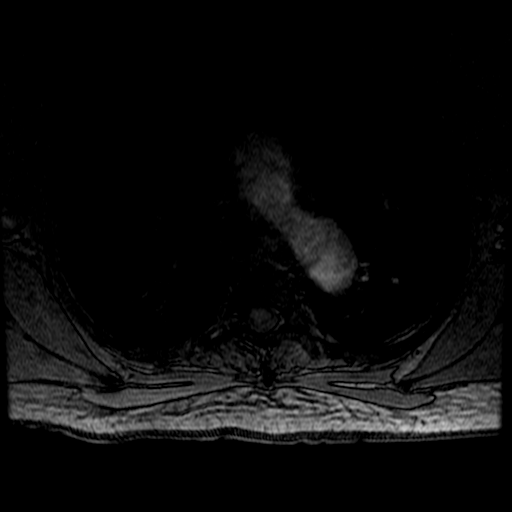
[im 18/164]
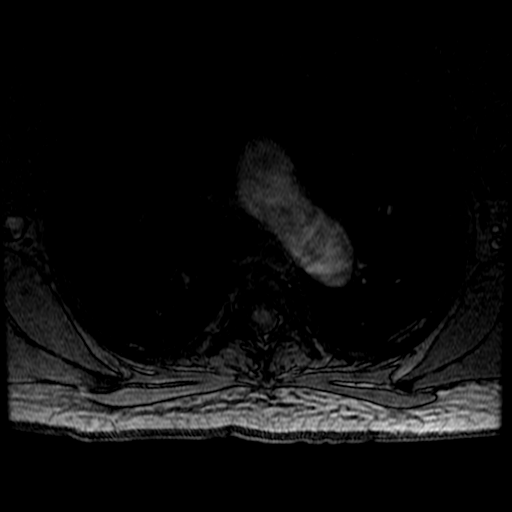
[im 22/164]
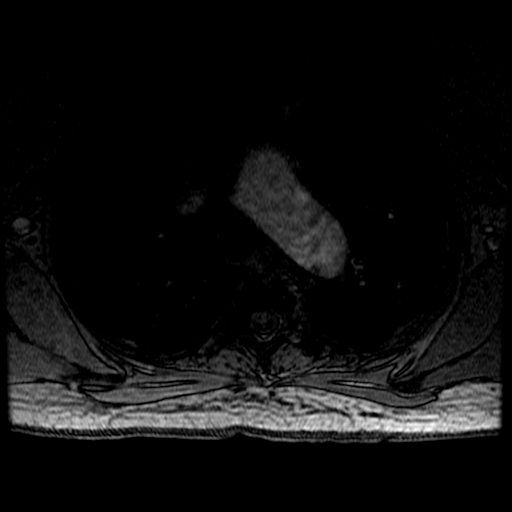
[im 25/164]
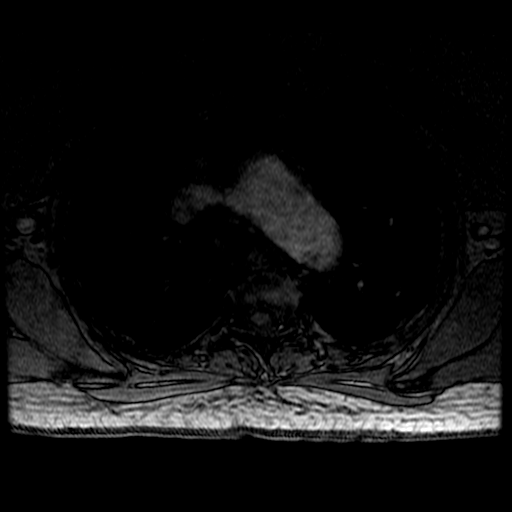
[im 29/164]
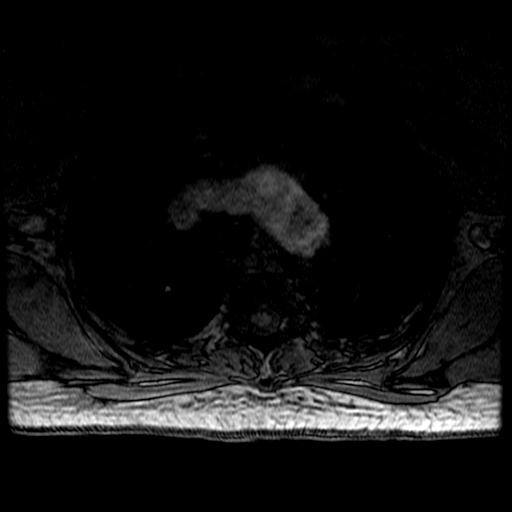
[im 32/164]
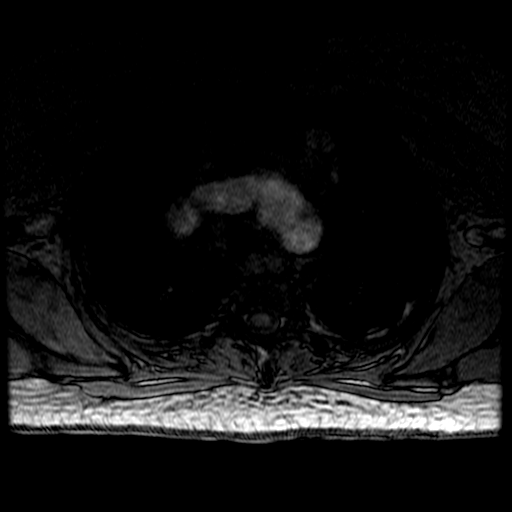
[im 50/164]
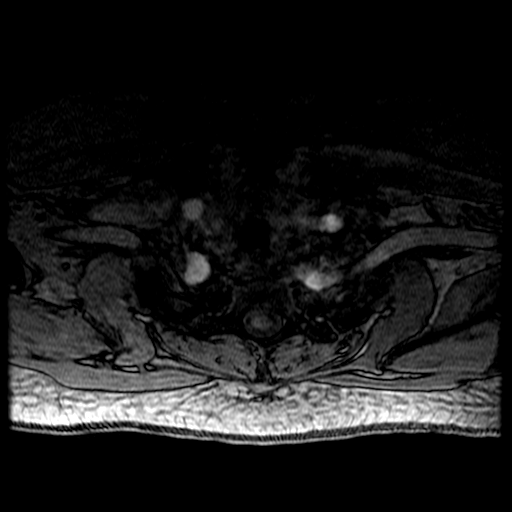
[im 71/164]
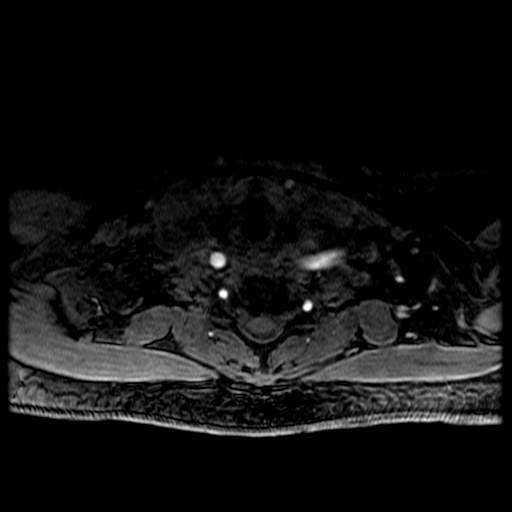
[im 82/164]
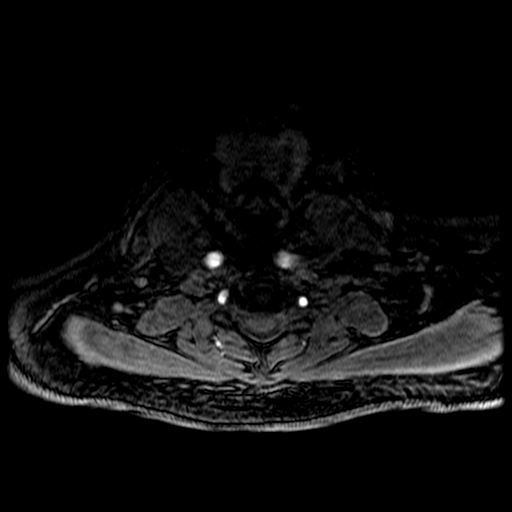
[im 93/164]
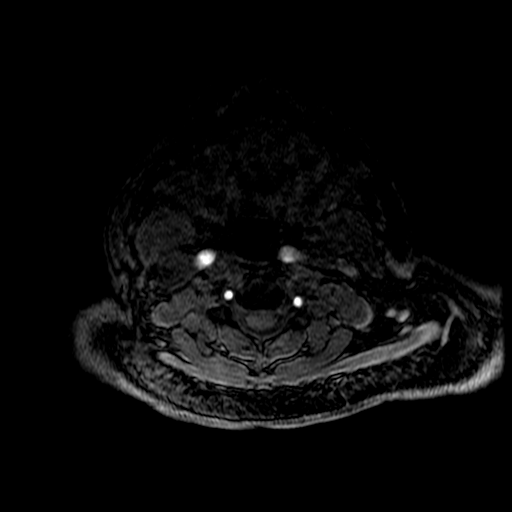
[im 114/164]
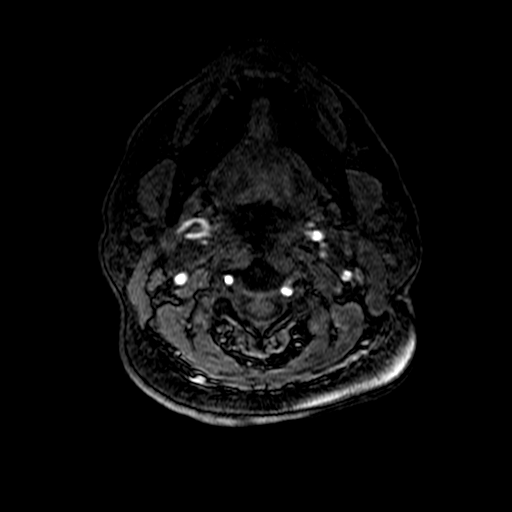
[im 135/164]
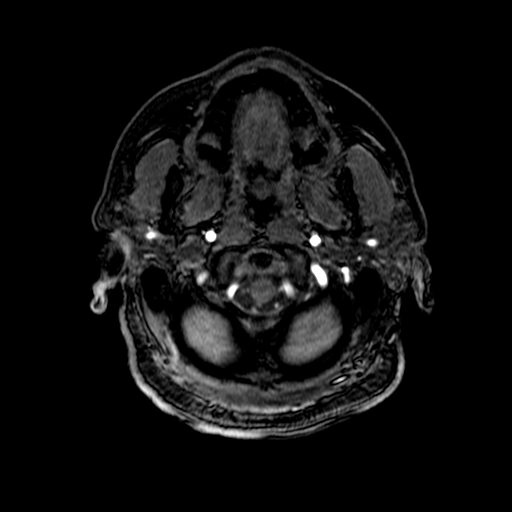
[im 139/164]
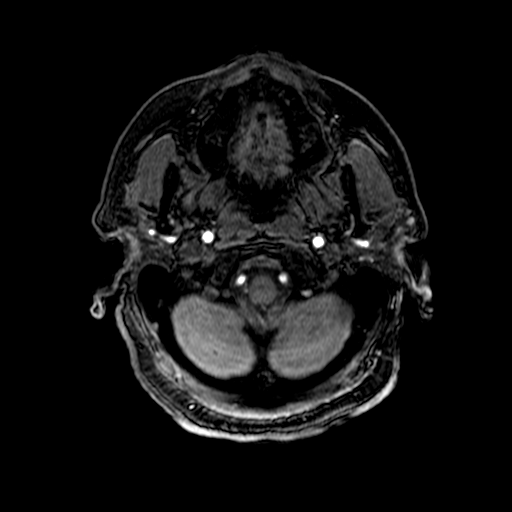
[im 156/164]
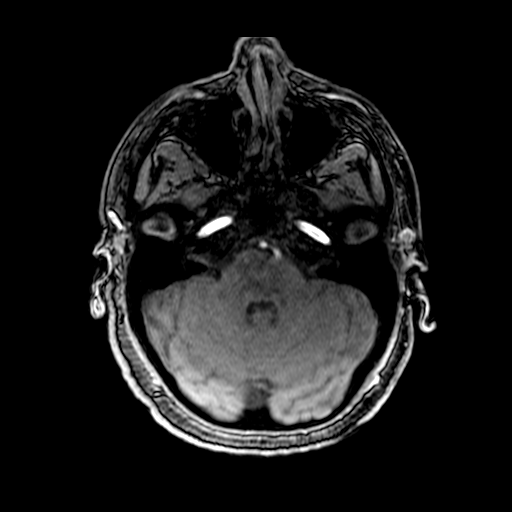

[Series 801: TOF · sagittal · 2.3mm · 0.47mm/px · 1 of 5 slices shown (2 of 2)]
[im 1/5]
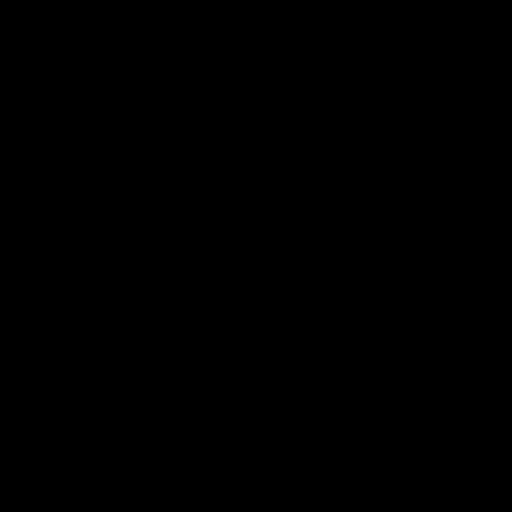

[19 of 48 positions shown; findings below may reference images not displayed]

FINDINGS: Carotid arteries: Evaluation is limited by motion. Bilateral common
carotid arteries and internal carotid arteries are patent without
evidence of significant stenosis. Mild narrowing of the left ICA at
the skull base. Retropharyngeal course of the left carotid. Tortuous
internal carotid arteries bilaterally bilaterally.

Vertebral arteries: Codominant. Bilateral vertebral arteries are
patent without evidence of significant stenosis. Tortuous
bilaterally.
IMPRESSION: 1. No evidence of significant stenosis on this motion limited MRA.
2. Tortuous vessels in the neck, likely secondary to the patient's
known hypertension.

## 2021-01-22 MED ORDER — SENNOSIDES-DOCUSATE SODIUM 8.6-50 MG PO TABS
1.0000 | ORAL_TABLET | Freq: Two times a day (BID) | ORAL | Status: DC
  Administered 2021-01-23 – 2021-01-24 (×4): 1 via ORAL
  Filled 2021-01-22 (×4): qty 1

## 2021-01-22 MED ORDER — LABETALOL HCL 5 MG/ML IV SOLN
10.0000 mg | INTRAVENOUS | Status: DC | PRN
  Administered 2021-01-22 – 2021-01-25 (×8): 10 mg via INTRAVENOUS
  Filled 2021-01-22 (×7): qty 4

## 2021-01-22 MED ORDER — ACETAMINOPHEN 160 MG/5ML PO SOLN: 650.0000 mg | ORAL | Status: DC | PRN

## 2021-01-22 MED ORDER — CLEVIDIPINE BUTYRATE 0.5 MG/ML IV EMUL
0.0000 mg/h | INTRAVENOUS | Status: DC
  Administered 2021-01-22 (×2): 21 mg/h via INTRAVENOUS
  Administered 2021-01-22: 17 mg/h via INTRAVENOUS
  Administered 2021-01-22: 21 mg/h via INTRAVENOUS
  Administered 2021-01-22: 18 mg/h via INTRAVENOUS
  Administered 2021-01-22: 21 mg/h via INTRAVENOUS
  Administered 2021-01-23: 18 mg/h via INTRAVENOUS
  Administered 2021-01-23 – 2021-01-24 (×6): 21 mg/h via INTRAVENOUS
  Administered 2021-01-24: 17 mg/h via INTRAVENOUS
  Administered 2021-01-24: 7 mg/h via INTRAVENOUS
  Administered 2021-01-24: 21 mg/h via INTRAVENOUS
  Administered 2021-01-24: 17 mg/h via INTRAVENOUS
  Administered 2021-01-24: 18 mg/h via INTRAVENOUS
  Administered 2021-01-25: 4 mg/h via INTRAVENOUS
  Administered 2021-01-25: 7 mg/h via INTRAVENOUS
  Filled 2021-01-22 (×2): qty 100
  Filled 2021-01-22: qty 50
  Filled 2021-01-22 (×3): qty 100
  Filled 2021-01-22: qty 50
  Filled 2021-01-22 (×5): qty 100
  Filled 2021-01-22: qty 50
  Filled 2021-01-22: qty 100
  Filled 2021-01-22: qty 50
  Filled 2021-01-22 (×4): qty 100
  Filled 2021-01-22: qty 200
  Filled 2021-01-22: qty 100

## 2021-01-22 MED ORDER — ACETAMINOPHEN 325 MG PO TABS
650.0000 mg | ORAL_TABLET | ORAL | Status: DC | PRN
  Administered 2021-01-23 – 2021-01-27 (×2): 650 mg via ORAL
  Filled 2021-01-22 (×2): qty 2

## 2021-01-22 MED ORDER — LABETALOL HCL 5 MG/ML IV SOLN
INTRAVENOUS | Status: AC | PRN
  Administered 2021-01-22: 10 mg via INTRAVENOUS

## 2021-01-22 MED ORDER — CHLORHEXIDINE GLUCONATE CLOTH 2 % EX PADS
6.0000 | MEDICATED_PAD | Freq: Every day | CUTANEOUS | Status: DC
  Administered 2021-01-22 – 2021-01-26 (×5): 6 via TOPICAL

## 2021-01-22 MED ORDER — CLEVIDIPINE BUTYRATE 0.5 MG/ML IV EMUL
INTRAVENOUS | Status: AC | PRN
  Administered 2021-01-22: 1 mg/h via INTRAVENOUS

## 2021-01-22 MED ORDER — STROKE: EARLY STAGES OF RECOVERY BOOK
Freq: Once | Status: AC
  Administered 2021-01-22: 1
  Filled 2021-01-22 (×2): qty 1

## 2021-01-22 MED ORDER — NICARDIPINE HCL IN NACL 20-0.86 MG/200ML-% IV SOLN: 0.0000 mg/h | INTRAVENOUS | Status: DC

## 2021-01-22 MED ORDER — ACETAMINOPHEN 650 MG RE SUPP: 650.0000 mg | RECTAL | Status: DC | PRN

## 2021-01-22 MED ORDER — PANTOPRAZOLE SODIUM 40 MG IV SOLR
40.0000 mg | Freq: Every day | INTRAVENOUS | Status: DC
  Administered 2021-01-22 – 2021-01-24 (×3): 40 mg via INTRAVENOUS
  Filled 2021-01-22 (×3): qty 40

## 2021-01-22 MED ORDER — CLEVIDIPINE BUTYRATE 0.5 MG/ML IV EMUL
INTRAVENOUS | Status: AC
  Administered 2021-01-22: 18 mg/h via INTRAVENOUS
  Filled 2021-01-22: qty 50

## 2021-01-22 NOTE — ED Notes (Addendum)
Slurred speech noted intermitting, denies facial numbness, no drifts. Sx increased as assessment continued

## 2021-01-22 NOTE — ED Notes (Signed)
Got patient undressed on the monitor did ekg shown to Dr Eulis Foster patient is resting with call bell in reach

## 2021-01-22 NOTE — Progress Notes (Signed)
Initial Nutrition Assessment  DOCUMENTATION CODES:   Not applicable  INTERVENTION:   Monitor for diet advancement    NUTRITION DIAGNOSIS:   Inadequate oral intake related to inability to eat as evidenced by NPO status.  GOAL:   Patient will meet greater than or equal to 90% of their needs  MONITOR:   Diet advancement  REASON FOR ASSESSMENT:   Malnutrition Screening Tool    ASSESSMENT:   Pt with PMH of uncontrolled HTN, CKD, anemia, tobacco and marijuana use who is now admitted with acute R pons hemorrhagic stroke due to hypertensive emergency and possible PRES/RCVS.   Pt NPO awaiting SLP evaluation   Medications reviewed and include: senokot-s Cleviprex @ 34 ml/hr provides: 1632 kcal  Labs reviewed    NUTRITION - FOCUSED PHYSICAL EXAM:  Deferred; pt out of room for testing  Diet Order:   Diet Order            Diet NPO time specified  Diet effective now                 EDUCATION NEEDS:   No education needs have been identified at this time  Skin:  Skin Assessment: Reviewed RN Assessment  Last BM:  4/19  Height:   Ht Readings from Last 1 Encounters:  07/20/20 '4\' 11"'$  (1.499 m)    Weight:   Wt Readings from Last 1 Encounters:  07/22/20 87.1 kg    Ideal Body Weight:     BMI:  There is no height or weight on file to calculate BMI.  Estimated Nutritional Needs:   Kcal:  1800-2000  Protein:  85-100 grams  Fluid:  >1.9 L/day  Lockie Pares., RD, LDN, CNSC See AMiON for contact information

## 2021-01-22 NOTE — ED Provider Notes (Signed)
[place] EMERGENCY DEPARTMENT Provider Note   CSN: [id number] Arrival date & time:     An emergency department physician performed an initial assessment on this suspected stroke patient at 61.  History Chief Complaint  Patient presents with  . Hypertension    Robin Navarro is a 55 y.o. female.  HPI 55 year old female with a history of CKD, hypertension, anemia, hypertensive emergency with noncompliance to medication presents to the ER with complaints of elevated blood pressures and word slurring.  Patient states that she woke up at around 5 AM and had some right-sided facial numbness, with left arm tingling, and felt like her speech was slurred.  She then went back to bed but when she woke up again states that her facial numbness and tingling improved, however she feels like her speech is still slurred.  Blood pressure on arrival to 290/160, reports compliance with blood pressure medicines but did not take this morning.  She also endorses a headache, 6/10 in intensity.  Per pharmacy review however, patient reportedly not refilling her blood pressure medications.  She denies any weakness, numbness, no blurry vision.  No chest pain or shortness of breath.    Past Medical History:  Diagnosis Date  . Anemia   . Chronic kidney disease   . Hypertension     Patient Active Problem List   Diagnosis Date Noted  . Stroke due to intracerebral hemorrhage ([place]) 01/22/2021  . Hypokalemia 07/19/2020  . Elevated troponin 07/19/2020  . Abnormal EKG 07/19/2020  . Visual disturbance 07/19/2020  . Hypertensive emergency 07/19/2020  . AKI (acute kidney injury) ([place]) 06/13/2020  . Normocytic anemia 06/13/2020  . Acute kidney injury ([place]) 06/13/2020  . Acute bronchitis 02/11/2013  . Community acquired pneumonia 02/10/2013  . HTN (hypertension) 02/10/2013    Past Surgical History:  Procedure Laterality Date  . NO PAST SURGERIES       OB History    Gravida  1    Para  1   Term      Preterm      AB      Living        SAB      IAB      Ectopic      Multiple      Live Births              Family History  Problem Relation Age of Onset  . Diabetes Mother   . Hypertension Mother     Social History   Tobacco Use  . Smoking status: Current Every Day Smoker    Packs/day: 0.01    Types: Cigars  . Smokeless tobacco: Never Used  Substance Use Topics  . Alcohol use: Yes    Comment: 1 every six months  . Drug use: Yes    Types: Marijuana    Home Medications Prior to Admission medications   Medication Sig Start Date End Date Taking? Authorizing Provider  acetaminophen (TYLENOL) 500 MG tablet Take 500 mg by mouth as needed for mild pain or moderate pain.    [provider]  amLODipine (NORVASC) 10 MG tablet Take 1 tablet (10 mg total) by mouth daily. 07/22/20   Hosie Poisson, MD  carvedilol (COREG) 12.5 MG tablet Take 1 tablet (12.5 mg total) by mouth 2 (two) times daily with a meal. 07/22/20   Hosie Poisson, MD  chlorthalidone (HYGROTON) 25 MG tablet Take 1 tablet (25 mg total) by mouth daily. 07/22/20   Hosie Poisson,  MD  hydrALAZINE (APRESOLINE) 50 MG tablet Take 1 tablet (50 mg total) by mouth 3 (three) times daily. 07/22/20   Hosie Poisson, MD  senna-docusate (SENOKOT-S) 8.6-50 MG tablet TAKE 1 TABLET BY MOUTH AT BEDTIME AS NEEDED FOR MILD CONSTIPATION. 07/22/20 07/22/21  Hosie Poisson, MD    Allergies    Patient has no known allergies.  Review of Systems   Review of Systems  Constitutional: Negative for chills and fever.  HENT: Negative for ear pain and sore throat.   Eyes: Negative for pain and visual disturbance.  Respiratory: Negative for cough and shortness of breath.   Cardiovascular: Negative for chest pain and palpitations.  Gastrointestinal: Negative for abdominal pain and vomiting.  Genitourinary: Negative for dysuria and hematuria.  Musculoskeletal: Negative for arthralgias and back pain.  Skin:  Negative for color change and rash.  Neurological: Positive for speech difficulty and headaches. Negative for seizures, syncope and facial asymmetry.  Psychiatric/Behavioral: Negative for confusion.  All other systems reviewed and are negative.   Physical Exam Updated Vital Signs BP (!) 141/93   Pulse 74   Temp 97.8 F (36.6 C) (Oral)   Resp 17   LMP 12/23/2013   SpO2 100%   Physical Exam Vitals and nursing note reviewed.  Constitutional:      General: She is not in acute distress.    Appearance: She is well-developed.  HENT:     Head: Normocephalic and atraumatic.  Eyes:     Conjunctiva/sclera: Conjunctivae normal.  Cardiovascular:     Rate and Rhythm: Normal rate and regular rhythm.     Heart sounds: No murmur heard.   Pulmonary:     Effort: Pulmonary effort is normal. No respiratory distress.     Breath sounds: Normal breath sounds.  Abdominal:     Palpations: Abdomen is soft.     Tenderness: There is no abdominal tenderness.  Musculoskeletal:     Cervical back: Neck supple.  Skin:    General: Skin is warm and dry.  Neurological:     Mental Status: She is alert.     Comments: Mental Status:  Alert, thought content appropriate, able to give a coherent history.  Speech intermittently slurred with no evidence of aphasia.  Able to follow 2 step commands without difficulty.  Cranial Nerves:  II: Peripheral visual fields grossly normal, pupils equal, round, reactive to light III,IV, VI: ptosis not present, extra-ocular motions intact bilaterally  V,VII: smile symmetric, facial light touch sensation equal VIII: hearing grossly normal to voice  X: uvula elevates symmetrically  XI: bilateral shoulder shrug symmetric and strong XII: midline tongue extension without fassiculations Motor:  Normal tone. 5/5 strength of BUE and BLE major muscle groups including strong and equal grip strength and dorsiflexion/plantar flexion Sensory: light touch normal in all  extremities. Cerebellar: normal finger-to-nose with bilateral upper extremities, Romberg sign absent Gait: not accessed      ED Results / Procedures / Treatments   Labs (all labs ordered are listed, but only abnormal results are displayed) Labs Reviewed  CBC - Abnormal; Notable for the following components:      Result Value   RBC 3.71 (*)    Hemoglobin 10.4 (*)    HCT 32.6 (*)    RDW 17.5 (*)    All other components within normal limits  I-STAT CHEM 8, ED - Abnormal; Notable for the following components:   BUN 57 (*)    Creatinine, Ser 3.60 (*)    Glucose, Bld 126 (*)  Hemoglobin 11.2 (*)    HCT 33.0 (*)    All other components within normal limits  I-STAT BETA HCG BLOOD, ED ([facility], [facility], [facility] ONLY) - Abnormal; Notable for the following components:   I-stat hCG, quantitative 6.2 (*)    All other components within normal limits  CBG MONITORING, ED - Abnormal; Notable for the following components:   Glucose-Capillary 125 (*)    All other components within normal limits  RESP PANEL BY RT-PCR (FLU A&B, COVID) ARPGX2  DIFFERENTIAL  ETHANOL  COMPREHENSIVE METABOLIC PANEL  RAPID URINE DRUG SCREEN, HOSP PERFORMED  URINALYSIS, ROUTINE W REFLEX MICROSCOPIC  APTT  PROTIME-INR  LIPID PANEL  HEMOGLOBIN A1C  TYPE AND SCREEN    EKG EKG Interpretation  Date/Time:  Friday January 22 2021 10:25:42 EDT Ventricular Rate:  80 PR Interval:  176 QRS Duration: 104 QT Interval:  411 QTC Calculation: 475 R Axis:   61 Text Interpretation: Sinus rhythm LAE, consider biatrial enlargement LVH with secondary repolarization abnormality Baseline wander in lead(s) V2 Since last tracing QT has shortened Otherwise no significant change Confirmed by Daleen Bo [phone]) on 01/22/2021 10:32:38 AM   Radiology CT HEAD CODE STROKE WO CONTRAST  Result Date: 01/22/2021 CLINICAL DATA:  Code stroke. Neuro facet with acute stroke suspected. EXAM: CT HEAD WITHOUT CONTRAST TECHNIQUE: Contiguous axial images  were obtained from the base of the skull through the vertex without intravenous contrast. COMPARISON:  None. FINDINGS: Brain: High-density in the right ventral pons most consistent with hemorrhage. The density is brighter than would be expected for a hyperdense mass such as cavernoma. Dimensions are 8 mm and there is no detected adjacent brain edema or mass. No detected infarct, hydrocephalus, collection, or cerebral mass. Brain volume is normal. Mild patchy white matter low-density, usually chronic small vessel ischemia given patient history. There is history of hypertension and patient is markedly hypertensive currently Vascular: No hyperdense vessel or unexpected calcification. 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 ASPECTS [facility] Stroke Program Early CT Score) Not scored in this setting IMPRESSION: 1. Subcentimeter acute hemorrhage in the right pons. 2. Chronic white matter disease presumably from patient's hypertension. Electronically Signed   By: Monte Fantasia M.D.   On: 01/22/2021 11:03    Procedures .Critical Care Performed by: Garald Balding, PA-C Authorized by: Garald Balding, PA-C   Critical care provider statement:    Critical care time (minutes):  50   Critical care was necessary to treat or prevent imminent or life-threatening deterioration of the following conditions:  CNS failure or compromise and cardiac failure   Critical care was time spent personally by me on the following activities:  Discussions with consultants, evaluation of patient's response to treatment, examination of patient, ordering and performing treatments and interventions, ordering and review of laboratory studies, ordering and review of radiographic studies,  pulse oximetry, re-evaluation of patient's condition, obtaining history from patient or surrogate and review of old charts     Medications Ordered in ED Medications  clevidipine (CLEVIPREX) infusion 0.5 mg/mL (has no administration in time range)  labetalol (NORMODYNE) injection 10 mg (has no administration in time range)   stroke: mapping our early stages of recovery book (has no administration in time range)  acetaminophen (TYLENOL) tablet 650 mg (has no administration in time range)  Or  acetaminophen (TYLENOL) 160 MG/5ML solution 650 mg (has no administration in time range)    Or  acetaminophen (TYLENOL) suppository 650 mg (has no administration in time range)  senna-docusate (Senokot-S) tablet 1 tablet (has no administration in time range)  pantoprazole (PROTONIX) injection 40 mg (has no administration in time range)  labetalol (NORMODYNE) injection (10 mg Intravenous Given 01/22/21 1112)  clevidipine (CLEVIPREX) infusion 0.5 mg/mL (18 mg/hr Intravenous New Bag/Given 01/22/21 1156)    ED Course  I have reviewed the triage vital signs and the nursing notes.  Pertinent labs & imaging results that were available during my care of the patient were reviewed by me and considered in my medical decision making (see chart for details).  Clinical Course as of 01/22/21 1201  Fri Jan 22, 2021  1053 CT HEAD CODE STROKE WO CONTRAST [EW]    Clinical Course User Index [EW] Daleen Bo, MD   MDM Rules/Calculators/A&P                          54 year old female who presents to the ER with complaints of headache, elevated blood pressures and slurred speech.  Blood pressure to 290/160 on arrival.  Neuro exam with no focal deficits but she is Lucianne Lei positive with intermittent slurred speech.  Soon as the patient was evaluated, code stroke was initiated.  CT of the head with subcentimeter acute hemorrhage in the right pons.  She was started on a Cleviprex drip.  On i-STAT Chem-8 renal function  appears to be at baseline.  Neurology evaluated the patient, will be admitting.   Labs here with a CBC with a hemoglobin of 10.4, improved from prior.  This was a shared visit with my supervising physician Dr. Eulis Foster who independently saw and evaluated the patient & provided guidance in evaluation/management/disposition ,in agreement with care  Final Clinical Impression(s) / ED Diagnoses Final diagnoses:  Hypertensive emergency  Intracranial hemorrhage [facility])    Rx / DC Orders ED Discharge Orders    None       Lyndel Safe 01/22/21 1201    Daleen Bo, MD 01/23/21 [phone]

## 2021-01-22 NOTE — ED Notes (Signed)
Placed a external cath patient is resting with call bell in reach 

## 2021-01-22 NOTE — ED Notes (Signed)
Pt to ICU with SWOT

## 2021-01-22 NOTE — ED Triage Notes (Signed)
Pt arrived to ED via EMS from home. Pt awoke at 0530 this am  With slurred speech, right face numbness and left arm numbness. Sx lasted an hour, pt noted bp to be high, sx have improved but bp per EMS was 260/200 manually. Pt c/o slight headache no deficits noted at this time.

## 2021-01-22 NOTE — Code Documentation (Signed)
Stroke Response Nurse Documentation Code Documentation  Robin Navarro is a 55 y.o. female arriving to [facility] ED via [facility] on 01/22/2021 with past medical hx of HTN. Code stroke was activated by ED after patient arrival. Patient from home where she was LKW at [place] and now complaining of left sided weakness and numbness with slurred speech. Pt was trying to fall asleep at 0530 when she felt a pain in her neck along with headache and some "swelling to her right face." Pt reported that she looked in the mirror and didn't see any change, but she noted her speech sounded funny. The symptoms started to get better, but patient reports her speech never completely resolved. EMS was called and they did not note any abnormality on exam so patient was brought in without calling a Code Stroke. During assessment, EDP and nurse noted a sudden change of speech and weakness to her left arm. BP was taken manually and noted to be 260/200. Code Stroke called.   Stroke team at the bedside on patient activation. Labs drawn and patient cleared for CT by Dr. Eulis Foster. Patient to CT with team. NIHSS 6, see documentation for details and code stroke times. Patient with left facial droop, left arm weakness, left limb ataxia, left decreased sensation and dysarthria  on exam. The following imaging was completed:  CT. Patient is not a candidate for tPA due to having a hemorrhage on CT per MD Bhagat. Care/Plan: q1 mNIHSS/VS/pupils and BP < 160. Bedside handoff with ED RN Peggy.    Kathrin Greathouse  Stroke Response RN

## 2021-01-22 NOTE — ED Notes (Signed)
PTAR paged to activate code stroke  lsn 5:30 def: slurred speech, rt sided facial numbness bp 290/160

## 2021-01-22 NOTE — ED Notes (Signed)
Checked patient blood sugar it was 125 notified RN Peggy patient is resting with call bell in reach

## 2021-01-22 NOTE — ED Notes (Signed)
SWOT with transport pt to ICU

## 2021-01-22 NOTE — ED Provider Notes (Signed)
  Face-to-face evaluation   History: She presents for evaluation of slurred speech, which she noticed at 5 AM when she woke this morning.  She did not take her blood pressure medication this morning.  She presents by private vehicle for evaluation.  She denies having had this problem previously.  She states she is otherwise compliant with her medications.  Review of pharmacy data, with pharmacist indicates patient has not refilled her chlorthalidone or carvedilol, recently.  Physical exam: Middle-aged female, alert, responsive, lucid, dysarthric.  No respiratory distress.  No neglect noted.  Medical screening examination/treatment/procedure(s) were conducted as a shared visit with non-physician practitioner(s) and myself.  I personally evaluated the patient during the encounter    Daleen Bo, MD 01/23/21 [phone]

## 2021-01-22 NOTE — H&P (Signed)
Neurology H&P Reason for Consult: Slurred speech Consulting Provider: Dr. Eulis Foster  CC: Slurred speech  History is obtained from: Patient, Chart Review  HPI: Robin Navarro is a 55 y.o. female with a medical history significant for chronic kidney disease, uncontrolled hypertension, anemia, tobacco and marijuana use who presented to the ED via EMS for evaluation of slurred speech. She states that this morning at 05:30 before going to bed she had a sudden onset of sharp neck pain that radiated upward towards her right eye with a sensation of right facial swelling and slurred speech. She states that the symptoms varied in intensity with the headache subsiding with use of acetaminophen by approximately 07:30 but with persistent slurred speech. She became concerned with the ongoing speech problems and EMS was activated. On arrival, EMS noted a blood pressure of 260 / 200 and transported [name] to the ED where her blood pressure was manually obtained with a reading of 290 / 160. With her persistent slurred speech, a Code Stroke was activated by the EDP for neurology evaluation, she additionally had some mild left-sided weakness.  Of note, [name] endorses that she does have regular outpatient follow up for management of her hypertension and endorses compliance with her antihypertensive medications. She also endorses daily headaches for the last month but states that she often experiences headaches when "coming down" off of marijuana.  She also notes that these headaches tend to be worse at night when she is lying down, and on 07/22/2020 she was admitted for hypertensive emergency with papilledema seen on optometry examination.  She was also admitted on 06/15/2020 with hypertensive emergency (having recently establish care with a primary care physician after having been out of blood pressure meds for over a year due to insurance issues)  LKW: 05:30 tpa given?: No, ICH IR Thrombectomy? No,  ICH Modified Rankin Scale: 0-Completely asymptomatic and back to baseline post- stroke  NIHSS:  1a Level of Conscious.: 0 1b LOC Questions: 0 1c LOC Commands: 0 2 Best Gaze: 0 3 Visual: 0 4 Facial Palsy: 0 5a Motor Arm - left: 1 5b Motor Arm - Right: 0 6a Motor Leg - Left: 1 6b Motor Leg - Right: 0 7 Limb Ataxia: 2 8 Sensory: 1 9 Best Language: 1 10 Dysarthria: 0 11 Extinct. and Inatten.: 0 TOTAL: 6  ICH Score: 1  Time performed: 11:40 AM GCS: 13-15 is 0 points Infratentorial: Yes.  . If yes, 1 point -- 1 Volume: <30cc is 0 points  Age: 55 y.o.. >80 is 1 point -- 0  Intraventricular extension is 1 point  -- 0  Score:1  A Score of 1 points has a 30 day mortality of 13%. Stroke. 2001 Apr;32(4):891-7.  ROS: A complete ROS was performed and is negative except as noted in the HPI.   Past Medical History:  Diagnosis Date  . Anemia   . Chronic kidney disease   . Hypertension    Past Surgical History:  Procedure Laterality Date  . NO PAST SURGERIES     Family History  Problem Relation Age of Onset  . Diabetes Mother   . Hypertension Mother    Social History:  reports that she has been smoking cigars. She has been smoking about 0.01 packs per day. She has never used smokeless tobacco. She reports current alcohol use. She reports current drug use. Drug: Marijuana.  Prior to Admission medications   Medication Sig Start Date End Date Taking? Authorizing Provider  acetaminophen (TYLENOL) 500 MG  tablet Take 500 mg by mouth as needed for mild pain or moderate pain.    [provider]  amLODipine (NORVASC) 10 MG tablet Take 1 tablet (10 mg total) by mouth daily. 07/22/20   Hosie Poisson, MD  carvedilol (COREG) 12.5 MG tablet Take 1 tablet (12.5 mg total) by mouth 2 (two) times daily with a meal. 07/22/20   Hosie Poisson, MD  chlorthalidone (HYGROTON) 25 MG tablet Take 1 tablet (25 mg total) by mouth daily. 07/22/20   Hosie Poisson, MD  hydrALAZINE (APRESOLINE) 50 MG  tablet Take 1 tablet (50 mg total) by mouth 3 (three) times daily. 07/22/20   Hosie Poisson, MD  senna-docusate (SENOKOT-S) 8.6-50 MG tablet TAKE 1 TABLET BY MOUTH AT BEDTIME AS NEEDED FOR MILD CONSTIPATION. 07/22/20 07/22/21  Hosie Poisson, MD   Exam: Current vital signs: BP (!) 242/172 (BP Location: Right Arm)   Pulse 86   Temp 97.8 F (36.6 C) (Oral)   Resp 18   LMP 12/23/2013   SpO2 96%   Physical Exam  Constitutional: Appears well-developed and well-nourished.  Psych: Affect appropriate to situation, slightly anxious on examination Eyes: No scleral injection, normal conjunctivae HENT: No OP obstrucion Head: Normocephalic and atraumatic without obvious abnormality Cardiovascular: Normal rate and regular rhythm Respiratory: Effort normal and non-labored breathing GI: Soft.  No distension. There is no tenderness.  Skin: WDI  Neuro: Mental Status: Patient is awake, alert, oriented to person, place, month, year, and situation. Patient is able to give a clear and coherent history of present illness with some variable details such as timeline of events and the presence of waxing / waning symptoms. Patient's speech is mildly dysarthric but patient is not aphasic. No signs of neglect noted. Cranial Nerves: II: Visual Fields are full. Pupils are equal, round, and reactive to light 3 mm / brisk III,IV, VI: EOMI without ptosis or diplopia.  V: Facial sensation is symmetric to light touch VII: Face appears symmetric resting and smiling with possible subtle left NLF flattening VIII: Hearing is intact to voice X: Palate is midline and palate elevates symmetrically, phonation intact XI: Shoulder shrug is symmetric. XII: Tongue protrudes midline Motor: 5/5 strength present in the right upper and lower extremity. Left upper and lower extremities with subtle weakness 4/5 strength with slight vertical drift on assessment.  Tone and bulk are normal.   Sensory: Sensation is symmetric to  light touch is decreased on the left upper and lower extremities.  Deep Tendon Reflexes: 3+ and symmetric biceps and brachioradialis, 2+ and symmetric patellae.  Cerebellar: FNF and HKS are intact bilaterally on the right with mild ataxia on the left upper and lower extremity Gait: Deferred  I have reviewed labs in epic and the pertinent results are: CBC    Component Value Date/Time   WBC 7.5 07/22/2020 0354   RBC 3.10 (L) 07/22/2020 0354   HGB 11.2 (L) 01/22/2021 1053   HCT 33.0 (L) 01/22/2021 1053   PLT 129 (L) 07/22/2020 0354   MCV 90.6 07/22/2020 0354   MCH 28.1 07/22/2020 0354   MCHC 31.0 07/22/2020 0354   RDW 16.9 (H) 07/22/2020 0354   LYMPHSABS 2.2 06/12/2020 2230   MONOABS 0.7 06/12/2020 2230   EOSABS 0.1 06/12/2020 2230   BASOSABS 0.0 06/12/2020 2230   CMP     Component Value Date/Time   NA 141 01/22/2021 1053   K 3.6 01/22/2021 1053   CL 105 01/22/2021 1053   CO2 23 07/22/2020 0354   GLUCOSE 126 (H)  01/22/2021 1053   BUN 57 (H) 01/22/2021 1053   CREATININE 3.60 (H) 01/22/2021 1053   CALCIUM 9.5 07/22/2020 0354   PROT 7.9 01/06/2014 1645   ALBUMIN 3.1 (L) 07/22/2020 0354   AST 21 01/06/2014 1645   ALT 14 01/06/2014 1645   ALKPHOS 89 01/06/2014 1645   BILITOT 0.3 01/06/2014 1645   GFRNONAA 14 (L) 07/22/2020 0354   GFRAA 23 (L) 06/15/2020 0325   I have reviewed the images obtained: CT Head Code Stroke: IMPRESSION: 1. Subcentimeter acute hemorrhage in the right pons. 2. Chronic white matter disease presumably from patient's hypertension.  Assessment: 55 year old female with chronic, uncontrolled hypertension who presents in hypertensive emergency with slurred speech and transient head pain.  - Stroke risk factors include uncontrolled hypertension, CKD, and tobacco / marijuana use. - Examination reveals patient with headache that has mostly resolved s/p acetaminophen use to a reported 1/10 severity but with persistent dysarthria since onset at 05:30 and mild  left-sided sensory deficits. - Unable to obtain CTA for vessel imaging due to impaired renal function.  - Presentation consistent with hypertensive emergency with acute right pons hemorrhage. With blood pressure elevation and headache, also concerned for PRES/RCVS though less likely given the location of the hemorrhage - Suspect her chronic headache is elevated ICP related in the setting of her uncontrolled hypertension - Plan to admit for further ICH evaluation and blood pressure management. 10 mg IV Labetalol given in CT with identification of hemorrhagic stroke and Cleviprex gtt initiated.   Impression:  Acute right pons hemorrhagic stroke Hypertensive Emergency Possible PRES/RCVS  Recommendations: # Acute right pons hemorrhagic stroke # Hypertensive Emergency - Admit to ICU - Stability CT scan in 6 hours or STAT with any neurological decline - Frequent neuro checks; q42mn for 1 hour, then q1hour - No antiplatelets or anticoagulants due to ISpeed- SCD for DVT prophylaxis, add heparin at 24 hours if stable - Blood pressure control with goal blood pressure < 160 / <100 due to blood pressure 290/160 on ED arrival  - Given the patient's chronic severely uncontrolled hypertension, we are treating with slightly higher than normal blood pressure goal to avoid severe complications from an overly rapid aggressive correction  - Cleverplex and labetalol PRN - Stroke labs, HgbA1c, fasting lipid panel  - Goal LDL < 70; if LDL greater than goal, initiate statin therapy - MRI brain with and without contrast when stabilized to evaluate for underlying mass - MRA head and neck without contrast to evaluate for underlying vascular lesion and complete stroke work-up - Carotid ultrasound if MRA neck is nondiagnostic due to motion artifact - Risk factor modification, diet, exercise, weight loss, nicotine cessation, marijuana cessation, outpatient sleep study for obstructive sleep apnea evaluation -  Echocardiogram - Telemetry monitoring; 30 day event monitor on discharge if no arrythmias captured  - PT consult, OT consult, Speech consult when patient stabilized  - Stroke team to follow  # Chronic Kidney Disease - Gentle hydration - Avoid nephrotoxic agents  # History of Anemia Likely anemia of chronic disease -Monitor -Transfuse for hgb < 7  # Endocrine -goal HgbA1c < 7%  Nutrition E66.9 Obesity   Prophylaxis DVT:  SCDs GI: PPI Bowel: Docusate / Senna  Diet: NPO until cleared by speech  Code Status: Full Code    This patient is critically ill and at significant risk of neurological worsening, death and care requires constant monitoring of vital signs, hemodynamics,respiratory and cardiac monitoring, neurological assessment, discussion with family, other specialists and  medical decision making of high complexity. I spent 40 minutes of neurocritical care time  in the care of  this patient. This was time spent independent of any time provided by nurse practitioner or PA.  Anibal Henderson, AGAC-NP [facility] Pager: [phone]  Attending Neurologist's note:  I personally saw this patient, gathering history, performing a full neurologic examination, reviewing relevant labs, personally reviewing relevant imaging including head CT, and formulated the assessment and plan, adding the note above for completeness and clarity to accurately reflect my thoughts  Lesleigh Noe MD-PhD [facility] [phone]

## 2021-01-23 ENCOUNTER — Inpatient Hospital Stay (HOSPITAL_COMMUNITY): Payer: Medicaid Other

## 2021-01-23 DIAGNOSIS — I6389 Other cerebral infarction: Secondary | ICD-10-CM

## 2021-01-23 LAB — URINALYSIS, ROUTINE W REFLEX MICROSCOPIC
Bilirubin Urine: NEGATIVE
Glucose, UA: NEGATIVE mg/dL
Hgb urine dipstick: NEGATIVE
Ketones, ur: NEGATIVE mg/dL
Leukocytes,Ua: NEGATIVE
Nitrite: NEGATIVE
Protein, ur: 100 mg/dL — AB
Specific Gravity, Urine: 1.013 (ref 1.005–1.030)
pH: 5 (ref 5.0–8.0)

## 2021-01-23 LAB — CBC
HCT: 29.6 % — ABNORMAL LOW (ref 36.0–46.0)
Hemoglobin: 9.8 g/dL — ABNORMAL LOW (ref 12.0–15.0)
MCH: 29.2 pg (ref 26.0–34.0)
MCHC: 33.1 g/dL (ref 30.0–36.0)
MCV: 88.1 fL (ref 80.0–100.0)
Platelets: 183 10*3/uL (ref 150–400)
RBC: 3.36 MIL/uL — ABNORMAL LOW (ref 3.87–5.11)
RDW: 18.1 % — ABNORMAL HIGH (ref 11.5–15.5)
WBC: 8.6 10*3/uL (ref 4.0–10.5)
nRBC: 0 % (ref 0.0–0.2)

## 2021-01-23 LAB — BASIC METABOLIC PANEL
Anion gap: 12 (ref 5–15)
BUN: 43 mg/dL — ABNORMAL HIGH (ref 6–20)
CO2: 23 mmol/L (ref 22–32)
Calcium: 9.2 mg/dL (ref 8.9–10.3)
Chloride: 104 mmol/L (ref 98–111)
Creatinine, Ser: 3.64 mg/dL — ABNORMAL HIGH (ref 0.44–1.00)
GFR, Estimated: 14 mL/min — ABNORMAL LOW (ref >60)
Glucose, Bld: 119 mg/dL — ABNORMAL HIGH (ref 70–99)
Potassium: 2.8 mmol/L — ABNORMAL LOW (ref 3.5–5.1)
Sodium: 139 mmol/L (ref 135–145)

## 2021-01-23 LAB — POTASSIUM: Potassium: 3.6 mmol/L (ref 3.5–5.1)

## 2021-01-23 LAB — ECHOCARDIOGRAM COMPLETE
Area-P 1/2: 2.99 cm2
S' Lateral: 2.7 cm

## 2021-01-23 LAB — RAPID URINE DRUG SCREEN, HOSP PERFORMED
Amphetamines: NOT DETECTED
Barbiturates: NOT DETECTED
Benzodiazepines: NOT DETECTED
Cocaine: NOT DETECTED
Opiates: NOT DETECTED
Tetrahydrocannabinol: NOT DETECTED

## 2021-01-23 LAB — HIV ANTIBODY (ROUTINE TESTING W REFLEX): HIV Screen 4th Generation wRfx: NONREACTIVE

## 2021-01-23 LAB — VITAMIN B12: Vitamin B-12: 259 pg/mL (ref 180–914)

## 2021-01-23 LAB — TSH: TSH: 1.561 u[IU]/mL (ref 0.350–4.500)

## 2021-01-23 MED ORDER — AMLODIPINE BESYLATE 10 MG PO TABS
10.0000 mg | ORAL_TABLET | Freq: Every day | ORAL | Status: DC
  Administered 2021-01-23 – 2021-01-27 (×5): 10 mg via ORAL
  Filled 2021-01-23 (×2): qty 1
  Filled 2021-01-23: qty 2
  Filled 2021-01-23 (×2): qty 1

## 2021-01-23 MED ORDER — CARVEDILOL 12.5 MG PO TABS
12.5000 mg | ORAL_TABLET | Freq: Two times a day (BID) | ORAL | Status: DC
  Administered 2021-01-23 – 2021-01-24 (×2): 12.5 mg via ORAL
  Filled 2021-01-23 (×2): qty 1

## 2021-01-23 MED ORDER — POTASSIUM CHLORIDE 20 MEQ PO PACK
40.0000 meq | PACK | Freq: Two times a day (BID) | ORAL | Status: DC
  Administered 2021-01-23 – 2021-01-25 (×6): 40 meq via ORAL
  Filled 2021-01-23 (×6): qty 2

## 2021-01-23 NOTE — Progress Notes (Signed)
  Echocardiogram 2D Echocardiogram with 3D and strain has been performed.  Darlina Sicilian M 01/23/2021, 10:13 AM

## 2021-01-23 NOTE — Progress Notes (Signed)
STROKE PROGRESS  LISELLE Navarro is an 55 y.o. female.   Assessment/Plan: 1.  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  Goal systolic blood pressure less than 0000000 and diastolic less than [id number].  The patient will be restarted on home medications.  Long-term goals blood pressure 140/85.  Salt restriction, diet and exercise Is discussed with the patient although she tells me she has been working on this.  Additional labs have also been done.  Not on aspirin prior to hospitalization and none at this time based upon intracranial hemorrhage. 2.  Hypertensive emergency resolving 3.  Hypertensive kidney disease 4.  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  She thinks her speech is better.  GENERAL: She is currently sitting and feeding herself.  HEENT: Neck is supple no trauma noted.  ABDOMEN: soft  EXTREMITIES: No edema   BACK: Normal  SKIN: Normal by inspection.    MENTAL STATUS: Alert and oriented. Speech, language and cognition are generally intact. Judgment and insight normal.   CRANIAL NERVES: Pupils are equal, round and reactive to light and accomodation; extra ocular movements are full, there is no significant nystagmus; visual fields are full; upper and lower facial muscles revealed mild weakness of the lower left facial muscle and also mild weakness of the obicularis oculi muscle also on the left side, there is mild flattening of the nasolabial fold on the left side; tongue is midline; uvula is midline; shoulder elevation is normal.  MOTOR: There is mild weakness of the left upper extremity and left leg although direct strength testing is normal.  The other extremities show normal tone, bulk and strength; no pronator drift.  COORDINATION: Left finger to nose is normal, right  finger to nose is normal, No rest tremor; no intention tremor; no postural tremor; no bradykinesia.  SENSATION: Normal to light touch, temperature, and pain.     Objective: Vital signs in last 24 hours: Temp:  [97.7 F (36.5 C)-98.8 F (37.1 C)] 98.4 F (36.9 C) (04/23 0400) Pulse Rate:  [25-93] 82 (04/23 0845) Resp:  [13-31] 23 (04/23 0845) BP: (126-290)/(68-172) 153/85 (04/23 0845) SpO2:  [87 %-100 %] 94 % (04/23 0845)  Intake/Output from previous day: 04/22 0701 - 04/23 0700 In: 727.4 [I.V.:727.4] Out: -  Intake/Output this shift: Total I/O In: 39.4 [I.V.:39.4] Out: -  Nutritional status:  Diet Order            Diet Heart Room service appropriate? Yes with Assist; Fluid consistency: Thin  Diet effective now                  Lab Results: Results for orders placed or performed during the hospital encounter of 01/22/21 (from the past 48 hour(s))  CBG monitoring, ED     Status: Abnormal   Collection Time: 01/22/21 10:40 AM  Result Value Ref Range   Glucose-Capillary 125 (H) 70 - 99 mg/dL    Comment: Glucose reference range applies only to samples taken after fasting for at least 8 hours.  CBC     Status: Abnormal   Collection Time: 01/22/21 10:42 AM  Result Value Ref Range   WBC 7.8 4.0 - 10.5 K/uL   RBC 3.71 (L) 3.87 - 5.11 MIL/uL   Hemoglobin 10.4 (L) 12.0 - 15.0 g/dL   HCT 32.6 (L) 36.0 - 46.0 %  MCV 87.9 80.0 - 100.0 fL   MCH 28.0 26.0 - 34.0 pg   MCHC 31.9 30.0 - 36.0 g/dL   RDW 17.5 (H) 11.5 - 15.5 %   Platelets 174 150 - 400 K/uL   nRBC 0.0 0.0 - 0.2 %    Comment: Performed at [facility] [address], [place], [place] [address]  Differential     Status: None   Collection Time: 01/22/21 10:42 AM  Result Value Ref Range   Neutrophils Relative % 66 %   Neutro Abs 5.1 1.7 - 7.7 K/uL   Lymphocytes Relative 27 %   Lymphs Abs 2.1 0.7 - 4.0 K/uL   Monocytes Relative 5 %   Monocytes Absolute 0.4 0.1 - 1.0 K/uL   Eosinophils Relative 1 %    Eosinophils Absolute 0.1 0.0 - 0.5 K/uL   Basophils Relative 0 %   Basophils Absolute 0.0 0.0 - 0.1 K/uL   Immature Granulocytes 1 %   Abs Immature Granulocytes 0.06 0.00 - 0.07 K/uL    Comment: Performed at [facility] [address] [place], [place] [address]  Comprehensive metabolic panel     Status: Abnormal   Collection Time: 01/22/21 10:42 AM  Result Value Ref Range   Sodium 138 135 - 145 mmol/L   Potassium 3.5 3.5 - 5.1 mmol/L   Chloride 103 98 - 111 mmol/L   CO2 25 22 - 32 mmol/L   Glucose, Bld 121 (H) 70 - 99 mg/dL    Comment: Glucose reference range applies only to samples taken after fasting for at least 8 hours.   BUN 46 (H) 6 - 20 mg/dL   Creatinine, Ser 3.52 (H) 0.44 - 1.00 mg/dL   Calcium 9.6 8.9 - 10.3 mg/dL   Total Protein 7.6 6.5 - 8.1 g/dL   Albumin 3.8 3.5 - 5.0 g/dL   AST 30 15 - 41 U/L   ALT 16 0 - 44 U/L   Alkaline Phosphatase 77 38 - 126 U/L   Total Bilirubin 0.7 0.3 - 1.2 mg/dL   GFR, Estimated 15 (L) >60 mL/min    Comment: (NOTE) Calculated using the CKD-EPI Creatinine Equation (2021)    Anion gap 10 5 - 15    Comment: Performed at [facility] [address] [place], [place] [address]  ABO/Rh     Status: None   Collection Time: 01/22/21 10:42 AM  Result Value Ref Range   ABO/RH(D)      O POS Performed at [facility] [address] [place], [place] [address]   I-Stat beta hCG blood, ED     Status: Abnormal   Collection Time: 01/22/21 10:50 AM  Result Value Ref Range   I-stat hCG, quantitative 6.2 (H) <5 mIU/mL   Comment 3            Comment:   GEST. AGE      CONC.  (mIU/mL)   <=1 WEEK        5 - 50     2 WEEKS       50 - 500     3 WEEKS       100 - 10,000     4 WEEKS     1,000 - 30,000        FEMALE AND NON-PREGNANT FEMALE:     LESS THAN 5 mIU/mL   I-stat chem 8, ED     Status: Abnormal   Collection Time: 01/22/21 10:53  AM  Result Value Ref Range   Sodium 141 135 - 145 mmol/L   Potassium 3.6 3.5 - 5.1 mmol/L    Chloride 105 98 - 111 mmol/L   BUN 57 (H) 6 - 20 mg/dL   Creatinine, Ser 3.60 (H) 0.44 - 1.00 mg/dL   Glucose, Bld 126 (H) 70 - 99 mg/dL    Comment: Glucose reference range applies only to samples taken after fasting for at least 8 hours.   Calcium, Ion 1.21 1.15 - 1.40 mmol/L   TCO2 29 22 - 32 mmol/L   Hemoglobin 11.2 (L) 12.0 - 15.0 g/dL   HCT 33.0 (L) 36.0 - 46.0 %  Resp Panel by RT-PCR (Flu A&B, Covid) Nasopharyngeal Swab     Status: None   Collection Time: 01/22/21 12:43 PM   Specimen: Nasopharyngeal Swab; Nasopharyngeal(NP) swabs in vial transport medium  Result Value Ref Range   SARS Coronavirus 2 by RT PCR NEGATIVE NEGATIVE    Comment: (NOTE) SARS-CoV-2 target nucleic acids are NOT DETECTED.  The SARS-CoV-2 RNA is generally detectable in upper respiratory specimens during the acute phase of infection. The lowest concentration of SARS-CoV-2 viral copies this assay can detect is 138 copies/mL. A negative result does not preclude SARS-Cov-2 infection and should not be used as the sole basis for treatment or other patient management decisions. A negative result may occur with  improper specimen collection/handling, submission of specimen other than nasopharyngeal swab, presence of viral mutation(s) within the areas targeted by this assay, and inadequate number of viral copies(<138 copies/mL). A negative result must be combined with clinical observations, patient history, and epidemiological information. The expected result is Negative.  Fact Sheet for Patients:  EntrepreneurPulse.com.au  Fact Sheet for Healthcare Providers:  IncredibleEmployment.be  This test is no t yet approved or cleared by the [place] FDA and  has been authorized for detection and/or diagnosis of SARS-CoV-2 by FDA under an Emergency Use Authorization (EUA). This EUA will remain  in effect (meaning this test can be used) for the duration of the COVID-19  declaration under Section 564(b)(1) of the Act, 21 U.S.C.section 360bbb-3(b)(1), unless the authorization is terminated  or revoked sooner.       Influenza A by PCR NEGATIVE NEGATIVE   Influenza B by PCR NEGATIVE NEGATIVE    Comment: (NOTE) The Xpert Xpress SARS-CoV-2/FLU/RSV plus assay is intended as an aid in the diagnosis of influenza from Nasopharyngeal swab specimens and should not be used as a sole basis for treatment. Nasal washings and aspirates are unacceptable for Xpert Xpress SARS-CoV-2/FLU/RSV testing.  Fact Sheet for Patients: EntrepreneurPulse.com.au  Fact Sheet for Healthcare Providers: IncredibleEmployment.be  This test is not yet approved or cleared by the [place] FDA and has been authorized for detection and/or diagnosis of SARS-CoV-2 by FDA under an Emergency Use Authorization (EUA). This EUA will remain in effect (meaning this test can be used) for the duration of the COVID-19 declaration under Section 564(b)(1) of the Act, 21 U.S.C. section 360bbb-3(b)(1), unless the authorization is terminated or revoked.  Performed at [facility], [place] [address] [place], [place] [address]   Ethanol     Status: None   Collection Time: 01/22/21 12:56 PM  Result Value Ref Range   Alcohol, Ethyl (B) <10 <10 mg/dL    Comment: (NOTE) Lowest detectable limit for serum alcohol is 10 mg/dL.  For medical purposes only. Performed at [facility], [place] [address], [place], [place] [address]   MRSA PCR Screening  Status: None   Collection Time: 01/22/21  2:15 PM   Specimen: Nasopharyngeal  Result Value Ref Range   MRSA by PCR NEGATIVE NEGATIVE    Comment:        The GeneXpert MRSA Assay (FDA approved for NASAL specimens only), is one component of a comprehensive MRSA colonization surveillance program. It is not intended to diagnose MRSA infection nor to guide or monitor treatment for MRSA infections. Performed  at [facility], [place] [address], [place], [place] [address]   APTT     Status: None   Collection Time: 01/22/21  5:06 PM  Result Value Ref Range   aPTT 31 24 - 36 seconds    Comment: Performed at [facility] [address] [place], [place] [address]  Protime-INR     Status: None   Collection Time: 01/22/21  5:06 PM  Result Value Ref Range   Prothrombin Time 13.6 11.4 - 15.2 seconds   INR 1.0 0.8 - 1.2    Comment: (NOTE) INR goal varies based on device and disease states. Performed at [facility], [place] [address], [place], [place] [address]   Lipid panel     Status: Abnormal   Collection Time: 01/22/21  5:06 PM  Result Value Ref Range   Cholesterol 175 0 - 200 mg/dL   Triglycerides 288 (H) <150 mg/dL   HDL 48 >40 mg/dL   Total CHOL/HDL Ratio 3.6 RATIO   VLDL 58 (H) 0 - 40 mg/dL   LDL Cholesterol 69 0 - 99 mg/dL    Comment:        Total Cholesterol/HDL:CHD Risk Coronary Heart Disease Risk Table                     Men   Women  1/2 Average Risk   3.4   3.3  Average Risk       5.0   4.4  2 X Average Risk   9.6   7.1  3 X Average Risk  23.4   11.0        Use the calculated Patient Ratio above and the CHD Risk Table to determine the patient's CHD Risk.        ATP III CLASSIFICATION (LDL):  <100     mg/dL   Optimal  100-129  mg/dL   Near or Above                    Optimal  130-159  mg/dL   Borderline  160-189  mg/dL   High  >190     mg/dL   Very High Performed at [facility] [address] [place], [place] [address]   Hemoglobin A1c     Status: None   Collection Time: 01/22/21  5:06 PM  Result Value Ref Range   Hgb A1c MFr Bld 5.3 4.8 - 5.6 %    Comment: (NOTE) Pre diabetes:          5.7%-6.4%  Diabetes:              >6.4%  Glycemic control for   <7.0% adults with diabetes    Mean Plasma Glucose 105.41 mg/dL    Comment: Performed at [facility] [address], [place], [place] [address]  Type and screen [place]     Status: None   Collection Time: 01/22/21  5:15 PM  Result Value Ref Range   ABO/RH(D)  O POS    Antibody Screen NEG    Sample Expiration      01/25/2021,2359 Performed at [facility], [place] [address], [place], [place] [address]   CBC     Status: Abnormal   Collection Time: 01/23/21 12:43 AM  Result Value Ref Range   WBC 8.6 4.0 - 10.5 K/uL   RBC 3.36 (L) 3.87 - 5.11 MIL/uL   Hemoglobin 9.8 (L) 12.0 - 15.0 g/dL   HCT 29.6 (L) 36.0 - 46.0 %   MCV 88.1 80.0 - 100.0 fL   MCH 29.2 26.0 - 34.0 pg   MCHC 33.1 30.0 - 36.0 g/dL   RDW 18.1 (H) 11.5 - 15.5 %   Platelets 183 150 - 400 K/uL   nRBC 0.0 0.0 - 0.2 %    Comment: Performed at [facility], [place] [address], [place], [place] [id number]  Basic metabolic panel     Status: Abnormal   Collection Time: 01/23/21 12:43 AM  Result Value Ref Range   Sodium 139 135 - 145 mmol/L   Potassium 2.8 (L) 3.5 - 5.1 mmol/L   Chloride 104 98 - 111 mmol/L   CO2 23 22 - 32 mmol/L   Glucose, Bld 119 (H) 70 - 99 mg/dL    Comment: Glucose reference range applies only to samples taken after fasting for at least 8 hours.   BUN 43 (H) 6 - 20 mg/dL   Creatinine, Ser 3.64 (H) 0.44 - 1.00 mg/dL   Calcium 9.2 8.9 - 10.3 mg/dL   GFR, Estimated 14 (L) >60 mL/min    Comment: (NOTE) Calculated using the CKD-EPI Creatinine Equation (2021)    Anion gap 12 5 - 15    Comment: Performed at [facility] [address] [place], [place] [address]    Lipid Panel Recent Labs    01/22/21 1706  CHOL 175  TRIG 288*  HDL 48  CHOLHDL 3.6  VLDL 58*  LDLCALC 69    Studies/Results:  BRAIN MRI/MRA  IMPRESSION: 1. Acute 8 mm hemorrhage in the right pons, likely similar in size when comparing across modalities. Associated edema throughout the surrounding pons. No obvious acute infarct, mass lesion, or vascular malformation although acute blood products and the absence of contrast limits evaluation. Repeat imaging after resolution of  the hemorrhage could further evaluate if clinically indicated. 2. Age-advanced moderate chronic microvascular ischemic disease. 3. Findings suggestive multiple prior infratentorial and supratentorial microhemorrhages, described above and most likely hypertensive in etiology. 4. No large vessel occlusion or proximal hemodynamically significant stenosis intracranially.    NECK MRA IMPRESSION: 1. No evidence of significant stenosis on this motion limited MRA. 2. Tortuous vessels in the neck, likely secondary to the patient's known hypertension.    TTE 1. Left ventricular ejection fraction, by estimation, is 60 to 65%. The  left ventricle has normal function. The left ventricle has no regional  wall motion abnormalities. There is moderate concentric left ventricular  hypertrophy. Left ventricular  diastolic parameters are indeterminate. Elevated left ventricular  end-diastolic pressure. Global longitudinal strain performed but not  reported based on interpreter judgement due to suboptimal tracking.  2. Right ventricular systolic function is normal. The right ventricular  size is normal. Tricuspid regurgitation signal is inadequate for assessing  PA pressure.  3. The mitral valve is normal in structure. No evidence of mitral valve  regurgitation. No evidence of mitral stenosis.  4. The aortic valve is tricuspid. Aortic valve regurgitation is not  visualized. Mild aortic valve sclerosis is present, with no evidence of  aortic valve stenosis.  5. The inferior vena cava is normal in size with <50% respiratory  variability, suggesting right atrial pressure of 8 mmHg.       Medications:  Scheduled Meds: . Chlorhexidine Gluconate Cloth  6 each Topical Daily  . pantoprazole (PROTONIX) IV  40 mg Intravenous QHS  . potassium chloride  40 mEq Oral BID  . senna-docusate  1 tablet Oral BID   Continuous Infusions: . clevidipine 21 mg/hr (01/23/21 0800)   PRN Meds:.acetaminophen  **OR** acetaminophen (TYLENOL) oral liquid 160 mg/5 mL **OR** acetaminophen, labetalol    This patient is critically ill due to stroke/bleed and at significant risk of neurological worsening, death form severe anemia, bleeding, recurrent stroke, intracranial stenosis. This patient's care requires constant monitoring of vital signs, hemodynamics, respiratory and cardiac monitoring, review of multiple databases, neurological assessment, other specialists and medical decision making of high complexity. I spent 35 minutes of neurocritical care time in the care of this patient    LOS: 1 day   Alastair Hennes A. Merlene Laughter, M.D.  Diplomate, Tax adviser of Psychiatry and Neurology ( Neurology).

## 2021-01-23 NOTE — Progress Notes (Signed)
PT Cancellation Note  Patient Details Name: Robin Navarro MRN: [id number] DOB: 1965-12-27   Cancelled Treatment:    Reason Eval/Treat Not Completed: Active bedrest order   Wyona Almas, PT, DPT Acute Rehabilitation Services Pager [phone] Office [phone]    Deno Etienne 01/23/2021, 7:58 AM

## 2021-01-23 NOTE — Progress Notes (Signed)
OT Cancellation Note  Patient Details Name: Robin Navarro MRN: [id number] DOB: [birth year]   Cancelled Treatment:    Reason Eval/Treat Not Completed: Active bedrest order.  [name], OTR/L Acute Rehab Services Pager [phone] Office [phone]     Almon Register 01/23/2021, 6:37 AM

## 2021-01-23 NOTE — Evaluation (Addendum)
Physical Therapy Evaluation Patient Details Name: Robin Navarro MRN: [id number] DOB: 1966/03/12 Today's Date: 01/23/2021   History of Present Illness  Pt is a 55 y.o. F who presents with slurred speech and mild left sided weakness. 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 Significant PMH: CKD, uncontrolled HTN, anemia, tobacco and marijuana use.  Clinical Impression  Prior to admission, pt lives in an apartment with her sister and niece and works in Therap[employer], art. Pt presents with mildly slurred speech, decreased LUE fine motor coordination, decreased gait speed and endurance. Pt ambulating x 250 feet with no assistive device or physical assist. BP 165/96 post mobility. Encouraged increased use of left hand to promote neuro recovery. Suspect pt will progress well; will continue to follow acutely.     Follow Up Recommendations Outpatient PT;Supervision - Intermittent    Equipment Recommendations  None recommended by PT    Recommendations for Other Services       Precautions / Restrictions Precautions Precautions: Other (comment) Precaution Comments: BP <160/ < 100 Restrictions Weight Bearing Restrictions: No      Mobility  Bed Mobility               General bed mobility comments: OOB in chair    Transfers Overall transfer level: Needs assistance Equipment used: None Transfers: Sit to/from Stand Sit to Stand: Supervision            Ambulation/Gait Ambulation/Gait assistance: Supervision Gait Distance (Feet): 250 Feet Assistive device: None Gait Pattern/deviations: Step-through pattern;Decreased stride length Gait velocity: decreased   General Gait Details: Supervision for safety, no gross imbalance with negotiating level surfaces and performing head turns  Stairs            Wheelchair Mobility    Modified Rankin (Stroke Patients Only) Modified Rankin (Stroke Patients Only) Pre-Morbid Rankin Score: No symptoms Modified Rankin:  Moderately severe disability     Balance Overall balance assessment: Mild deficits observed, not formally tested                                           Pertinent Vitals/Pain Pain Assessment: No/denies pain    Home Living Family/patient expects to be discharged to:: Private residence Living Arrangements: Other relatives (sister, niece) Available Help at Discharge: Family Type of Home: Apartment (townhouse) Home Access: Level entry     Home Layout: Two level        Prior Function Level of Independence: Independent         Comments: Works in [place]: Right    Extremity/Trunk Assessment   Upper Extremity Assessment Upper Extremity Assessment: Defer to OT evaluation    Lower Extremity Assessment Lower Extremity Assessment: RLE deficits/detail;LLE deficits/detail RLE Deficits / Details: Strength 5/5 LLE Deficits / Details: Strength 5/5    Cervical / Trunk Assessment Cervical / Trunk Assessment: Normal  Communication   Communication: Other (comment) (slightly slurred speech)  Cognition Arousal/Alertness: Awake/alert Behavior During Therapy: Flat affect Overall Cognitive Status: Within Functional Limits for tasks assessed                                        General Comments      Exercises     Assessment/Plan  PT Assessment Patient needs continued PT services  PT Problem List Decreased strength;Decreased balance;Decreased activity tolerance;Decreased mobility;Decreased coordination       PT Treatment Interventions Gait training;Stair training;Functional mobility training;Therapeutic activities;Therapeutic exercise;Balance training;Patient/family education    PT Goals (Current goals can be found in the Care Plan section)  Acute Rehab PT Goals Patient Stated Goal: return to baseline PT Goal Formulation: With patient Time For Goal Achievement: 02/06/21 Potential to  Achieve Goals: Good    Frequency Min 4X/week   Barriers to discharge        Co-evaluation               AM-PAC PT "6 Clicks" Mobility  Outcome Measure Help needed turning from your back to your side while in a flat bed without using bedrails?: None Help needed moving from lying on your back to sitting on the side of a flat bed without using bedrails?: None Help needed moving to and from a bed to a chair (including a wheelchair)?: A Little Help needed standing up from a chair using your arms (e.g., wheelchair or bedside chair)?: A Little Help needed to walk in hospital room?: A Little Help needed climbing 3-5 steps with a railing? : A Little 6 Click Score: 20    End of Session   Activity Tolerance: Patient tolerated treatment well Patient left: in chair;with call bell/phone within reach Nurse Communication: Mobility status PT Visit Diagnosis: Unsteadiness on feet (R26.81)    Time: [id number] PT Time Calculation (min) (ACUTE ONLY): 20 min   Charges:   PT Evaluation $PT Eval Moderate Complexity: 1 Mod          .Wilhemina Cash   Deno Etienne 01/23/2021, 11:03 AM

## 2021-01-23 NOTE — Plan of Care (Signed)
  Problem: Education: Goal: Knowledge of disease or condition will improve Outcome: Progressing   Problem: Education: Goal: Knowledge of secondary prevention will improve Outcome: Progressing   Problem: Education: Goal: Knowledge of patient specific risk factors addressed and post discharge goals established will improve Outcome: Progressing   Problem: Intracerebral Hemorrhage Tissue Perfusion: Goal: Complications of Intracerebral Hemorrhage will be minimized Outcome: Progressing

## 2021-01-23 NOTE — Evaluation (Signed)
Occupational Therapy Evaluation Patient Details Name: Robin Navarro MRN: [id number] DOB: [birth year] Today's Date: 01/23/2021    History of Present Illness Pt is a 55 y.o. F who presents with slurred speech and mild left sided weakness. 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 Significant PMH: CKD, uncontrolled HTN, anemia, tobacco and marijuana use.   Clinical Impression   This 55 yo female admitted with above presents to acute OT with decreased coordination and strength in LUE thus affecting her efficiency with her basic ADLs, she will continue to benefit from acute OT with follow up OPOT.    Follow Up Recommendations  Outpatient OT    Equipment Recommendations  None recommended by OT       Precautions / Restrictions Precautions Precautions: Other (comment) Precaution Comments: BP <160/ < 100 Restrictions Weight Bearing Restrictions: No      Mobility Bed Mobility               General bed mobility comments: OOB in chair    Transfers Overall transfer level: Needs assistance Equipment used: None Transfers: Sit to/from Stand Sit to Stand: Supervision              Balance Overall balance assessment: Mild deficits observed, not formally tested                                         ADL either performed or assessed with clinical judgement   ADL Overall ADL's : Needs assistance/impaired Eating/Feeding: Modified independent;Sitting Eating/Feeding Details (indicate cue type and reason): increased time for setup due to decreased use do LUE Grooming: Supervision/safety;Set up;Standing Grooming Details (indicate cue type and reason): Has to focus on using LUE Upper Body Bathing: Set up;Supervision/ safety;Sitting Upper Body Bathing Details (indicate cue type and reason): Has to focus on using LUE Lower Body Bathing: Supervison/ safety;Set up;Sit to/from stand Lower Body Bathing Details (indicate cue type and reason): Has to focus  on using LUE Upper Body Dressing : Supervision/safety;Set up;Sitting Upper Body Dressing Details (indicate cue type and reason): Has to focus on using LUE Lower Body Dressing: Supervision/safety;Set up;Sit to/from stand Lower Body Dressing Details (indicate cue type and reason): Has to focus on using LUE Toilet Transfer: Supervision/safety;Ambulation   Toileting- Clothing Manipulation and Hygiene: Supervision/safety;Sit to/from stand               Vision Patient Visual Report: No change from baseline              Pertinent Vitals/Pain Pain Assessment: No/denies pain     Hand Dominance Right   Extremity/Trunk Assessment Upper Extremity Assessment Upper Extremity Assessment: LUE deficits/detail LUE Deficits / Details: Lag with shoulder flexion compared to RUE; lowers from equal position wtih RUE when eyes are closed, decreased coordination LUE Sensation: decreased proprioception LUE Coordination: decreased fine motor;decreased gross motor     Communication Communication Communication: Other (comment) (slightly slurred speech)   Cognition Arousal/Alertness: Awake/alert Behavior During Therapy: Flat affect Overall Cognitive Status: Within Functional Limits for tasks assessed                                                Home Living Family/patient expects to be discharged to:: Private residence Living Arrangements: Other relatives (sister, niece) Available Help  at Discharge: Family Type of Home: Apartment (townhouse) Home Access: Level entry     Home Layout: Two level Alternate Level Stairs-Number of Steps:  (flight to main floor)   Bathroom Shower/Tub: Tub/shower unit                    Prior Functioning/Environment Level of Independence: Independent        Comments: Works in Industrial/product designer Problem List: Decreased range of motion;Impaired UE functional use      OT Treatment/Interventions: Self-care/ADL  training;Therapeutic exercise;Therapeutic activities    OT Goals(Current goals can be found in the care plan section) Acute Rehab OT Goals Patient Stated Goal: to get back to normal OT Goal Formulation: With patient Time For Goal Achievement: 02/06/21 Potential to Achieve Goals: Good  OT Frequency: Min 2X/week              AM-PAC OT "6 Clicks" Daily Activity     Outcome Measure Help from another person eating meals?: None Help from another person taking care of personal grooming?: A Little Help from another person toileting, which includes using toliet, bedpan, or urinal?: A Little Help from another person bathing (including washing, rinsing, drying)?: A Little Help from another person to put on and taking off regular upper body clothing?: A Little Help from another person to put on and taking off regular lower body clothing?: A Little 6 Click Score: 19   End of Session    Activity Tolerance: Patient tolerated treatment well Patient left: in chair;with call bell/phone within reach  OT Visit Diagnosis: Hemiplegia and hemiparesis Hemiplegia - Right/Left: Left Hemiplegia - dominant/non-dominant: Non-Dominant Hemiplegia - caused by: Nontraumatic intracerebral hemorrhage                Time: [id number] OT Time Calculation (min): 16 min Charges:  OT General Charges $OT Visit: 1 Visit OT Evaluation $OT Eval Moderate Complexity: 1 Mod  [name], OTR/L Acute [employer] Pager [phone] Office [phone]    Almon Register 01/23/2021, 1:36 PM

## 2021-01-24 LAB — CBC
HCT: 28.5 % — ABNORMAL LOW (ref 36.0–46.0)
Hemoglobin: 9.2 g/dL — ABNORMAL LOW (ref 12.0–15.0)
MCH: 28.8 pg (ref 26.0–34.0)
MCHC: 32.3 g/dL (ref 30.0–36.0)
MCV: 89.1 fL (ref 80.0–100.0)
Platelets: 212 10*3/uL (ref 150–400)
RBC: 3.2 MIL/uL — ABNORMAL LOW (ref 3.87–5.11)
RDW: 18.3 % — ABNORMAL HIGH (ref 11.5–15.5)
WBC: 8.3 10*3/uL (ref 4.0–10.5)
nRBC: 0 % (ref 0.0–0.2)

## 2021-01-24 LAB — BASIC METABOLIC PANEL
Anion gap: 9 (ref 5–15)
BUN: 38 mg/dL — ABNORMAL HIGH (ref 6–20)
CO2: 23 mmol/L (ref 22–32)
Calcium: 9.5 mg/dL (ref 8.9–10.3)
Chloride: 106 mmol/L (ref 98–111)
Creatinine, Ser: 3.5 mg/dL — ABNORMAL HIGH (ref 0.44–1.00)
GFR, Estimated: 15 mL/min — ABNORMAL LOW (ref >60)
Glucose, Bld: 118 mg/dL — ABNORMAL HIGH (ref 70–99)
Potassium: 3.5 mmol/L (ref 3.5–5.1)
Sodium: 138 mmol/L (ref 135–145)

## 2021-01-24 LAB — PHOSPHORUS: Phosphorus: 3.9 mg/dL (ref 2.5–4.6)

## 2021-01-24 LAB — MAGNESIUM: Magnesium: 2.2 mg/dL (ref 1.7–2.4)

## 2021-01-24 LAB — TRIGLYCERIDES: Triglycerides: 162 mg/dL — ABNORMAL HIGH (ref <150)

## 2021-01-24 LAB — RPR: RPR Ser Ql: NONREACTIVE

## 2021-01-24 MED ORDER — CARVEDILOL 12.5 MG PO TABS
12.5000 mg | ORAL_TABLET | Freq: Once | ORAL | Status: AC
  Administered 2021-01-24: 12.5 mg via ORAL

## 2021-01-24 MED ORDER — HYDRALAZINE HCL 25 MG PO TABS
25.0000 mg | ORAL_TABLET | Freq: Three times a day (TID) | ORAL | Status: DC
  Administered 2021-01-24 – 2021-01-25 (×3): 25 mg via ORAL
  Filled 2021-01-24 (×3): qty 1

## 2021-01-24 MED ORDER — GUAIFENESIN 100 MG/5ML PO SOLN
10.0000 mL | ORAL | Status: DC | PRN
  Administered 2021-01-24 – 2021-01-25 (×2): 200 mg via ORAL
  Filled 2021-01-24 (×2): qty 15

## 2021-01-24 MED ORDER — CARVEDILOL 12.5 MG PO TABS
25.0000 mg | ORAL_TABLET | Freq: Two times a day (BID) | ORAL | Status: DC
  Administered 2021-01-24 – 2021-01-26 (×4): 25 mg via ORAL
  Filled 2021-01-24 (×4): qty 2

## 2021-01-24 NOTE — Evaluation (Signed)
Speech Language Pathology Evaluation Patient Details Name: Robin Navarro MRN: [id number] DOB: September 22, 1966 Today's Date: 01/24/2021 Time: [id number] SLP Time Calculation (min) (ACUTE ONLY): 15 min  Problem List:  Patient Active Problem List   Diagnosis Date Noted  . Stroke due to intracerebral hemorrhage ([place]) 01/22/2021  . Hypokalemia 07/19/2020  . Elevated troponin 07/19/2020  . Abnormal EKG 07/19/2020  . Visual disturbance 07/19/2020  . Hypertensive emergency 07/19/2020  . AKI (acute kidney injury) ([place]) 06/13/2020  . Normocytic anemia 06/13/2020  . Acute kidney injury ([place]) 06/13/2020  . Acute bronchitis 02/11/2013  . Community acquired pneumonia 02/10/2013  . HTN (hypertension) 02/10/2013   Past Medical History:  Past Medical History:  Diagnosis Date  . Anemia   . Chronic kidney disease   . Hypertension    Past Surgical History:  Past Surgical History:  Procedure Laterality Date  . NO PAST SURGERIES     HPI:  History of Present Illness Pt is a 55 y.o. F who presents with slurred speech and mild left sided weakness. 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 Significant PMH: CKD, uncontrolled HTN, anemia, tobacco and marijuana use.   Assessment / Plan / Recommendation Clinical Impression  Patient presents with a mild dysarthria (question ataxic dysarthria) which impacts her overall speech quality but with very minimal impact on overall speech intelligiblity. Patient demonstrated coordination difficulties and disrupted fluency with repeated lingual phoneme production (tuh tuh tuh, [place], etc). During conversation and oral reading of short story, patient's speech rate apeared to be slow and she did confirm that she was not able to speak at her usual rate. 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 SLP advised patient that if her speech improves prior to SLP OP eval, she could always cancel.    SLP Assessment  SLP Recommendation/Assessment: All further Speech Lanaguage Pathology  needs can be addressed in the next venue of care SLP Visit Diagnosis: Dysarthria and anarthria (R47.1)    Follow Up Recommendations  Outpatient SLP    Frequency and Duration   N/A        SLP Evaluation Cognition  Overall Cognitive Status: Within Functional Limits for tasks assessed       Comprehension  Auditory Comprehension Overall Auditory Comprehension: Appears within functional limits for tasks assessed    Expression Expression Primary Mode of Expression: Verbal Verbal Expression Overall Verbal Expression: Appears within functional limits for tasks assessed   Oral / Motor  Oral Motor/Sensory Function Overall Oral Motor/Sensory Function: Mild impairment Facial ROM: Within Functional Limits Facial Symmetry: Within Functional Limits Facial Strength: Within Functional Limits Facial Sensation: Within Functional Limits Lingual ROM: Within Functional Limits Lingual Symmetry: Within Functional Limits Lingual Strength: Reduced Velum: Within Functional Limits Mandible: Within Functional Limits Motor Speech Overall Motor Speech: Impaired Respiration: Within functional limits Phonation: Normal Articulation: Impaired Level of Impairment: Conversation Intelligibility: Intelligibility reduced Word: 75-100% accurate Phrase: 75-100% accurate Sentence: 75-100% accurate Conversation: 75-100% accurate Motor Planning: Witnin functional limits Motor Speech Errors: Not applicable Effective Techniques: Slow rate   GO  Sonia Baller, MA, CCC-SLP Speech  Therapy [facility] Acute Rehab

## 2021-01-24 NOTE — Progress Notes (Signed)
STROKE PROGRESS  Robin Navarro is an 55 y.o. female.   Assessment/Plan: 1.  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  Goal systolic blood pressure less than 0000000 and diastolic less than [id number].  The patient will be restarted on home medications.  Long-term goals blood pressure 140/85.  Salt restriction, diet and exercise Is discussed with the patient although she tells me she has been working on this.  Additional labs have also been done.  Not on aspirin prior to hospitalization and none at this time based upon intracranial hemorrhage.  The dose of Coreg will be increased.  Hydralazine will be restarted at half the dose preadmission dose.  Continue with Norvasc. 2.  Hypertensive emergency resolving 3.  Hypertensive kidney disease 4.  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  She thinks her speech is better.  Blood pressure still remains high despite initiation of Norvasc.  She has not been able to be weaned off IV drips.  GENERAL: She is currently sitting and feeding herself.  HEENT: Neck is supple no trauma noted.  EXTREMITIES: No edema   BACK: Normal  SKIN: Normal by inspection.    MENTAL STATUS: Alert and oriented. Speech, language and cognition are generally intact. Judgment and insight normal.   CRANIAL NERVES: Pupils are equal, round and reactive to light and accomodation; extra ocular movements are full, there is no significant nystagmus; visual fields are full; upper and lower facial muscles revealed mild weakness of the lower left facial muscle and also mild weakness of the obicularis oculi muscle also on the left side, there is mild flattening of the nasolabial fold on the left side; tongue is midline; uvula is midline; shoulder elevation is normal.  MOTOR: There is  mild weakness of the left upper extremity and left leg although direct strength testing is normal.  The other extremities show normal tone, bulk and strength; no pronator drift.  COORDINATION: Left finger to nose is normal, right finger to nose is normal, No rest tremor; no intention tremor; no postural tremor; no bradykinesia.  SENSATION: Normal to light touch, temperature, and pain.     Objective: Vital signs in last 24 hours: Temp:  [97.9 F (36.6 C)-98.6 F (37 C)] 98.4 F (36.9 C) (04/24 0800) Pulse Rate:  [64-110] 76 (04/24 0815) Resp:  [14-32] 19 (04/24 0815) BP: (118-168)/(64-107) 130/87 (04/24 0815) SpO2:  [80 %-100 %] 92 % (04/24 0815)  Intake/Output from previous day: 04/23 0701 - 04/24 0700 In: 813.4 [I.V.:813.4] Out: -  Intake/Output this shift: Total I/O In: 32.6 [I.V.:32.6] Out: -  Nutritional status:  Diet Order            Diet Heart Room service appropriate? Yes with Assist; Fluid consistency: Thin  Diet effective now                  Lab Results: Results for orders placed or performed during the hospital encounter of 01/22/21 (from the past 48 hour(s))  CBG monitoring, ED     Status: Abnormal   Collection Time: 01/22/21 10:40 AM  Result Value Ref Range   Glucose-Capillary 125 (H) 70 - 99 mg/dL    Comment: Glucose reference range applies only to samples taken after fasting for at least 8 hours.  CBC     Status: Abnormal   Collection Time: 01/22/21 10:42  AM  Result Value Ref Range   WBC 7.8 4.0 - 10.5 K/uL   RBC 3.71 (L) 3.87 - 5.11 MIL/uL   Hemoglobin 10.4 (L) 12.0 - 15.0 g/dL   HCT 32.6 (L) 36.0 - 46.0 %   MCV 87.9 80.0 - 100.0 fL   MCH 28.0 26.0 - 34.0 pg   MCHC 31.9 30.0 - 36.0 g/dL   RDW 17.5 (H) 11.5 - 15.5 %   Platelets 174 150 - 400 K/uL   nRBC 0.0 0.0 - 0.2 %    Comment: Performed at [facility] [address], [place], [place] [address]  Differential     Status: None   Collection Time: 01/22/21 10:42 AM  Result Value Ref  Range   Neutrophils Relative % 66 %   Neutro Abs 5.1 1.7 - 7.7 K/uL   Lymphocytes Relative 27 %   Lymphs Abs 2.1 0.7 - 4.0 K/uL   Monocytes Relative 5 %   Monocytes Absolute 0.4 0.1 - 1.0 K/uL   Eosinophils Relative 1 %   Eosinophils Absolute 0.1 0.0 - 0.5 K/uL   Basophils Relative 0 %   Basophils Absolute 0.0 0.0 - 0.1 K/uL   Immature Granulocytes 1 %   Abs Immature Granulocytes 0.06 0.00 - 0.07 K/uL    Comment: Performed at [facility] [address] [place], [place] [address]  Comprehensive metabolic panel     Status: Abnormal   Collection Time: 01/22/21 10:42 AM  Result Value Ref Range   Sodium 138 135 - 145 mmol/L   Potassium 3.5 3.5 - 5.1 mmol/L   Chloride 103 98 - 111 mmol/L   CO2 25 22 - 32 mmol/L   Glucose, Bld 121 (H) 70 - 99 mg/dL    Comment: Glucose reference range applies only to samples taken after fasting for at least 8 hours.   BUN 46 (H) 6 - 20 mg/dL   Creatinine, Ser 3.52 (H) 0.44 - 1.00 mg/dL   Calcium 9.6 8.9 - 10.3 mg/dL   Total Protein 7.6 6.5 - 8.1 g/dL   Albumin 3.8 3.5 - 5.0 g/dL   AST 30 15 - 41 U/L   ALT 16 0 - 44 U/L   Alkaline Phosphatase 77 38 - 126 U/L   Total Bilirubin 0.7 0.3 - 1.2 mg/dL   GFR, Estimated 15 (L) >60 mL/min    Comment: (NOTE) Calculated using the CKD-EPI Creatinine Equation (2021)    Anion gap 10 5 - 15    Comment: Performed at [facility] [address], [place], [place] [address]  ABO/Rh     Status: None   Collection Time: 01/22/21 10:42 AM  Result Value Ref Range   ABO/RH(D)      O POS Performed at [facility] [address] [place], [place] [address]   I-Stat beta hCG blood, ED     Status: Abnormal   Collection Time: 01/22/21 10:50 AM  Result Value Ref Range   I-stat hCG, quantitative 6.2 (H) <5 mIU/mL   Comment 3            Comment:   GEST. AGE      CONC.  (mIU/mL)   <=1 WEEK        5 - 50     2 WEEKS       50 - 500     3 WEEKS       100 - 10,000     4 WEEKS  1,000 - 30,000         FEMALE AND NON-PREGNANT FEMALE:     LESS THAN 5 mIU/mL   I-stat chem 8, ED     Status: Abnormal   Collection Time: 01/22/21 10:53 AM  Result Value Ref Range   Sodium 141 135 - 145 mmol/L   Potassium 3.6 3.5 - 5.1 mmol/L   Chloride 105 98 - 111 mmol/L   BUN 57 (H) 6 - 20 mg/dL   Creatinine, Ser 3.60 (H) 0.44 - 1.00 mg/dL   Glucose, Bld 126 (H) 70 - 99 mg/dL    Comment: Glucose reference range applies only to samples taken after fasting for at least 8 hours.   Calcium, Ion 1.21 1.15 - 1.40 mmol/L   TCO2 29 22 - 32 mmol/L   Hemoglobin 11.2 (L) 12.0 - 15.0 g/dL   HCT 33.0 (L) 36.0 - 46.0 %  Resp Panel by RT-PCR (Flu A&B, Covid) Nasopharyngeal Swab     Status: None   Collection Time: 01/22/21 12:43 PM   Specimen: Nasopharyngeal Swab; Nasopharyngeal(NP) swabs in vial transport medium  Result Value Ref Range   SARS Coronavirus 2 by RT PCR NEGATIVE NEGATIVE    Comment: (NOTE) SARS-CoV-2 target nucleic acids are NOT DETECTED.  The SARS-CoV-2 RNA is generally detectable in upper respiratory specimens during the acute phase of infection. The lowest concentration of SARS-CoV-2 viral copies this assay can detect is 138 copies/mL. A negative result does not preclude SARS-Cov-2 infection and should not be used as the sole basis for treatment or other patient management decisions. A negative result may occur with  improper specimen collection/handling, submission of specimen other than nasopharyngeal swab, presence of viral mutation(s) within the areas targeted by this assay, and inadequate number of viral copies(<138 copies/mL). A negative result must be combined with clinical observations, patient history, and epidemiological information. The expected result is Negative.  Fact Sheet for Patients:  EntrepreneurPulse.com.au  Fact Sheet for Healthcare Providers:  IncredibleEmployment.be  This test is no t yet approved or cleared by the [place] FDA  and  has been authorized for detection and/or diagnosis of SARS-CoV-2 by FDA under an Emergency Use Authorization (EUA). This EUA will remain  in effect (meaning this test can be used) for the duration of the COVID-19 declaration under Section 564(b)(1) of the Act, 21 U.S.C.section 360bbb-3(b)(1), unless the authorization is terminated  or revoked sooner.       Influenza A by PCR NEGATIVE NEGATIVE   Influenza B by PCR NEGATIVE NEGATIVE    Comment: (NOTE) The Xpert Xpress SARS-CoV-2/FLU/RSV plus assay is intended as an aid in the diagnosis of influenza from Nasopharyngeal swab specimens and should not be used as a sole basis for treatment. Nasal washings and aspirates are unacceptable for Xpert Xpress SARS-CoV-2/FLU/RSV testing.  Fact Sheet for Patients: EntrepreneurPulse.com.au  Fact Sheet for Healthcare Providers: IncredibleEmployment.be  This test is not yet approved or cleared by the [place] FDA and has been authorized for detection and/or diagnosis of SARS-CoV-2 by FDA under an Emergency Use Authorization (EUA). This EUA will remain in effect (meaning this test can be used) for the duration of the COVID-19 declaration under Section 564(b)(1) of the Act, 21 U.S.C. section 360bbb-3(b)(1), unless the authorization is terminated or revoked.  Performed at [facility], [place] [address], [place], [place] [address]   Ethanol     Status: None   Collection Time: 01/22/21 12:56 PM  Result Value Ref Range   Alcohol, Ethyl (B) <  10 <10 mg/dL    Comment: (NOTE) Lowest detectable limit for serum alcohol is 10 mg/dL.  For medical purposes only. Performed at [facility], [place] [address], [place], [place] [address]   MRSA PCR Screening     Status: None   Collection Time: 01/22/21  2:15 PM   Specimen: Nasopharyngeal  Result Value Ref Range   MRSA by PCR NEGATIVE NEGATIVE    Comment:        The GeneXpert MRSA Assay (FDA approved  for NASAL specimens only), is one component of a comprehensive MRSA colonization surveillance program. It is not intended to diagnose MRSA infection nor to guide or monitor treatment for MRSA infections. Performed at [facility], [place] [address], [place], [place] [address]   APTT     Status: None   Collection Time: 01/22/21  5:06 PM  Result Value Ref Range   aPTT 31 24 - 36 seconds    Comment: Performed at [facility] [address] [place], [place] [address]  Protime-INR     Status: None   Collection Time: 01/22/21  5:06 PM  Result Value Ref Range   Prothrombin Time 13.6 11.4 - 15.2 seconds   INR 1.0 0.8 - 1.2    Comment: (NOTE) INR goal varies based on device and disease states. Performed at [facility], [place] [address], [place], [place] [address]   Lipid panel     Status: Abnormal   Collection Time: 01/22/21  5:06 PM  Result Value Ref Range   Cholesterol 175 0 - 200 mg/dL   Triglycerides 288 (H) <150 mg/dL   HDL 48 >40 mg/dL   Total CHOL/HDL Ratio 3.6 RATIO   VLDL 58 (H) 0 - 40 mg/dL   LDL Cholesterol 69 0 - 99 mg/dL    Comment:        Total Cholesterol/HDL:CHD Risk Coronary Heart Disease Risk Table                     Men   Women  1/2 Average Risk   3.4   3.3  Average Risk       5.0   4.4  2 X Average Risk   9.6   7.1  3 X Average Risk  23.4   11.0        Use the calculated Patient Ratio above and the CHD Risk Table to determine the patient's CHD Risk.        ATP III CLASSIFICATION (LDL):  <100     mg/dL   Optimal  100-129  mg/dL   Near or Above                    Optimal  130-159  mg/dL   Borderline  160-189  mg/dL   High  >190     mg/dL   Very High Performed at [facility] [address] [place], [place] [address]   Hemoglobin A1c     Status: None   Collection Time: 01/22/21  5:06 PM  Result Value Ref Range   Hgb A1c MFr Bld 5.3 4.8 - 5.6 %    Comment: (NOTE) Pre diabetes:          5.7%-6.4%  Diabetes:               >6.4%  Glycemic control for   <7.0% adults with diabetes    Mean Plasma Glucose 105.41 mg/dL  Comment: Performed at [facility], [place] [address] [place], [place] [address]  Type and screen [place]     Status: None   Collection Time: 01/22/21  5:15 PM  Result Value Ref Range   ABO/RH(D) O POS    Antibody Screen NEG    Sample Expiration      01/25/2021,2359 Performed at [facility], [place] [address] [place], [place] [address]   CBC     Status: Abnormal   Collection Time: 01/23/21 12:43 AM  Result Value Ref Range   WBC 8.6 4.0 - 10.5 K/uL   RBC 3.36 (L) 3.87 - 5.11 MIL/uL   Hemoglobin 9.8 (L) 12.0 - 15.0 g/dL   HCT 29.6 (L) 36.0 - 46.0 %   MCV 88.1 80.0 - 100.0 fL   MCH 29.2 26.0 - 34.0 pg   MCHC 33.1 30.0 - 36.0 g/dL   RDW 18.1 (H) 11.5 - 15.5 %   Platelets 183 150 - 400 K/uL   nRBC 0.0 0.0 - 0.2 %    Comment: Performed at [facility], [place] [address], [place], [place] [id number]  Basic metabolic panel     Status: Abnormal   Collection Time: 01/23/21 12:43 AM  Result Value Ref Range   Sodium 139 135 - 145 mmol/L   Potassium 2.8 (L) 3.5 - 5.1 mmol/L   Chloride 104 98 - 111 mmol/L   CO2 23 22 - 32 mmol/L   Glucose, Bld 119 (H) 70 - 99 mg/dL    Comment: Glucose reference range applies only to samples taken after fasting for at least 8 hours.   BUN 43 (H) 6 - 20 mg/dL   Creatinine, Ser 3.64 (H) 0.44 - 1.00 mg/dL   Calcium 9.2 8.9 - 10.3 mg/dL   GFR, Estimated 14 (L) >60 mL/min    Comment: (NOTE) Calculated using the CKD-EPI Creatinine Equation (2021)    Anion gap 12 5 - 15    Comment: Performed at [facility] [address], [place], [place] [address]  Potassium     Status: None   Collection Time: 01/23/21 11:20 AM  Result Value Ref Range   Potassium 3.6 3.5 - 5.1 mmol/L    Comment: Performed at [facility] [address], [place], [place] [address]  Vitamin B12     Status: None   Collection Time: 01/23/21 11:20  AM  Result Value Ref Range   Vitamin B-12 259 180 - 914 pg/mL    Comment: (NOTE) This assay is not validated for testing neonatal or myeloproliferative syndrome specimens for Vitamin B12 levels. Performed at [facility], [place] [address], [place], [place] [address]   TSH     Status: None   Collection Time: 01/23/21 11:20 AM  Result Value Ref Range   TSH 1.561 0.350 - 4.500 uIU/mL    Comment: Performed by a 3rd Generation assay with a functional sensitivity of <=0.01 uIU/mL. Performed at [facility], [place] [address] [place], [place] [address]   HIV Antibody (routine testing w rflx)     Status: None   Collection Time: 01/23/21 11:20 AM  Result Value Ref Range   HIV Screen 4th Generation wRfx Non Reactive Non Reactive    Comment: Performed at [facility], [place] [address] [place], [place] [address]  Urine rapid drug screen (hosp performed)     Status: None   Collection Time: 01/23/21 12:24 PM  Result  Value Ref Range   Opiates NONE DETECTED NONE DETECTED   Cocaine NONE DETECTED NONE DETECTED   Benzodiazepines NONE DETECTED NONE DETECTED   Amphetamines NONE DETECTED NONE DETECTED   Tetrahydrocannabinol NONE DETECTED NONE DETECTED   Barbiturates NONE DETECTED NONE DETECTED    Comment: (NOTE) DRUG SCREEN FOR MEDICAL PURPOSES ONLY.  IF CONFIRMATION IS NEEDED FOR ANY PURPOSE, NOTIFY LAB WITHIN 5 DAYS.  LOWEST DETECTABLE LIMITS FOR URINE DRUG SCREEN Drug Class                     Cutoff (ng/mL) Amphetamine and metabolites    1000 Barbiturate and metabolites    200 Benzodiazepine                 [id number] Tricyclics and metabolites     300 Opiates and metabolites        300 Cocaine and metabolites        300 THC                            50 Performed at [facility], [place] [address], [place], [place] [address]   Urinalysis, Routine w reflex microscopic     Status: Abnormal   Collection Time: 01/23/21 12:24 PM  Result Value Ref Range   Color, Urine YELLOW  YELLOW   APPearance HAZY (A) CLEAR   Specific Gravity, Urine 1.013 1.005 - 1.030   pH 5.0 5.0 - 8.0   Glucose, UA NEGATIVE NEGATIVE mg/dL   Hgb urine dipstick NEGATIVE NEGATIVE   Bilirubin Urine NEGATIVE NEGATIVE   Ketones, ur NEGATIVE NEGATIVE mg/dL   Protein, ur 100 (A) NEGATIVE mg/dL   Nitrite NEGATIVE NEGATIVE   Leukocytes,Ua NEGATIVE NEGATIVE   RBC / HPF 0-5 0 - 5 RBC/hpf   WBC, UA 0-5 0 - 5 WBC/hpf   Bacteria, UA RARE (A) NONE SEEN   Squamous Epithelial / LPF 0-5 0 - 5   Mucus PRESENT     Comment: Performed at [facility], [address], [place], [place] [address]  Triglycerides     Status: Abnormal   Collection Time: 01/24/21  3:50 AM  Result Value Ref Range   Triglycerides 162 (H) <150 mg/dL    Comment: Performed at [facility] [address], [place],  [address]    Lipid Panel Recent Labs    01/22/21 1706 01/24/21 0350  CHOL 175  --   TRIG 288* 162*  HDL 48  --   CHOLHDL 3.6  --   VLDL 58*  --   LDLCALC 69  --     Studies/Results:  BRAIN MRI/MRA  IMPRESSION: 1. Acute 8 mm hemorrhage in the right pons, likely similar in size when comparing across modalities. Associated edema throughout the surrounding pons. No obvious acute infarct, mass lesion, or vascular malformation although acute blood products and the absence of contrast limits evaluation. Repeat imaging after resolution of the hemorrhage could further evaluate if clinically indicated. 2. Age-advanced moderate chronic microvascular ischemic disease. 3. Findings suggestive multiple prior infratentorial and supratentorial microhemorrhages, described above and most likely hypertensive in etiology. 4. No large vessel occlusion or proximal hemodynamically significant stenosis intracranially.    NECK MRA IMPRESSION: 1. No evidence of significant stenosis on this motion limited MRA. 2. Tortuous vessels in the neck, likely secondary to the patient's known  hypertension.    TTE 1. Left ventricular ejection fraction, by estimation, is 60  to 65%. The  left ventricle has normal function. The left ventricle has no regional  wall motion abnormalities. There is moderate concentric left ventricular  hypertrophy. Left ventricular  diastolic parameters are indeterminate. Elevated left ventricular  end-diastolic pressure. Global longitudinal strain performed but not  reported based on interpreter judgement due to suboptimal tracking.  2. Right ventricular systolic function is normal. The right ventricular  size is normal. Tricuspid regurgitation signal is inadequate for assessing  PA pressure.  3. The mitral valve is normal in structure. No evidence of mitral valve  regurgitation. No evidence of mitral stenosis.  4. The aortic valve is tricuspid. Aortic valve regurgitation is not  visualized. Mild aortic valve sclerosis is present, with no evidence of  aortic valve stenosis.  5. The inferior vena cava is normal in size with <50% respiratory  variability, suggesting right atrial pressure of 8 mmHg.       Medications:  Scheduled Meds: . amLODipine  10 mg Oral Daily  . carvedilol  12.5 mg Oral BID WC  . Chlorhexidine Gluconate Cloth  6 each Topical Daily  . pantoprazole (PROTONIX) IV  40 mg Intravenous QHS  . potassium chloride  40 mEq Oral BID  . senna-docusate  1 tablet Oral BID   Continuous Infusions: . clevidipine 17 mg/hr (01/24/21 0822)   PRN Meds:.acetaminophen **OR** acetaminophen (TYLENOL) oral liquid 160 mg/5 mL **OR** acetaminophen, labetalol    This patient is critically ill due to stroke/bleed and at significant risk of neurological worsening, death form severe anemia, bleeding, recurrent stroke, intracranial stenosis. This patient's care requires constant monitoring of vital signs, hemodynamics, respiratory and cardiac monitoring, review of multiple databases, neurological assessment, other specialists and medical decision  making of high complexity. I spent 40 minutes of neurocritical care time in the care of this patient    LOS: 2 days   Meira Wahba A. Merlene Laughter, M.D.  Diplomate, Tax adviser of Psychiatry and Neurology ( Neurology).

## 2021-01-25 ENCOUNTER — Encounter (HOSPITAL_COMMUNITY): Payer: Self-pay | Admitting: Neurology

## 2021-01-25 ENCOUNTER — Other Ambulatory Visit: Payer: Self-pay

## 2021-01-25 LAB — HOMOCYSTEINE: Homocysteine: 37.1 umol/L — ABNORMAL HIGH (ref 0.0–14.5)

## 2021-01-25 LAB — BASIC METABOLIC PANEL
Anion gap: 7 (ref 5–15)
BUN: 39 mg/dL — ABNORMAL HIGH (ref 6–20)
CO2: 22 mmol/L (ref 22–32)
Calcium: 9.4 mg/dL (ref 8.9–10.3)
Chloride: 108 mmol/L (ref 98–111)
Creatinine, Ser: 3.86 mg/dL — ABNORMAL HIGH (ref 0.44–1.00)
GFR, Estimated: 13 mL/min — ABNORMAL LOW (ref >60)
Glucose, Bld: 101 mg/dL — ABNORMAL HIGH (ref 70–99)
Potassium: 4.1 mmol/L (ref 3.5–5.1)
Sodium: 137 mmol/L (ref 135–145)

## 2021-01-25 MED ORDER — HYDRALAZINE HCL 50 MG PO TABS
50.0000 mg | ORAL_TABLET | Freq: Three times a day (TID) | ORAL | Status: DC
  Administered 2021-01-25 – 2021-01-26 (×3): 50 mg via ORAL
  Filled 2021-01-25 (×4): qty 1

## 2021-01-25 MED ORDER — HYDRALAZINE HCL 20 MG/ML IJ SOLN
20.0000 mg | Freq: Four times a day (QID) | INTRAMUSCULAR | Status: DC | PRN
  Administered 2021-01-26: 20 mg via INTRAVENOUS
  Filled 2021-01-25: qty 1

## 2021-01-25 MED ORDER — LABETALOL HCL 5 MG/ML IV SOLN
20.0000 mg | INTRAVENOUS | Status: DC | PRN
  Administered 2021-01-26: 5 mg via INTRAVENOUS
  Filled 2021-01-25: qty 4

## 2021-01-25 MED ORDER — PANTOPRAZOLE SODIUM 40 MG PO TBEC
40.0000 mg | DELAYED_RELEASE_TABLET | Freq: Every day | ORAL | Status: DC
  Administered 2021-01-25 – 2021-01-26 (×2): 40 mg via ORAL
  Filled 2021-01-25 (×2): qty 1

## 2021-01-25 NOTE — Progress Notes (Signed)
Occupational Therapy Treatment Patient Details Name: Robin Navarro MRN: [id number] DOB: November 20, 1965 Today's Date: 01/25/2021    History of present illness Pt is a 55 y.o. F who presents with slurred speech and mild left sided weakness. 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 Significant PMH: CKD, uncontrolled HTN, anemia, tobacco and marijuana use.   OT comments  This 55 yo female making good progress with using her LUE (non-dominant) for activities throughout the day (per her report) and with activities I gave her. She will continue to benefit from acute OT with follow up OPOT still be recommended.  Follow Up Recommendations  Outpatient OT;Supervision - Intermittent          Precautions / Restrictions Precautions Precaution Comments: BP <160/ < 100 Restrictions Weight Bearing Restrictions: No                     Exercises Other Exercises Other Exercises: Instructed pt and had her perform finger opposition, finger adduction/abduction, opening containers, finger-nose (but using an object to reach for instead of finger), pulling tape off of roll and tearing it. Gave her all the activies to continue to do when commericals are on the TV.            Frequency  Min 2X/week        Progress Toward Goals  OT Goals(current goals can now be found in the care plan section)  Progress towards OT goals: Progressing toward goals  Acute Rehab OT Goals Patient Stated Goal: return to baseline OT Goal Formulation: With patient Time For Goal Achievement: 02/06/21 Potential to Achieve Goals: Good  Plan Discharge plan remains appropriate       AM-PAC OT "6 Clicks" Daily Activity     Outcome Measure   Help from another person eating meals?: None Help from another person taking care of personal grooming?: A Little Help from another person toileting, which includes using toliet, bedpan, or urinal?: A Little Help from another person bathing (including washing,  rinsing, drying)?: A Little Help from another person to put on and taking off regular upper body clothing?: A Little Help from another person to put on and taking off regular lower body clothing?: A Little 6 Click Score: 19    End of Session    OT Visit Diagnosis: Hemiplegia and hemiparesis Hemiplegia - Right/Left: Left Hemiplegia - dominant/non-dominant: Non-Dominant Hemiplegia - caused by: Nontraumatic intracerebral hemorrhage   Activity Tolerance Patient tolerated treatment well   Patient Left in chair;with chair alarm set   Nurse Communication  (pt says she would like to try her own ADL next time all on her own post set up)        Time: 1512-1536 OT Time Calculation (min): 24 min  Charges: OT General Charges $OT Visit: 1 Visit OT Treatments $Therapeutic Exercise: 23-37 mins  Robin Navarro, OTR/L Acute [employer] Pager [phone] Office [phone]      Almon Register 01/25/2021, 4:26 PM

## 2021-01-25 NOTE — Progress Notes (Signed)
Nutrition Brief Note  Patient identified on the Malnutrition Screening Tool (MST) Report Per pt she lost some weight once she started a new HTN medication back in October 2021. She has not lost any further weight since that time and reports that she has never had a poor appetite.  Pt reports that she does not follow any specific diet at home.  Reviewed low sodium diet with patient who was receptive, handouts provided.    Wt Readings from Last 15 Encounters:  01/25/21 78 kg  07/22/20 87.1 kg  06/12/20 88 kg  02/12/13 89.3 kg  02/09/12 87.5 kg  02/04/12 89.4 kg    Body mass index is 34.74 kg/m. Patient meets criteria for obesity based on current BMI.   Current diet order is Heart Healthy, patient is consuming approximately 100% of meals at this time. Labs and medications reviewed.   No nutrition interventions warranted at this time. If nutrition issues arise, please consult RD.   Lockie Pares., RD, LDN, CNSC See AMiON for contact information

## 2021-01-25 NOTE — Progress Notes (Signed)
Physical Therapy Treatment Patient Details Name: Robin Navarro MRN: [id number] DOB: 06-16-1966 Today's Date: 01/25/2021    History of Present Illness Pt is a 55 y.o. F who presents with slurred speech and mild left sided weakness. 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 Significant PMH: CKD, uncontrolled HTN, anemia, tobacco and marijuana use.    PT Comments    Pt making steady improvement towards physical therapy goals; ambulating x 300 feet with no assistive device and negotiated x 3 steps at a supervision level. Scoring 20/24 on the Dynamic Gait Index, indicating she is not at high risk for falls, however, continues with slowed gait speed of 1.34 ft/s, which is indicative of a household ambulator. Pt displays decreased L hand fine motor coordination and reports she has been trying to use it frequently for functional tasks. Recommend OPPT to address deficits and maximize functional mobility.     Follow Up Recommendations  Supervision - Intermittent;Outpatient PT     Equipment Recommendations  None recommended by PT    Recommendations for Other Services       Precautions / Restrictions Precautions Precautions: Other (comment) Precaution Comments: BP <160/ < 100 Restrictions Weight Bearing Restrictions: No    Mobility  Bed Mobility Overal bed mobility: Independent             General bed mobility comments: OOB in bathroom upon entry; independent with return to bed    Transfers Overall transfer level: Independent Equipment used: None                Ambulation/Gait Ambulation/Gait assistance: Supervision Gait Distance (Feet): 300 Feet Assistive device: None Gait Pattern/deviations: Step-through pattern;Decreased stride length Gait velocity: 1.34 ft/s Gait velocity interpretation: <1.8 ft/sec, indicate of risk for recurrent falls General Gait Details: Decreased left arm swing, cues for neutral left positioning. No overt LOB with higher level  balance tasks   Stairs Stairs: Yes Stairs assistance: Supervision Stair Management: One rail Left Number of Stairs: 3 General stair comments: Step over step pattern   Wheelchair Mobility    Modified Rankin (Stroke Patients Only) Modified Rankin (Stroke Patients Only) Pre-Morbid Rankin Score: No symptoms Modified Rankin: Moderately severe disability     Balance Overall balance assessment: Mild deficits observed, not formally tested                               Standardized Balance Assessment Standardized Balance Assessment : Dynamic Gait Index   Dynamic Gait Index Level Surface: Mild Impairment Change in Gait Speed: Mild Impairment Gait with Horizontal Head Turns: Normal Gait with Vertical Head Turns: Normal Gait and Pivot Turn: Normal Step Over Obstacle: Mild Impairment Step Around Obstacles: Normal Steps: Mild Impairment Total Score: 20      Cognition Arousal/Alertness: Awake/alert Behavior During Therapy: Flat affect Overall Cognitive Status: Within Functional Limits for tasks assessed                                        Exercises      General Comments        Pertinent Vitals/Pain Pain Assessment: Faces Faces Pain Scale: Hurts a little bit Pain Location: R thigh Pain Descriptors / Indicators: Sore Pain Intervention(s): Monitored during session    Home Living  Prior Function            PT Goals (current goals can now be found in the care plan section) Acute Rehab PT Goals Patient Stated Goal: return to baseline PT Goal Formulation: With patient Time For Goal Achievement: 02/06/21 Potential to Achieve Goals: Good Progress towards PT goals: Progressing toward goals    Frequency    Min 4X/week      PT Plan Current plan remains appropriate    Co-evaluation              AM-PAC PT "6 Clicks" Mobility   Outcome Measure  Help needed turning from your back to your side  while in a flat bed without using bedrails?: None Help needed moving from lying on your back to sitting on the side of a flat bed without using bedrails?: None Help needed moving to and from a bed to a chair (including a wheelchair)?: A Little Help needed standing up from a chair using your arms (e.g., wheelchair or bedside chair)?: A Little Help needed to walk in hospital room?: A Little Help needed climbing 3-5 steps with a railing? : A Little 6 Click Score: 20    End of Session   Activity Tolerance: Patient tolerated treatment well Patient left: with call bell/phone within reach;in bed Nurse Communication: Mobility status PT Visit Diagnosis: Unsteadiness on feet (R26.81)     Time: [id number] PT Time Calculation (min) (ACUTE ONLY): 15 min  Charges:  $Gait Training: 8-22 mins                     Wyona Almas, PT, DPT [place] Pager [phone] Office [phone]    Deno Etienne 01/25/2021, 9:49 AM

## 2021-01-25 NOTE — TOC CAGE-AID Note (Signed)
Transition of Care [facility]) - CAGE-AID Screening   Patient Details  Name: Robin Navarro MRN: [id number] Date of Birth: 10-15-1965  Transition of Care [facility]) CM/SW Contact:    Ella Bodo, RN Phone Number: 01/25/2021, 12:23 PM   Clinical Narrative: Pt s/p acute Rt pons hemorrhagic stroke.  She admits to ETOH and marijuana use approximately twice a week.  She states that it is not a problem for her, and denies need for SA resources.    CAGE-AID Screening:    Have You Ever Felt You Ought to Cut Down on Your Drinking or Drug Use?: No Have People Annoyed You By Critizing Your Drinking Or Drug Use?: No Have You Felt Bad Or Guilty About Your Drinking Or Drug Use?: No Have You Ever Had a Drink or Used Drugs First Thing In The Morning to Steady Your Nerves or to Get Rid of a Hangover?: No CAGE-AID Score: 0  Substance Abuse Education Offered: Yes (Pt declines need for cessation resources)     Reinaldo Raddle, RN, BSN  Trauma/Neuro ICU Case Manager [phone]

## 2021-01-25 NOTE — TOC Initial Note (Addendum)
Transition of Care [facility]) - Initial/Assessment Note    Patient Details  Name: Robin Navarro MRN: [id number] Date of Birth: [birth year]  Transition of Care [facility]) CM/SW Contact:    Ella Bodo, RN Phone Number: 01/25/2021, 5:07 PM  Clinical Narrative:    Pt is a 55 y.o. F who presents with slurred speech and mild left sided weakness. 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  PTA, pt independent; lives at home with sister, who can provide assistance at dc.  PT/OT and ST recommending OP follow up, and patient is agreeable to services.  She states family can provide transport to follow up appointments. PCP is Dr. [name]; will arrange PCP follow up appt.    Will refer patient for OP PT/OT and ST at [facility]; rehab center will call patient for an appointment.   Should patient have discharge meds, recommend sending dc Rx to [facility] pharmacy to be filled using [facility] letter for assistance.                 Expected Discharge Plan: OP Rehab Barriers to Discharge: Continued Medical Work up   Patient Goals and CMS Choice Patient states their goals for this hospitalization and ongoing recovery are:: to go home      Expected Discharge Plan and Services Expected Discharge Plan: OP Rehab   Discharge Planning Services: CM Consult   Living arrangements for the past 2 months: Apartment                                      Prior Living Arrangements/Services Living arrangements for the past 2 months: Apartment Lives with:: Siblings Patient language and need for interpreter reviewed:: Yes Do you feel safe going back to the place where you live?: Yes      Need for Family Participation in Patient Care: Yes (Comment) Care giver support system in place?: Yes (comment)   Criminal Activity/Legal Involvement Pertinent to Current Situation/Hospitalization: No - Comment as needed  Activities of Daily Living Home Assistive Devices/Equipment: Blood pressure  cuff ADL Screening (condition at time of admission) Patient's cognitive ability adequate to safely complete daily activities?: Yes Is the patient deaf or have difficulty hearing?: No Does the patient have difficulty seeing, even when wearing glasses/contacts?: No Does the patient have difficulty concentrating, remembering, or making decisions?: No Patient able to express need for assistance with ADLs?: Yes Does the patient have difficulty dressing or bathing?: No Independently performs ADLs?: Yes (appropriate for developmental age) Does the patient have difficulty walking or climbing stairs?: No Weakness of Legs: None Weakness of Arms/Hands: None  Permission Sought/Granted                  Emotional Assessment Appearance:: Appears stated age Attitude/Demeanor/Rapport: Engaged Affect (typically observed): Accepting Orientation: : Oriented to Self,Oriented to Place,Oriented to  Time,Oriented to Situation      Admission diagnosis:  Intracranial hemorrhage ([place]) [I62.9] Stroke due to intracerebral hemorrhage ([place]) [I61.9] Hypertensive emergency [I16.1] Patient Active Problem List   Diagnosis Date Noted  . Stroke due to intracerebral hemorrhage ([place]) 01/22/2021  . Hypokalemia 07/19/2020  . Elevated troponin 07/19/2020  . Abnormal EKG 07/19/2020  . Visual disturbance 07/19/2020  . Hypertensive emergency 07/19/2020  . AKI (acute kidney injury) ([place]) 06/13/2020  . Normocytic anemia 06/13/2020  . Acute kidney injury ([place]) 06/13/2020  . Acute bronchitis 02/11/2013  . Community  acquired pneumonia 02/10/2013  . HTN (hypertension) 02/10/2013   PCP:  [name], MD Pharmacy:   [facility] [place] [address] Phone: [phone] Fax: [phone]  [place] - [address] [place] [address] Phone:  [phone] Fax: [place] [address] [address] Phone: [phone] Fax: [phone]     Social Determinants of Health (SDOH) Interventions    Readmission Risk Interventions No flowsheet data found.  Reinaldo Raddle, RN, BSN  Trauma/Neuro ICU Case Manager [phone]

## 2021-01-25 NOTE — Progress Notes (Signed)
STROKE TEAM PROGRESS NOTE   INTERVAL HISTORY She continues to endorse some speech difficulty, numbness and weakness involving the left side but feels it is getting better every day.  She reports she takes her blood pressure medications recently but did miss a few doses.  Nursing attempting to wean Cleviprex drip per our bedside discussion today. We discussed her stroke diagnosis, plan of care and ongoing work up.   Vitals:   01/25/21 0630 01/25/21 0700 01/25/21 0719 01/25/21 0800  BP: (!) 158/93 (!) 150/97 (!) 155/93 (!) 163/107  Pulse: 71 66 74 62  Resp: 15 (!) 23  16  Temp:    98.2 F (36.8 C)  TempSrc:    Oral  SpO2: 93% 94%  97%   CBC:  Recent Labs  Lab 01/22/21 1042 01/22/21 1053 01/23/21 0043 01/24/21 0950  WBC 7.8  --  8.6 8.3  NEUTROABS 5.1  --   --   --   HGB 10.4*   < > 9.8* 9.2*  HCT 32.6*   < > 29.6* 28.5*  MCV 87.9  --  88.1 89.1  PLT 174  --  183 212   < > = values in this interval not displayed.   Basic Metabolic Panel:  Recent Labs  Lab 01/23/21 0043 01/23/21 1120 01/24/21 0950  NA 139  --  138  K 2.8* 3.6 3.5  CL 104  --  106  CO2 23  --  23  GLUCOSE 119*  --  118*  BUN 43*  --  38*  CREATININE 3.64*  --  3.50*  CALCIUM 9.2  --  9.5  MG  --   --  2.2  PHOS  --   --  3.9   Lipid Panel:  Recent Labs  Lab 01/22/21 1706 01/24/21 0350  CHOL 175  --   TRIG 288* 162*  HDL 48  --   CHOLHDL 3.6  --   VLDL 58*  --   LDLCALC 69  --    HgbA1c:  Recent Labs  Lab 01/22/21 1706  HGBA1C 5.3   Urine Drug Screen:  Recent Labs  Lab 01/23/21 1224  LABOPIA NONE DETECTED  COCAINSCRNUR NONE DETECTED  LABBENZ NONE DETECTED  AMPHETMU NONE DETECTED  THCU NONE DETECTED  LABBARB NONE DETECTED    Alcohol Level  Recent Labs  Lab 01/22/21 1256  ETH <10   IMAGING past 24 hours  MRI 1. Acute 8 mm hemorrhage in the right pons, likely similar in size when comparing across modalities. Associated edema throughout the surrounding pons. No obvious  acute infarct, mass lesion, or vascular malformation although acute blood products and the absence of contrast limits evaluation. Repeat imaging after resolution of the hemorrhage could further evaluate if clinically indicated. 2. Age-advanced moderate chronic microvascular ischemic disease. 3. Findings suggestive multiple prior infratentorial and supratentorial microhemorrhages, described above and most likely hypertensive in etiology. 4. No large vessel occlusion or proximal hemodynamically significant stenosis intracranially.  PHYSICAL EXAM GENERAL: [name] siting up in bed eating breakfast in NAD  HEENT: Neck is supple no trauma noted.  EXTREMITIES: No edema   BACK: Normal  SKIN: Normal by inspection.    MENTAL STATUS: Alert and oriented. Speech, language and cognition are generally intact. Judgment and insight normal.   CRANIAL NERVES: Pupils are equal, round and reactive to light and accomodation; extra ocular movements are full, there is no significant nystagmus; visual fields are full; upper and lower facial muscles revealed mild weakness of  the lower left facial muscle and also mild weakness of the obicularis oculi muscle also on the left side, there is mild flattening of the nasolabial fold on the left side; tongue is midline; uvula is midline; shoulder elevation is normal.  MOTOR: There is mild weakness of the left upper extremity and left leg although direct strength testing is normal.  Diminished fine finger movements on the left.  Orbits right over left upper extremity.  Mild left grip weakness. The other extremities show normal tone, bulk and strength; no pronator drift.  COORDINATION: Left finger to nose is normal, right finger to nose is normal, No rest tremor; no intention tremor; no postural tremor; no bradykinesia.  SENSATION: Slightly diminished touch pinprick sensation left upper and lower extremity.  Normal on the  right  ASSESSMENT/PLAN Robin Navarro is a 55 y.o. female with a medical history significant for chronic kidney disease, uncontrolled hypertension, anemia, tobacco and marijuana use who presented to the ED via EMS for evaluation of slurred speech. She states that this morning at 05:30 before going to bed she had a sudden onset of sharp neck pain that radiated upward towards her right eye with a sensation of right facial swelling and slurred speech. She states that the symptoms varied in intensity with the headache subsiding with use of acetaminophen by approximately 07:30 but with persistent slurred speech. She became concerned with the ongoing speech problems and EMS was activated. On arrival, EMS noted a blood pressure of 260 / 200 and transported [name] to the ED where her blood pressure was manually obtained with a reading of 290 / 160. With her persistent slurred speech, a Code Stroke was activated by the EDP for neurology evaluation, she additionally had some mild left-sided weakness.  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 Yes with Assist; Fluid consistency: Thin     No AC/AP prior to admission  Therapy recommendations:  Outpatient PT, SLP, outpatient OT  Disposition:  Likely home with outpatient therapies when medically ready   Hypertension  Home meds: coreg, hydralazine, norvasc, hygroton  Titrated hydralazine from 25 to '50mg'$  TID today, added hydralazine prn. Discussed with Hailey, RPh. Per chart review BP meds have not been filled appropriately.   Had recent admission 06/2020 and 07/2020 for uncontrolled HTN with no meds x one year  . BP goal 160/100 . Asymptomatic bradycardia to 50s a few times since admission . Avoid nephrotoxic agents   Hyperlipidemia  Home meds:  none  LDL 69, at goal < 70  CKD  Ctn  3.64->3.5->3.86  Her baseline is unclear, but has been 3.5-3.9 since last Sep/Oct 2021 on checks here  Avoid nephrotoxic agents   Other Stroke Risk Factors  Cigar smoker, advised to stop smoking  ETOH use, alcohol level <10, advised to drink no more than 1 drink(s) a day  Substance abuse - THC. Advised to abstain.   Obesity, There is no height or weight on file to calculate BMI., BMI >/= 30 associated with increased stroke risk, recommend weight loss, diet and exercise as appropriate   Hospital day # 3 I have personally obtained history,examined this patient, reviewed notes, independently viewed imaging studies, participated in medical decision making and plan of care.ROS completed by me personally and pertinent positives fully documented  I have made any additions or clarifications  directly to the above note. Agree with note above.  Recommend wean Cleviprex drip and use as needed IV labetalol and hydralazine as well as increase oral home oral medication dosages with systolic blood pressure goal below 160.  Continue mobilization out of bed and ongoing therapies.  Transfer to neurology floor bed if she is off Cleviprex drip later today.  Hopefully transfer to rehab in the next few days.  Long discussion with patient and family at the bedside and answered questions.This patient is critically ill and at significant risk of neurological worsening, death and care requires constant monitoring of vital signs, hemodynamics,respiratory and cardiac monitoring, extensive review of multiple databases, frequent neurological assessment, discussion with family, other specialists and medical decision making of high complexity.I have made any additions or clarifications directly to the above note.This critical care time does not reflect procedure time, or teaching time or supervisory time of PA/NP/Med Resident etc but could involve care discussion time.  I spent 30 minutes of neurocritical care time  in the care of   this patient.      Antony Contras, MD Medical Director [place] Pager: [phone] 01/25/2021 5:08 PM    To contact Stroke Continuity provider, please refer to [email or web address] After hours, contact General Neurology

## 2021-01-26 LAB — BASIC METABOLIC PANEL
Anion gap: 9 (ref 5–15)
BUN: 43 mg/dL — ABNORMAL HIGH (ref 6–20)
CO2: 22 mmol/L (ref 22–32)
Calcium: 9.4 mg/dL (ref 8.9–10.3)
Chloride: 108 mmol/L (ref 98–111)
Creatinine, Ser: 3.82 mg/dL — ABNORMAL HIGH (ref 0.44–1.00)
GFR, Estimated: 14 mL/min — ABNORMAL LOW (ref >60)
Glucose, Bld: 96 mg/dL (ref 70–99)
Potassium: 4.6 mmol/L (ref 3.5–5.1)
Sodium: 139 mmol/L (ref 135–145)

## 2021-01-26 LAB — PHOSPHORUS: Phosphorus: 4.6 mg/dL (ref 2.5–4.6)

## 2021-01-26 LAB — MAGNESIUM: Magnesium: 2 mg/dL (ref 1.7–2.4)

## 2021-01-26 MED ORDER — LABETALOL HCL 100 MG PO TABS
100.0000 mg | ORAL_TABLET | Freq: Two times a day (BID) | ORAL | Status: DC
  Administered 2021-01-26: 100 mg via ORAL
  Filled 2021-01-26 (×2): qty 1

## 2021-01-26 MED ORDER — ISOSORB DINITRATE-HYDRALAZINE 20-37.5 MG PO TABS
2.0000 | ORAL_TABLET | Freq: Three times a day (TID) | ORAL | Status: DC
  Administered 2021-01-26 – 2021-01-27 (×3): 2 via ORAL
  Filled 2021-01-26 (×5): qty 2

## 2021-01-26 NOTE — Progress Notes (Addendum)
Physical Therapy Treatment Patient Details Name: Robin Navarro MRN: [id number] DOB: [birth year] Today's Date: 01/26/2021    History of Present Illness Pt is a 55 y.o. F who presents with slurred speech and mild left sided weakness. 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 Significant PMH: CKD, uncontrolled HTN, anemia, tobacco and marijuana use.    PT Comments    Pt progressing well towards her physical therapy goals. Ambulating x 500 feet with no assistive device at a supervision level. Able to perform side stepping and backwards walking without overt loss of balance. BP post walk 162/98. Pt continues with decreased gait speed of 1.34 ft/s, which is indicative of a household ambulator and decreased LUE coordination. D/c plan remains appropriate.     Follow Up Recommendations  Supervision - Intermittent;Outpatient PT     Equipment Recommendations  None recommended by PT    Recommendations for Other Services       Precautions / Restrictions Precautions Precautions: Other (comment) Precaution Comments: BP <160/ < 100 Restrictions Weight Bearing Restrictions: No    Mobility  Bed Mobility               General bed mobility comments: OOB in recliner upon entry    Transfers Overall transfer level: Independent Equipment used: None                Ambulation/Gait Ambulation/Gait assistance: Supervision Gait Distance (Feet): 500 Feet Assistive device: None Gait Pattern/deviations: Step-through pattern;Decreased stride length Gait velocity: 1.34 ft/s   General Gait Details: Cues provided for left arm swing, continues with slow gait speed   Stairs             Wheelchair Mobility    Modified Rankin (Stroke Patients Only) Modified Rankin (Stroke Patients Only) Pre-Morbid Rankin Score: No symptoms Modified Rankin: Moderately severe disability     Balance Overall balance assessment: Mild deficits observed, not formally tested                                    Dynamic Gait Index Steps: Mild Impairment      Cognition Arousal/Alertness: Awake/alert Behavior During Therapy: Flat affect Overall Cognitive Status: Within Functional Limits for tasks assessed                                        Exercises Other Exercises Other Exercises: side stepping to R/L and backwards walking x 20 ft each    General Comments        Pertinent Vitals/Pain Pain Assessment: Faces Faces Pain Scale: Hurts a little bit Pain Location: R thigh Pain Descriptors / Indicators: Burning Pain Intervention(s): Monitored during session    Home Living                      Prior Function            PT Goals (current goals can now be found in the care plan section) Acute Rehab PT Goals Patient Stated Goal: return to baseline PT Goal Formulation: With patient Time For Goal Achievement: 02/06/21 Potential to Achieve Goals: Good Progress towards PT goals: Progressing toward goals    Frequency    Min 4X/week      PT Plan Current plan remains appropriate    Co-evaluation  AM-PAC PT "6 Clicks" Mobility   Outcome Measure  Help needed turning from your back to your side while in a flat bed without using bedrails?: None Help needed moving from lying on your back to sitting on the side of a flat bed without using bedrails?: None Help needed moving to and from a bed to a chair (including a wheelchair)?: A Little Help needed standing up from a chair using your arms (e.g., wheelchair or bedside chair)?: A Little Help needed to walk in hospital room?: A Little Help needed climbing 3-5 steps with a railing? : A Little 6 Click Score: 20    End of Session   Activity Tolerance: Patient tolerated treatment well Patient left: with call bell/phone within reach;in bed Nurse Communication: Mobility status PT Visit Diagnosis: Unsteadiness on feet (R26.81)     Time: [id number] PT  Time Calculation (min) (ACUTE ONLY): 21 min  Charges:  $Therapeutic Activity: 8-22 mins                     Wyona Almas, PT, DPT [place] Pager [phone] Office [phone]    Deno Etienne 01/26/2021, 5:04 PM

## 2021-01-26 NOTE — Discharge Instructions (Signed)

## 2021-01-26 NOTE — Consult Note (Signed)
Reason for Consult: Uncontrolled hypertension associated with marked sinus bradycardia/pauses above 3 seconds asymptomatic Referring Physician: Stroke team  Robin Navarro is an 55 y.o. female.  HPI: Patient is 55 year old female with past medical history significant for hypertensive heart disease, chronic kidney disease stage IV, anemia of chronic disease, tobacco abuse, marijuana abuse, was admitted on 01/22/2021 because of slurred speech with facial asymmetry and neck pain and headache and was noted to have acute right pons hemorrhagic stroke.  Patient was also noted by EMS to have blood pressure of 260/200 and in the ED was noted to have blood pressure of 290/160.  Cardiology consultation is called because patient has labile blood pressure and was noted to have mild sinus bradycardia with pauses above 3 seconds transiently asymptomatic.  Patient did receive 2 doses of p.o. Lopressor 100 mg and now has been switched to labetalol.  Patient has received IV hydralazine and labetalol while in the hospital.  Presently patient is heart rate in the 70s blood pressure in [id number] systolic and diastolic in the low [id number].  Patient denies any chest pain nausea vomiting or diaphoresis.  Denies any palpitations denies any history of syncope.  States overall feels better.  EKG done in the hospital showed normal sinus rhythm with LVH and strain pattern.  Past Medical History:  Diagnosis Date  . Anemia   . Chronic kidney disease   . Hypertension     Past Surgical History:  Procedure Laterality Date  . NO PAST SURGERIES      Family History  Problem Relation Age of Onset  . Diabetes Mother   . Hypertension Mother     Social History:  reports that she has been smoking cigars. She has been smoking about 0.01 packs per day. She has never used smokeless tobacco. She reports current alcohol use. She reports current drug use. Drug: Marijuana.  Allergies:  Allergies  Allergen Reactions  . Lisinopril Cough     Medications: I have reviewed the patient's current medications.  Results for orders placed or performed during the hospital encounter of 01/22/21 (from the past 48 hour(s))  Basic metabolic panel     Status: Abnormal   Collection Time: 01/25/21  8:38 AM  Result Value Ref Range   Sodium 137 135 - 145 mmol/L   Potassium 4.1 3.5 - 5.1 mmol/L   Chloride 108 98 - 111 mmol/L   CO2 22 22 - 32 mmol/L   Glucose, Bld 101 (H) 70 - 99 mg/dL    Comment: Glucose reference range applies only to samples taken after fasting for at least 8 hours.   BUN 39 (H) 6 - 20 mg/dL   Creatinine, Ser 3.86 (H) 0.44 - 1.00 mg/dL   Calcium 9.4 8.9 - 10.3 mg/dL   GFR, Estimated 13 (L) >60 mL/min    Comment: (NOTE) Calculated using the CKD-EPI Creatinine Equation (2021)    Anion gap 7 5 - 15    Comment: Performed at [facility] [address], [place], [place] [id number]  Basic metabolic panel     Status: Abnormal   Collection Time: 01/26/21 11:57 AM  Result Value Ref Range   Sodium 139 135 - 145 mmol/L   Potassium 4.6 3.5 - 5.1 mmol/L   Chloride 108 98 - 111 mmol/L   CO2 22 22 - 32 mmol/L   Glucose, Bld 96 70 - 99 mg/dL    Comment: Glucose reference range applies only to samples taken after fasting for at least 8 hours.  BUN 43 (H) 6 - 20 mg/dL   Creatinine, Ser 3.82 (H) 0.44 - 1.00 mg/dL   Calcium 9.4 8.9 - 10.3 mg/dL   GFR, Estimated 14 (L) >60 mL/min    Comment: (NOTE) Calculated using the CKD-EPI Creatinine Equation (2021)    Anion gap 9 5 - 15    Comment: Performed at [facility] [address] [place], [place] [address]  Magnesium     Status: None   Collection Time: 01/26/21 11:57 AM  Result Value Ref Range   Magnesium 2.0 1.7 - 2.4 mg/dL    Comment: Performed at [facility] [address] [place], [place] [address]  Phosphorus     Status: None   Collection Time: 01/26/21 11:57 AM  Result Value Ref Range   Phosphorus 4.6 2.5 - 4.6 mg/dL    Comment: Performed at  [facility] [address] [place],  [address]    No results found.  Review of Systems  Constitutional: Negative for diaphoresis, fatigue and fever.  Eyes: Negative for visual disturbance.  Respiratory: Negative for chest tightness and shortness of breath.   Cardiovascular: Negative for chest pain and palpitations.  Gastrointestinal: Negative for abdominal distention.  Genitourinary: Negative for difficulty urinating.  Neurological: Positive for dizziness and speech difficulty. Negative for syncope.   Blood pressure (!) 162/112, pulse 67, temperature 97.7 F (36.5 C), temperature source Oral, resp. rate 17, height '4\' 11"'$  (1.499 m), weight 78 kg, last menstrual period 12/23/2013, SpO2 98 %. Physical Exam Constitutional:      Appearance: Normal appearance.  HENT:     Head: Normocephalic and atraumatic.  Eyes:     Extraocular Movements: Extraocular movements intact.     Pupils: Pupils are equal, round, and reactive to light.  Neck:     Vascular: No carotid bruit.  Cardiovascular:     Rate and Rhythm: Normal rate and regular rhythm.     Heart sounds: No murmur heard. Gallop (S4 gallop noted) present.   Pulmonary:     Effort: Pulmonary effort is normal.     Breath sounds: Normal breath sounds.  Abdominal:     General: Abdomen is flat.     Palpations: Abdomen is soft.  Musculoskeletal:        General: No swelling, tenderness or deformity.     Cervical back: Normal range of motion and neck supple.  Skin:    General: Skin is warm and dry.  Neurological:     Mental Status: She is alert and oriented to person, place, and time.     Assessment/Plan: Status post hypertensive emergency with labile blood pressure and pauses probably secondary to medications asymptomatic Acute hemorrhagic infarct right pons Hypertensive heart disease Chronic kidney disease stage IV Anemia of chronic disease Tobacco abuse Marijuana abuse Plan We will switch hydralazine to BiDil 2  tablets 3 times daily Will continue low-dose labetalol for now and will uptitrate as blood pressure and heart rate tolerates Continue amlodipine for now. Patient discussed at length regarding lifestyle changes compliance with medication and follow-up and smoking and marijuana cessation No indication for pacemaker at this point as marked bradycardia and pauses asymptomatic and probably related to medication.  Charolette Forward 01/26/2021, 3:43 PM

## 2021-01-26 NOTE — Progress Notes (Signed)
STROKE TEAM PROGRESS NOTE   INTERVAL HISTORY Ongoing titration of her antihypertensive regimen continues. requring IV PRN hydralazine and labetolol, still with BP still 190s this am.   She has experienced two episodes of bradycardia this morning with concern for pauses per [name], RN. She had sensation of palpitation upon awakening from these episodes.  She denies both pre-admission chest pain and current chest pain or discomfort, nausea, dizziness. She wakes up breathless sometimes at night and has been told she snores. She reports she was told she needed to go see cardiology about her BP last fall after her October hospitalization but did not go due to financial concerns. We extensively discussed her stroke diagnosis, barriers to taking care of her health issues and need for compliance to control her risk factors.  Vital signs are stable except for intermittent bradycardia.  Neurological exam is unchanged. Vitals:   01/26/21 0100 01/26/21 0200 01/26/21 0300 01/26/21 0400  BP: (!) 145/99 (!) 161/103 (!) 149/123 (!) 146/96  Pulse: (!) 35 81 (!) 59 62  Resp: (!) 21 17    Temp:    98.4 F (36.9 C)  TempSrc:    Oral  SpO2: 98% 95% 95% 95%  Weight:      Height:       CBC:  Recent Labs  Lab 01/22/21 1042 01/22/21 1053 01/23/21 0043 01/24/21 0950  WBC 7.8  --  8.6 8.3  NEUTROABS 5.1  --   --   --   HGB 10.4*   < > 9.8* 9.2*  HCT 32.6*   < > 29.6* 28.5*  MCV 87.9  --  88.1 89.1  PLT 174  --  183 212   < > = values in this interval not displayed.   Basic Metabolic Panel:  Recent Labs  Lab 01/24/21 0950 01/25/21 0838  NA 138 137  K 3.5 4.1  CL 106 108  CO2 23 22  GLUCOSE 118* 101*  BUN 38* 39*  CREATININE 3.50* 3.86*  CALCIUM 9.5 9.4  MG 2.2  --   PHOS 3.9  --    Lipid Panel:  Recent Labs  Lab 01/22/21 1706 01/24/21 0350  CHOL 175  --   TRIG 288* 162*  HDL 48  --   CHOLHDL 3.6  --   VLDL 58*  --   LDLCALC 69  --    HgbA1c:  Recent Labs  Lab 01/22/21 1706  HGBA1C  5.3   Urine Drug Screen:  Recent Labs  Lab 01/23/21 1224  LABOPIA NONE DETECTED  COCAINSCRNUR NONE DETECTED  LABBENZ NONE DETECTED  AMPHETMU NONE DETECTED  THCU NONE DETECTED  LABBARB NONE DETECTED    Alcohol Level  Recent Labs  Lab 01/22/21 1256  ETH <10   IMAGING past 24 hours  MRI 1. Acute 8 mm hemorrhage in the right pons, likely similar in size when comparing across modalities. Associated edema throughout the surrounding pons. No obvious acute infarct, mass lesion, or vascular malformation although acute blood products and the absence of contrast limits evaluation. Repeat imaging after resolution of the hemorrhage could further evaluate if clinically indicated. 2. Age-advanced moderate chronic microvascular ischemic disease. 3. Findings suggestive multiple prior infratentorial and supratentorial microhemorrhages, described above and most likely hypertensive in etiology. 4. No large vessel occlusion or proximal hemodynamically significant stenosis intracranially.  PHYSICAL EXAM GENERAL: [name] siting up in bed eating breakfast in NAD HEENT: Neck is supple no trauma noted. CV: RRR   EXTREMITIES: No edema  BACK: Normal  SKIN: Normal by inspection.    MENTAL STATUS: Alert and oriented. Speech, language and cognition are generally intact. Judgment and insight normal.   CRANIAL NERVES: Pupils are equal, round and reactive to light and accomodation; extra ocular movements are full, there is no significant nystagmus; visual fields are full; upper and lower facial muscles revealed mild weakness of the lower left facial muscle and also mild weakness of the obicularis oculi muscle also on the left side, there is mild flattening of the nasolabial fold on the left side; tongue is midline; uvula is midline; shoulder elevation is normal.  MOTOR: There is mild weakness of the left upper extremity and left leg although direct strength testing  is normal.  Diminished fine finger movements on the left.  Orbits right over left upper extremity.  Mild left grip weakness. The other extremities show normal tone, bulk and strength; no pronator drift.  COORDINATION: Left finger to nose is normal, right finger to nose is normal, No rest tremor; no intention tremor; no postural tremor; no bradykinesia.  SENSATION: Slightly diminished touch pinprick sensation left upper and lower extremity.  Normal on the right  ASSESSMENT/PLAN Robin Navarro is a 55 y.o. female with a medical history significant for chronic kidney disease, uncontrolled hypertension, anemia, tobacco and marijuana use who presented to the ED via EMS for evaluation of slurred speech. She states that this morning at 05:30 before going to bed she had a sudden onset of sharp neck pain that radiated upward towards her right eye with a sensation of right facial swelling and slurred speech. She states that the symptoms varied in intensity with the headache subsiding with use of acetaminophen by approximately 07:30 but with persistent slurred speech. She became concerned with the ongoing speech problems and EMS was activated. On arrival, EMS noted a blood pressure of 260 / 200 and transported Robin Navarro to the ED where her blood pressure was manually obtained with a reading of 290 / 160. With her persistent slurred speech, a Code Stroke was activated by the EDP for neurology evaluation, she additionally had some mild left-sided weakness.  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 Yes with Assist; Fluid consistency: Thin    No AC/AP prior to admission  Therapy recommendations:  Outpatient PT, SLP, outpatient OT  Disposition:  Likely home with outpatient therapies when medically ready   Hypertension  Home meds: coreg, hydralazine, norvasc,  hygroton  Titrated hydralazine from 25 to '50mg'$  TID 4/25 added hydralazine prn. Dced coreg and started Lopressor '100mg'$  BID today. Discussed with Hildred Alamin, [place]. Per chart review BP meds have not been filled appropriately. Cardiology consult, Dr. Lyla Son, is pending  Had recent admission 06/2020 and 07/2020 for uncontrolled HTN with no meds x one year. Cardiology referral sent but not kept.  . BP goal 160/100 . Asymptomatic bradycardia to 50s a few times since admission and now to 30s.  Marland Kitchen Avoid nephrotoxic agents  . TOC consulted for medication assistance and PCP options.  . Extensively counseled regarding need to control hypertension.   Hyperlipidemia  Home meds:  none  LDL 69, at goal < 70  CKD  Ctn 3.64->3.5->3.86  Her baseline is unclear, but has been 3.5-3.9 since last Sep/Oct 2021 on checks here  Avoid nephrotoxic agents   Other Stroke Risk Factors  Cigar smoker, advised to stop smoking  ETOH use, alcohol level <10, advised to drink no more than 1 drink(s) a day  Substance abuse - THC. Advised to abstain.   Obesity, Body mass index is 34.74 kg/m., BMI >/= 30 associated with increased stroke risk, recommend weight loss, diet and exercise as appropriate   Hospital day # 4 Recommend change Coreg to labetalol 100 mg twice daily for better blood pressure control.  Transfer to neurology floor bed available.  Cardiology consult for bradycardia.  Greater than 50% time during this 35-minute visit was spent on counseling and coordination of care discussion with care team and answering questions.  Antony Contras, MD   To contact Stroke Continuity provider, please refer to [email or web address] After hours, contact General Neurology

## 2021-01-27 ENCOUNTER — Other Ambulatory Visit (HOSPITAL_COMMUNITY): Payer: Self-pay

## 2021-01-27 MED ORDER — HYDRALAZINE HCL 25 MG PO TABS
75.0000 mg | ORAL_TABLET | Freq: Three times a day (TID) | ORAL | 3 refills | Status: AC
  Filled 2021-01-27: qty 270, 30d supply, fill #0

## 2021-01-27 MED ORDER — LABETALOL HCL 100 MG PO TABS
100.0000 mg | ORAL_TABLET | Freq: Two times a day (BID) | ORAL | 2 refills | Status: AC
  Filled 2021-01-27: qty 60, 30d supply, fill #0

## 2021-01-27 MED ORDER — ISOSORBIDE DINITRATE 20 MG PO TABS
20.0000 mg | ORAL_TABLET | ORAL | 3 refills | Status: AC
  Filled 2021-01-27: qty 120, 20d supply, fill #0

## 2021-01-27 MED ORDER — AMLODIPINE BESYLATE 10 MG PO TABS
10.0000 mg | ORAL_TABLET | Freq: Every day | ORAL | 3 refills | Status: AC
  Filled 2021-01-27: qty 30, 30d supply, fill #0

## 2021-01-27 MED ORDER — ISOSORB DINITRATE-HYDRALAZINE 20-37.5 MG PO TABS
2.0000 | ORAL_TABLET | Freq: Three times a day (TID) | ORAL | 2 refills | Status: AC
  Filled 2021-01-27: qty 180, 30d supply, fill #0

## 2021-01-27 NOTE — Progress Notes (Signed)
Subjective:  Patient denies any chest pain or shortness of breath.  Denies headache.  Blood pressure is better controlled after switching hydralazine to BiDil.  Objective:  Vital Signs in the last 24 hours: Temp:  [98 F (36.7 C)-98.3 F (36.8 C)] 98.3 F (36.8 C) (04/27 0400) Pulse Rate:  [66-94] 77 (04/27 1100) Resp:  [14-28] 20 (04/27 1100) BP: (142-171)/(75-112) 145/75 (04/27 1100) SpO2:  [91 %-100 %] 98 % (04/27 1100)  Intake/Output from previous day: No intake/output data recorded. Intake/Output from this shift: No intake/output data recorded.  Physical Exam: Exam unchanged  Lab Results: No results for input(s): WBC, HGB, PLT in the last 72 hours. Recent Labs    01/25/21 0838 01/26/21 1157  NA 137 139  K 4.1 4.6  CL 108 108  CO2 22 22  GLUCOSE 101* 96  BUN 39* 43*  CREATININE 3.86* 3.82*   No results for input(s): TROPONINI in the last 72 hours.  Invalid input(s): CK, MB Hepatic Function Panel No results for input(s): PROT, ALBUMIN, AST, ALT, ALKPHOS, BILITOT, BILIDIR, IBILI in the last 72 hours. No results for input(s): CHOL in the last 72 hours. No results for input(s): PROTIME in the last 72 hours.  Imaging: Imaging results have been reviewed and No results found.  Cardiac Studies:  Assessment/Plan:  Status post hypertensive emergency with labile blood pressure and pauses probably secondary to medications asymptomatic Acute hemorrhagic infarct right pons Hypertensive heart disease Chronic kidney disease stage IV Anemia of chronic disease Tobacco abuse Marijuana abuse  Plan Continue present management I will sign off Please call if needed Follow-up with me in 2 weeks/as needed  LOS: 5 days    Charolette Forward 01/27/2021, 12:04 PM

## 2021-01-27 NOTE — Progress Notes (Signed)
Provided discharge paperwork to patient and answered all questions to satisfaction. Patient currently awaiting ride home.

## 2021-01-27 NOTE — Progress Notes (Signed)
Physical Therapy Treatment Patient Details Name: Robin Navarro MRN: [id number] DOB: 1965-12-19 Today's Date: 01/27/2021    History of Present Illness Pt is a 55 y.o. F who presents with slurred speech and mild left sided weakness. 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 Significant PMH: CKD, uncontrolled HTN, anemia, tobacco and marijuana use.    PT Comments    Pt met her physical therapy goals during her inpatient stay. Ambulating a block around unit without an assistive device; negotiated x 12 steps with a right railing at a supervision level. Cues provided for step by step pattern for energy conservation. HR peak 114 bpm. Instructed pt on HEP including mini squats, calf raises and hamstring curls (handout provided) in addition to walking program. Pt verbalized understanding and has no further questions. Recommending OPPT to address continued decreased LUE coordination, decreased gait speed, and decreased endurance.    Follow Up Recommendations  Supervision - Intermittent;Outpatient PT     Equipment Recommendations  None recommended by PT    Recommendations for Other Services       Precautions / Restrictions Precautions Precautions: Other (comment) Precaution Comments: BP <160/ < 100 Restrictions Weight Bearing Restrictions: No    Mobility  Bed Mobility Overal bed mobility: Independent                  Transfers Overall transfer level: Independent Equipment used: None                Ambulation/Gait Ambulation/Gait assistance: Modified independent (Device/Increase time) Gait Distance (Feet): 250 Feet Assistive device: None Gait Pattern/deviations: Step-through pattern;Decreased stride length Gait velocity: 1.4 ft/x Gait velocity interpretation: <1.8 ft/sec, indicate of risk for recurrent falls General Gait Details: Slow and steady pace   Stairs Stairs: Yes Stairs assistance: Supervision Stair Management: One rail Right Number of  Stairs: 12 General stair comments: Cues for step by step for energy conservation   Wheelchair Mobility    Modified Rankin (Stroke Patients Only) Modified Rankin (Stroke Patients Only) Pre-Morbid Rankin Score: No symptoms Modified Rankin: Moderately severe disability     Balance Overall balance assessment: Mild deficits observed, not formally tested                                          Cognition Arousal/Alertness: Awake/alert Behavior During Therapy: Flat affect Overall Cognitive Status: Within Functional Limits for tasks assessed                                        Exercises Other Exercises Other Exercises: Standing: squats, calf raises, hamstring curls x 10 each    General Comments        Pertinent Vitals/Pain Pain Assessment: No/denies pain    Home Living                      Prior Function            PT Goals (current goals can now be found in the care plan section) Acute Rehab PT Goals Patient Stated Goal: return to baseline PT Goal Formulation: With patient Time For Goal Achievement: 02/06/21 Potential to Achieve Goals: Good Progress towards PT goals: Progressing toward goals    Frequency    Min 4X/week      PT Plan Other (comment) (  d/c therapies)    Co-evaluation              AM-PAC PT "6 Clicks" Mobility   Outcome Measure  Help needed turning from your back to your side while in a flat bed without using bedrails?: None Help needed moving from lying on your back to sitting on the side of a flat bed without using bedrails?: None Help needed moving to and from a bed to a chair (including a wheelchair)?: None Help needed standing up from a chair using your arms (e.g., wheelchair or bedside chair)?: None Help needed to walk in hospital room?: None Help needed climbing 3-5 steps with a railing? : A Little 6 Click Score: 23    End of Session   Activity Tolerance: Patient tolerated  treatment well Patient left: with call bell/phone within reach;in bed Nurse Communication: Mobility status PT Visit Diagnosis: Unsteadiness on feet (R26.81)     Time: 5364-6803 PT Time Calculation (min) (ACUTE ONLY): 20 min  Charges:  $Therapeutic Activity: 8-22 mins                     Wyona Almas, PT, DPT [place] Pager [phone] Office [phone]    Deno Etienne 01/27/2021, 10:37 AM

## 2021-01-27 NOTE — Discharge Summary (Addendum)
Stroke Discharge Summary  Patient ID: Robin Navarro   MRN: [id number]      DOB: 1966-03-28  Date of Admission: 01/22/2021 Date of Discharge: 01/27/2021  Attending Physician:  Dr. Antony Contras,  Stroke MD Consultant(s):   Treatment Team:  Charolette Forward, MD  Patient's PCP:  [name], MD  DISCHARGE DIAGNOSIS: 1. 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 Uncontrolled hypertension with medication non-compliance 3. Asymptomatic bradycardia 4. Substance abuse : THC, cigarette smoker  5. HLD 6. Concern for sleep apnea  7. Inadequate financial resources: uninsured, unable to afford medications at times  LABORATORY STUDIES CBC    Component Value Date/Time   WBC 8.3 01/24/2021 0950   RBC 3.20 (L) 01/24/2021 0950   HGB 9.2 (L) 01/24/2021 0950   HCT 28.5 (L) 01/24/2021 0950   PLT 212 01/24/2021 0950   MCV 89.1 01/24/2021 0950   MCH 28.8 01/24/2021 0950   MCHC 32.3 01/24/2021 0950   RDW 18.3 (H) 01/24/2021 0950   LYMPHSABS 2.1 01/22/2021 1042   MONOABS 0.4 01/22/2021 1042   EOSABS 0.1 01/22/2021 1042   BASOSABS 0.0 01/22/2021 1042   CMP    Component Value Date/Time   NA 139 01/26/2021 1157   K 4.6 01/26/2021 1157   CL 108 01/26/2021 1157   CO2 22 01/26/2021 1157   GLUCOSE 96 01/26/2021 1157   BUN 43 (H) 01/26/2021 1157   CREATININE 3.82 (H) 01/26/2021 1157   CALCIUM 9.4 01/26/2021 1157   PROT 7.6 01/22/2021 1042   ALBUMIN 3.8 01/22/2021 1042   AST 30 01/22/2021 1042   ALT 16 01/22/2021 1042   ALKPHOS 77 01/22/2021 1042   BILITOT 0.7 01/22/2021 1042   GFRNONAA 14 (L) 01/26/2021 1157   GFRAA 23 (L) 06/15/2020 0325   COAGS Lab Results  Component Value Date   INR 1.0 01/22/2021   Lipid Panel    Component Value Date/Time   CHOL 175 01/22/2021 1706   TRIG 162 (H) 01/24/2021 0350   HDL 48 01/22/2021 1706   CHOLHDL 3.6 01/22/2021 1706   VLDL 58 (H) 01/22/2021 1706   LDLCALC 69 01/22/2021 1706   HgbA1C  Lab Results   Component Value Date   HGBA1C 5.3 01/22/2021   Urinalysis    Component Value Date/Time   COLORURINE YELLOW 01/23/2021 1224   APPEARANCEUR HAZY (A) 01/23/2021 1224   LABSPEC 1.013 01/23/2021 1224   PHURINE 5.0 01/23/2021 1224   GLUCOSEU NEGATIVE 01/23/2021 1224   HGBUR NEGATIVE 01/23/2021 1224   BILIRUBINUR NEGATIVE 01/23/2021 1224   KETONESUR NEGATIVE 01/23/2021 1224   PROTEINUR 100 (A) 01/23/2021 1224   UROBILINOGEN 0.2 01/06/2014 1848   NITRITE NEGATIVE 01/23/2021 1224   LEUKOCYTESUR NEGATIVE 01/23/2021 1224   Urine Drug Screen     Component Value Date/Time   LABOPIA NONE DETECTED 01/23/2021 1224   COCAINSCRNUR NONE DETECTED 01/23/2021 1224   LABBENZ NONE DETECTED 01/23/2021 1224   AMPHETMU NONE DETECTED 01/23/2021 1224   THCU NONE DETECTED 01/23/2021 1224   LABBARB NONE DETECTED 01/23/2021 1224    Alcohol Level    Component Value Date/Time   ETH <10 01/22/2021 1256     SIGNIFICANT DIAGNOSTIC STUDIES MR MRA HEAD WO CONTRAST  Result Date: 01/22/2021 EXAM: MRI HEAD WITHOUT CONTRAST MRA HEAD WITHOUT CONTRAST TECHNIQUE: Multiplanar, multiecho pulse sequences of the brain and surrounding structures were obtained without intravenous contrast. Angiographic images of the head were obtained using MRA technique without contrast. COMPARISON:  None. FINDINGS: MRI  HEAD FINDINGS Brain: Redemonstrated approximately 8 mm acute hemorrhage in the right pons which demonstrates characteristic T2 hypointensity and susceptibility artifact, likely similar in size when comparing across modalities. There is surrounding edema throughout the pons. No acute infarct. No obvious mass lesion although acute blood products in the absence of contrast limits evaluation. Multiple additional small foci susceptibility artifact in the pons (separate from the primary hemorrhage), bilateral cerebellar hemispheres, right basal ganglia, right thalamus, right para hippocampal region, right periventricular white  matter, and left parietal white matter without associated edema, likely the sequela of prior microhemorrhages. No hydrocephalus. No midline shift. Age advanced patchy T2/FLAIR hyperintensities within the white matter, nonspecific but most likely related to chronic microvascular ischemic disease given the patient's risk factors. Vascular: Evaluated below. Skull and upper cervical spine: Diffusely heterogeneous and T1 hypointense marrow signal which likely relates to the patient's known anemia and/or renal osteodystrophy in the setting of chronic kidney disease. Sinuses/Orbits: Clear sinuses.  Unremarkable orbits. Other: No mastoid effusions MRA HEAD FINDINGS Anterior circulation: No large vessel occlusion, proximal hemodynamically significant stenosis, or aneurysm. Posterior circulation: No large vessel occlusion, proximal hemodynamically significant stenosis, aneurysm, or convincing vascular malformation. There is minimal hyperintensity in the area of pontine hemorrhage on the time-of-flight which is thought to be too faint to represent an underlying vascular malformation and may be secondary to blood products rather than true flow. IMPRESSION: 1. Acute 8 mm hemorrhage in the right pons, likely similar in size when comparing across modalities. Associated edema throughout the surrounding pons. No obvious acute infarct, mass lesion, or vascular malformation although acute blood products and the absence of contrast limits evaluation. Repeat imaging after resolution of the hemorrhage could further evaluate if clinically indicated. 2. Age-advanced moderate chronic microvascular ischemic disease. 3. Findings suggestive multiple prior infratentorial and supratentorial microhemorrhages, described above and most likely hypertensive in etiology. 4. No large vessel occlusion or proximal hemodynamically significant stenosis intracranially. Electronically Signed   By: Margaretha Sheffield MD   On: 01/22/2021 17:37   MR ANGIO NECK  WO CONTRAST  Result Date: 01/22/2021 CLINICAL DATA:  Stroke follow-up. EXAM: MRA NECK WITHOUT CONTRAST TECHNIQUE: Angiographic images of the neck were obtained using MRA technique without intravenous contrast. Carotid stenosis measurements (when applicable) are obtained utilizing NASCET criteria, using the distal internal carotid diameter as the denominator. COMPARISON:  None. FINDINGS: Carotid arteries: Evaluation is limited by motion. Bilateral common carotid arteries and internal carotid arteries are patent without evidence of significant stenosis. Mild narrowing of the left ICA at the skull base. Retropharyngeal course of the left carotid. Tortuous internal carotid arteries bilaterally bilaterally. Vertebral arteries: Codominant. Bilateral vertebral arteries are patent without evidence of significant stenosis. Tortuous bilaterally. IMPRESSION: 1. No evidence of significant stenosis on this motion limited MRA. 2. Tortuous vessels in the neck, likely secondary to the patient's known hypertension. Electronically Signed   By: Margaretha Sheffield MD   On: 01/22/2021 17:45   MR BRAIN WO CONTRAST  Result Date: 01/22/2021 EXAM: MRI HEAD WITHOUT CONTRAST MRA HEAD WITHOUT CONTRAST TECHNIQUE: Multiplanar, multiecho pulse sequences of the brain and surrounding structures were obtained without intravenous contrast. Angiographic images of the head were obtained using MRA technique without contrast. COMPARISON:  None. FINDINGS: MRI HEAD FINDINGS Brain: Redemonstrated approximately 8 mm acute hemorrhage in the right pons which demonstrates characteristic T2 hypointensity and susceptibility artifact, likely similar in size when comparing across modalities. There is surrounding edema throughout the pons. No acute infarct. No obvious mass lesion although acute blood products  in the absence of contrast limits evaluation. Multiple additional small foci susceptibility artifact in the pons (separate from the primary hemorrhage),  bilateral cerebellar hemispheres, right basal ganglia, right thalamus, right para hippocampal region, right periventricular white matter, and left parietal white matter without associated edema, likely the sequela of prior microhemorrhages. No hydrocephalus. No midline shift. Age advanced patchy T2/FLAIR hyperintensities within the white matter, nonspecific but most likely related to chronic microvascular ischemic disease given the patient's risk factors. Vascular: Evaluated below. Skull and upper cervical spine: Diffusely heterogeneous and T1 hypointense marrow signal which likely relates to the patient's known anemia and/or renal osteodystrophy in the setting of chronic kidney disease. Sinuses/Orbits: Clear sinuses.  Unremarkable orbits. Other: No mastoid effusions MRA HEAD FINDINGS Anterior circulation: No large vessel occlusion, proximal hemodynamically significant stenosis, or aneurysm. Posterior circulation: No large vessel occlusion, proximal hemodynamically significant stenosis, aneurysm, or convincing vascular malformation. There is minimal hyperintensity in the area of pontine hemorrhage on the time-of-flight which is thought to be too faint to represent an underlying vascular malformation and may be secondary to blood products rather than true flow. IMPRESSION: 1. Acute 8 mm hemorrhage in the right pons, likely similar in size when comparing across modalities. Associated edema throughout the surrounding pons. No obvious acute infarct, mass lesion, or vascular malformation although acute blood products and the absence of contrast limits evaluation. Repeat imaging after resolution of the hemorrhage could further evaluate if clinically indicated. 2. Age-advanced moderate chronic microvascular ischemic disease. 3. Findings suggestive multiple prior infratentorial and supratentorial microhemorrhages, described above and most likely hypertensive in etiology. 4. No large vessel occlusion or proximal  hemodynamically significant stenosis intracranially. Electronically Signed   By: Margaretha Sheffield MD   On: 01/22/2021 17:37   ECHOCARDIOGRAM COMPLETE  Result Date: 01/23/2021    ECHOCARDIOGRAM REPORT   Patient Name:   Robin Navarro Date of Exam: 01/23/2021 Medical Rec #:  [id number]          Height:       59.0 in Accession #:    [id number]         Weight:       192.1 lb Date of Birth:  03/21/1966           BSA:          1.813 m Patient Age:    21 years           BP:           161/94 mmHg Patient Gender: F                  HR:           89 bpm. Exam Location:  Inpatient Procedure: 2D Echo, 3D Echo, Cardiac Doppler, Color Doppler and Strain Analysis Indications:    Stroke I63.9  History:        Patient has prior history of Echocardiogram examinations, most                 recent 07/20/2020. Risk Factors:Hypertension. Chronic kidney                 disease, anemia, hypertensive emergency.  Sonographer:    Darlina Sicilian RDCS Referring Phys: [id number] [place]  1. Left ventricular ejection fraction, by estimation, is 60 to 65%. The left ventricle has normal function. The left ventricle has no regional wall motion abnormalities. There is moderate concentric left ventricular hypertrophy. Left ventricular diastolic parameters are indeterminate. Elevated left ventricular end-diastolic  pressure. Global longitudinal strain performed but not reported based on interpreter judgement due to suboptimal tracking.  2. Right ventricular systolic function is normal. The right ventricular size is normal. Tricuspid regurgitation signal is inadequate for assessing PA pressure.  3. The mitral valve is normal in structure. No evidence of mitral valve regurgitation. No evidence of mitral stenosis.  4. The aortic valve is tricuspid. Aortic valve regurgitation is not visualized. Mild aortic valve sclerosis is present, with no evidence of aortic valve stenosis.  5. The inferior vena cava is normal in size with <50%  respiratory variability, suggesting right atrial pressure of 8 mmHg. FINDINGS  Left Ventricle: Left ventricular ejection fraction, by estimation, is 60 to 65%. The left ventricle has normal function. The left ventricle has no regional wall motion abnormalities. Global longitudinal strain performed but not reported based on interpreter judgement due to suboptimal tracking. The left ventricular internal cavity size was normal in size. There is moderate concentric left ventricular hypertrophy. Left ventricular diastolic parameters are indeterminate. Elevated left ventricular end-diastolic pressure. Right Ventricle: The right ventricular size is normal. No increase in right ventricular wall thickness. Right ventricular systolic function is normal. Tricuspid regurgitation signal is inadequate for assessing PA pressure. Left Atrium: Left atrial size was normal in size. Right Atrium: Right atrial size was normal in size. Pericardium: There is no evidence of pericardial effusion. Mitral Valve: The mitral valve is normal in structure. No evidence of mitral valve regurgitation. No evidence of mitral valve stenosis. Tricuspid Valve: The tricuspid valve is normal in structure. Tricuspid valve regurgitation is mild . No evidence of tricuspid stenosis. Aortic Valve: The aortic valve is tricuspid. Aortic valve regurgitation is not visualized. Mild aortic valve sclerosis is present, with no evidence of aortic valve stenosis. Pulmonic Valve: The pulmonic valve was normal in structure. Pulmonic valve regurgitation is not visualized. No evidence of pulmonic stenosis. Aorta: The aortic root is normal in size and structure. Venous: The inferior vena cava is normal in size with less than 50% respiratory variability, suggesting right atrial pressure of 8 mmHg. IAS/Shunts: No atrial level shunt detected by color flow Doppler.  LEFT VENTRICLE PLAX 2D LVIDd:         4.90 cm  Diastology LVIDs:         2.70 cm  LV e' medial:    4.88 cm/s LV  PW:         1.20 cm  LV E/e' medial:  19.5 LV IVS:        1.20 cm  LV e' lateral:   12.44 cm/s LVOT diam:     1.70 cm  LV E/e' lateral: 7.6 LV SV:         57 LV SV Index:   31 LVOT Area:     2.27 cm  RIGHT VENTRICLE RV S prime:     21.20 cm/s TAPSE (M-mode): 3.5 cm LEFT ATRIUM             Index       RIGHT ATRIUM           Index LA diam:        3.90 cm 2.15 cm/m  RA Area:     12.60 cm LA Vol (A2C):   45.5 ml 25.10 ml/m RA Volume:   27.50 ml  15.17 ml/m LA Vol (A4C):   51.0 ml 28.13 ml/m LA Biplane Vol: 56.0 ml 30.89 ml/m  AORTIC VALVE LVOT Vmax:   164.00 cm/s LVOT Vmean:  105.000 cm/s LVOT VTI:  0.250 m  AORTA Ao Root diam: 2.90 cm Ao Asc diam:  2.50 cm MITRAL VALVE MV Area (PHT): 2.99 cm     SHUNTS MV Decel Time: 254 msec     Systemic VTI:  0.25 m MV E velocity: 95.10 cm/s   Systemic Diam: 1.70 cm MV A velocity: 101.00 cm/s MV E/A ratio:  0.94 Fransico Him MD Electronically signed by Fransico Him MD Signature Date/Time: 01/23/2021/11:17:03 AM    Final    CT HEAD CODE STROKE WO CONTRAST  Result Date: 01/22/2021 CLINICAL DATA:  Code stroke. Neuro facet with acute stroke suspected. EXAM: CT HEAD WITHOUT CONTRAST TECHNIQUE: Contiguous axial images were obtained from the base of the skull through the vertex without intravenous contrast. COMPARISON:  None. FINDINGS: Brain: High-density in the right ventral pons most consistent with hemorrhage. The density is brighter than would be expected for a hyperdense mass such as cavernoma. Dimensions are 8 mm and there is no detected adjacent brain edema or mass. No detected infarct, hydrocephalus, collection, or cerebral mass. Brain volume is normal. Mild patchy white matter low-density, usually chronic small vessel ischemia given patient history. There is history of hypertension and patient is markedly hypertensive currently Vascular: No hyperdense vessel or unexpected calcification. 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 ASPECTS [facility] Stroke Program Early CT Score) Not scored in this setting IMPRESSION: 1. Subcentimeter acute hemorrhage in the right pons. 2. Chronic white matter disease presumably from patient's hypertension. Electronically Signed   By: Monte Fantasia M.D.   On: 01/22/2021 11:03   HISTORY OF PRESENT ILLNESS Robin Navarro is a 55 y.o. female with a medical history significant for chronic kidney disease, uncontrolled hypertension, anemia, tobacco and marijuana use who presented to the ED via EMS for evaluation of slurred speech. She states that this morning at 05:30 before going to bed she had a sudden onset of sharp neck pain that radiated upward towards her right eye with a sensation of right facial swelling and slurred speech. She states that the symptoms varied in intensity with the headache subsiding with use of acetaminophen by approximately 07:30 but with persistent slurred speech. She became concerned with the ongoing speech problems and EMS was activated. On arrival, EMS noted a blood pressure of 260 / 200 and transported Robin Navarro to the ED where her blood pressure was manually obtained with a reading of 290 / 160. With her persistent slurred speech, a Code Stroke was activated by the EDP for neurology evaluation, she additionally had some mild left-sided weakness.   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 Yes with Assist; Fluid consistency: Thin       No AC/AP prior to admission Therapy recommendations:  Outpatient PT, SLP, outpatient OT Disposition:  Likely home with outpatient therapies when medically ready    Hypertension Home meds: coreg, hydralazine,  norvasc, hygroton Titrated hydralazine from 25 to '50mg'$  TID 4/25 added hydralazine prn. Dced coreg and started Lopressor '100mg'$  BID today. Discussed with Hildred Alamin, [place]. Per chart review BP meds have not been filled appropriately. Cardiology consult, Dr. Lyla Son, was called. Dr. Lyla Son recommended changes to the  Regimen: Continue  amlodipine '10mg'$  for now. We will switch hydralazine to BiDil 20-37.5 2 tablets 3 times daily. Will continue low-dose labetalol '100mg'$  BID for now and will uptitrate as blood pressure and heart rate tolerates.    Had recent admission 06/2020 and 07/2020 for uncontrolled HTN with no meds x one year. Cardiology referral sent but not kept.  BP goal 160/100 Asymptomatic bradycardia to 50s a few times since admission and now to 30s. Felt to be transient medication effects.  Avoid nephrotoxic agents  TOC consulted for medication assistance and PCP options.  Extensively counseled regarding need to control hypertension.    Hyperlipidemia Home meds:  none LDL 69, at goal < 70   CKD Ctn 3.64->3.5->3.86 Her baseline is unclear, but has been 3.5-3.9 since last Sep/Oct 2021 on checks here Avoid nephrotoxic agents    Other Stroke Risk Factors Cigar smoker, advised to stop smoking ETOH use, alcohol level <10, advised to drink no more than 1 drink(s) a day Substance abuse - THC. Advised to abstain.  Obesity, Body mass index is 34.74 kg/m., BMI >/= 30 associated with increased stroke risk, recommend weight loss, diet and exercise as appropriate    Robin Navarro is a 55 y.o. female with a medical history significant for chronic kidney disease, uncontrolled hypertension, anemia, tobacco and marijuana use who presented to the ED via EMS for evaluation of slurred speech. She states that this morning at 05:30 before going to bed she had a sudden onset of sharp neck pain that radiated upward towards her right eye with a sensation of right facial swelling and slurred speech.  She states that the symptoms varied in intensity with the headache subsiding with use of acetaminophen by approximately 07:30 but with persistent slurred speech. She became concerned with the ongoing speech problems and EMS was activated. On arrival, EMS noted a blood pressure of 260 / 200 and transported Robin Navarro to the ED where her blood pressure was manually obtained with a reading of 290 / 160. With her persistent slurred speech, a Code Stroke was activated by the EDP for neurology evaluation, she additionally had some mild left-sided weakness.   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 Yes with Assist; Fluid consistency: Thin        No AC/AP prior to admission Therapy recommendations:  Outpatient PT, SLP, outpatient OT Disposition:  Likely home with outpatient therapies when medically ready    Hypertension Home meds: coreg, hydralazine, norvasc, hygroton Titrated hydralazine from 25 to '50mg'$  TID 4/25 added hydralazine prn. Dced coreg and started Lopressor '100mg'$  BID today. Discussed with Hildred Alamin, [place]. Per chart review BP meds have not been filled appropriately. Cardiology consult, Dr. Lyla Son, is pending Had recent admission 06/2020 and 07/2020 for uncontrolled HTN with no meds x one year. Cardiology referral sent but not kept.  BP goal 160/100 Asymptomatic bradycardia to 50s a few times since admission and now to 30s.  Avoid nephrotoxic agents  TOC consulted for medication assistance and PCP options.  Extensively counseled regarding need to control hypertension.    Hyperlipidemia Home meds:  none LDL 69, at goal < 70   CKD Ctn 3.64->3.5->3.86 Her baseline is unclear, but has been 3.5-3.9 since last Sep/Oct 2021 on checks here Avoid nephrotoxic agents    Other Stroke Risk Factors Cigar smoker, advised to stop  smoking ETOH use, alcohol level  <10, advised to drink no more than 1 drink(s) a day Substance abuse - THC. Advised to abstain.  Obesity, Body mass index is 34.74 kg/m., BMI >/= 30 associated with increased stroke risk, recommend weight loss, diet and exercise as appropriate  Possible sleep apnea   DISCHARGE EXAM Blood pressure (!) 145/75, pulse 77, temperature 98.3 F (36.8 C), temperature source Oral, resp. rate 20, height '4\' 11"'$  (1.499 m), weight 78 kg, last menstrual period 12/23/2013, SpO2 98 %. GENERAL: [name] siting up in bed eating breakfast in NAD HEENT: Neck is supple no trauma noted. CV: RRR  EXTREMITIES: No edema  BACK: Normal SKIN: Normal by inspection.    MENTAL STATUS: Alert and oriented. Speech, language and cognition are generally intact. Judgment and insight normal. Anxious for discharge.    CRANIAL NERVES: Pupils are equal, round and reactive to light and accomodation; extra ocular movements are full, there is no significant nystagmus; visual fields are full; upper and lower facial muscles revealed mild weakness of the lower left facial muscle and also mild weakness of the obicularis oculi muscle also on the left side, there is mild flattening of the nasolabial fold on the left side; tongue is midline; uvula is midline; shoulder elevation is normal.   MOTOR: There is mild weakness of the left upper extremity and left leg although direct strength testing is normal.  Diminished fine finger movements on the left.  Orbits right over left upper extremity.  Mild left grip weakness. The other extremities show normal tone, bulk and strength; no pronator drift.   COORDINATION: Left finger to nose is normal, right finger to nose is normal, No rest tremor; no intention tremor; no postural tremor; no bradykinesia.   SENSATION: Slightly diminished touch pinprick sensation left upper and lower extremity.  Normal on the right  Discharge Diet       Diet   Diet Heart Room service  appropriate? Yes with Assist; Fluid consistency: Thin   liquids  Discharge Instructions     Ambulatory referral to Neurology   Complete by: As directed    An appointment is requested in approximately: 4 weeks   Ambulatory referral to Occupational Therapy   Complete by: As directed    Ambulatory referral to Physical Therapy   Complete by: As directed    Ambulatory referral to Speech Therapy   Complete by: As directed       DISCHARGE PLAN Disposition:  Home  Ongoing stroke risk factor control by Primary Care Physician at time of discharge Follow-up PCP [name], MD in 2 weeks. Follow-up in [facility] in 4 weeks, office to schedule an appointment.  Cardiology follow up per Dr. Terrence Dupont  32 minutes were spent preparing discharge.  Delila A Bailey-Modzik, NP-C I have personally obtained history,examined this patient, reviewed notes, independently viewed imaging studies, participated in medical decision making and plan of care.ROS completed by me personally and pertinent positives fully documented  I have made any additions or clarifications directly to the above note. Agree with note above.    Antony Contras, MD Medical Director [facility] Pager: [phone] 01/28/2021 1:29 PM

## 2021-01-27 NOTE — TOC Transition Note (Signed)
Transition of Care [facility]) - CM/SW Discharge Note   Patient Details  Name: Robin Navarro MRN: [id number] Date of Birth: [birth year]  Transition of Care [facility]) CM/SW Contact:  Ella Bodo, RN Phone Number: 01/27/2021, 12:41 PM   Clinical Narrative:  Pt medically stable for discharge home today with family to assist.  DC Rx sent to [facility] to be filled using [place] letter; [facility] pharmacy to deliver Rx to bedside.  OP therapy referrals have been made for continued PT/OT and ST.      Final next level of care: OP Rehab Barriers to Discharge: Barriers Resolved   Patient Goals and CMS Choice Patient states their goals for this hospitalization and ongoing recovery are:: to go home                            Discharge Plan and Services   Discharge Planning Services: CM Consult,Follow-up appt [facility] Program                                 Social Determinants of Health (SDOH) Interventions     Readmission Risk Interventions Readmission Risk Prevention Plan 01/27/2021  Transportation Screening Complete  PCP or Specialist Appt within 5-7 Days Complete  Home Care Screening Complete  Medication Review (RN CM) Complete  Some recent data might be hidden   Reinaldo Raddle, RN, BSN  Trauma/Neuro ICU Case Manager [phone]

## 2021-01-27 NOTE — Progress Notes (Signed)
STROKE TEAM PROGRESS NOTE   STROKE TEAM PROGRESS NOTE   INTERVAL HISTORY Ongoing titration of her antihypertensive regimen continues. requring IV PRN hydralazine and labetolol, still with BP still 190s this am.   She has experienced two episodes of bradycardia this morning with concern for pauses per Robin Schwartz, RN. She had sensation of palpitation upon awakening from these episodes.  She denies both pre-admission chest pain and current chest pain or discomfort, nausea, dizziness. She wakes up breathless sometimes at night and has been told she snores. She reports she was told she needed to go see cardiology about her BP last fall after her October hospitalization but did not go due to financial concerns. We extensively discussed her stroke diagnosis, barriers to taking care of her health issues and need for compliance to control her risk factors.  Vital signs are stable except for intermittent bradycardia.  Neurological exam is unchanged. Vitals:   01/27/21 0400 01/27/21 0500 01/27/21 0600 01/27/21 0700  BP: (!) 142/77 (!) 156/83 (!) 145/75   Pulse:  72  69  Resp: (!) '22 15 19 15  '$ Temp: 98.3 F (36.8 C)     TempSrc: Oral     SpO2:  100%  96%  Weight:      Height:       CBC:  Recent Labs  Lab 01/22/21 1042 01/22/21 1053 01/23/21 0043 01/24/21 0950  WBC 7.8  --  8.6 8.3  NEUTROABS 5.1  --   --   --   HGB 10.4*   < > 9.8* 9.2*  HCT 32.6*   < > 29.6* 28.5*  MCV 87.9  --  88.1 89.1  PLT 174  --  183 212   < > = values in this interval not displayed.   Basic Metabolic Panel:  Recent Labs  Lab 01/24/21 0950 01/25/21 0838 01/26/21 1157  NA 138 137 139  K 3.5 4.1 4.6  CL 106 108 108  CO2 '23 22 22  '$ GLUCOSE 118* 101* 96  BUN 38* 39* 43*  CREATININE 3.50* 3.86* 3.82*  CALCIUM 9.5 9.4 9.4  MG 2.2  --  2.0  PHOS 3.9  --  4.6   Lipid Panel:  Recent Labs  Lab 01/22/21 1706 01/24/21 0350  CHOL 175  --   TRIG 288* 162*  HDL 48  --   CHOLHDL 3.6  --   VLDL 58*  --   LDLCALC 69   --    HgbA1c:  Recent Labs  Lab 01/22/21 1706  HGBA1C 5.3   Urine Drug Screen:  Recent Labs  Lab 01/23/21 1224  LABOPIA NONE DETECTED  COCAINSCRNUR NONE DETECTED  LABBENZ NONE DETECTED  AMPHETMU NONE DETECTED  THCU NONE DETECTED  LABBARB NONE DETECTED    Alcohol Level  Recent Labs  Lab 01/22/21 1256  ETH <10   IMAGING past 24 hours  MRI 1. Acute 8 mm hemorrhage in the right pons, likely similar in size when comparing across modalities. Associated edema throughout the surrounding pons. No obvious acute infarct, mass lesion, or vascular malformation although acute blood products and the absence of contrast limits evaluation. Repeat imaging after resolution of the hemorrhage could further evaluate if clinically indicated. 2. Age-advanced moderate chronic microvascular ischemic disease. 3. Findings suggestive multiple prior infratentorial and supratentorial microhemorrhages, described above and most likely hypertensive in etiology. 4. No large vessel occlusion or proximal hemodynamically significant stenosis intracranially.  PHYSICAL EXAM GENERAL: [name] siting up in bed eating breakfast in NAD HEENT:  Neck is supple no trauma noted. CV: RRR   EXTREMITIES: No edema   BACK: Normal  SKIN: Normal by inspection.    MENTAL STATUS: Alert and oriented. Speech, language and cognition are generally intact. Judgment and insight normal.   CRANIAL NERVES: Pupils are equal, round and reactive to light and accomodation; extra ocular movements are full, there is no significant nystagmus; visual fields are full; upper and lower facial muscles revealed mild weakness of the lower left facial muscle and also mild weakness of the obicularis oculi muscle also on the left side, there is mild flattening of the nasolabial fold on the left side; tongue is midline; uvula is midline; shoulder elevation is normal.  MOTOR: There is mild weakness of the left  upper extremity and left leg although direct strength testing is normal.  Diminished fine finger movements on the left.  Orbits right over left upper extremity.  Mild left grip weakness. The other extremities show normal tone, bulk and strength; no pronator drift.  COORDINATION: Left finger to nose is normal, right finger to nose is normal, No rest tremor; no intention tremor; no postural tremor; no bradykinesia.  SENSATION: Slightly diminished touch pinprick sensation left upper and lower extremity.  Normal on the right  ASSESSMENT/PLAN [name] is a 55 y.o. female with a medical history significant for chronic kidney disease, uncontrolled hypertension, anemia, tobacco and marijuana use who presented to the ED via EMS for evaluation of slurred speech. She states that this morning at 05:30 before going to bed she had a sudden onset of sharp neck pain that radiated upward towards her right eye with a sensation of right facial swelling and slurred speech. She states that the symptoms varied in intensity with the headache subsiding with use of acetaminophen by approximately 07:30 but with persistent slurred speech. She became concerned with the ongoing speech problems and EMS was activated. On arrival, EMS noted a blood pressure of 260 / 200 and transported Robin Navarro to the ED where her blood pressure was manually obtained with a reading of 290 / 160. With her persistent slurred speech, a Code Stroke was activated by the EDP for neurology evaluation, she additionally had some mild left-sided weakness.  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 Yes with Assist; Fluid consistency: Thin    No AC/AP prior to admission  Therapy recommendations:  Outpatient PT, SLP, outpatient OT  Disposition:  Likely home with outpatient therapies when medically ready    Hypertension  Home meds: coreg, hydralazine, norvasc, hygroton  Titrated hydralazine from 25 to '50mg'$  TID 4/25 added hydralazine prn. Dced coreg and started Lopressor '100mg'$  BID today. Discussed with Robin Navarro, [place]. Per chart review BP meds have not been filled appropriately. Cardiology consult, Dr. Lyla Son, is pending  Had recent admission 06/2020 and 07/2020 for uncontrolled HTN with no meds x one year. Cardiology referral sent but not kept.  . BP goal 160/100 . Asymptomatic bradycardia to 50s a few times since admission and now to 30s.  Marland Kitchen Avoid nephrotoxic agents  . TOC consulted for medication assistance and PCP options.  . Extensively counseled regarding need to control hypertension.   Hyperlipidemia  Home meds:  none  LDL 69, at goal < 70  CKD  Ctn 3.64->3.5->3.86  Her baseline is unclear, but has been 3.5-3.9 since last Sep/Oct  2021 on checks here  Avoid nephrotoxic agents   Other Stroke Risk Factors  Cigar smoker, advised to stop smoking  ETOH use, alcohol level <10, advised to drink no more than 1 drink(s) a day  Substance abuse - THC. Advised to abstain.   Obesity, Body mass index is 34.74 kg/m., BMI >/= 30 associated with increased stroke risk, recommend weight loss, diet and exercise as appropriate   Hospital day # 5 Recommend change Coreg to labetalol 100 mg twice daily for better blood pressure control.  Transfer to neurology floor bed available.  Cardiology consult for bradycardia.  Greater than 50% time during this 35-minute visit was spent on counseling and coordination of care discussion with care team and answering questions.  Antony Contras, MD   To contact Stroke Continuity provider, please refer to [email or web address] After hours, contact General Neurology

## 2021-01-28 ENCOUNTER — Other Ambulatory Visit (HOSPITAL_COMMUNITY): Payer: Self-pay

## 2021-02-09 ENCOUNTER — Ambulatory Visit: Payer: Self-pay | Admitting: Rehabilitative and Restorative Service Providers"

## 2021-02-09 ENCOUNTER — Ambulatory Visit: Payer: Self-pay | Admitting: Speech Pathology

## 2021-02-09 ENCOUNTER — Ambulatory Visit: Payer: Self-pay | Admitting: Occupational Therapy

## 2021-02-16 ENCOUNTER — Other Ambulatory Visit (HOSPITAL_COMMUNITY): Payer: Self-pay

## 2021-04-26 ENCOUNTER — Inpatient Hospital Stay: Payer: MEDICAID | Admitting: Neurology

## 2021-07-06 ENCOUNTER — Emergency Department (HOSPITAL_COMMUNITY): Payer: Medicaid Other

## 2021-07-06 ENCOUNTER — Inpatient Hospital Stay (HOSPITAL_COMMUNITY): Payer: Medicaid Other

## 2021-07-06 ENCOUNTER — Inpatient Hospital Stay (HOSPITAL_COMMUNITY)
Admission: EM | Admit: 2021-07-06 | Discharge: 2021-08-03 | DRG: 064 | Disposition: E | Payer: Medicaid Other | Attending: Neurology | Admitting: Neurology

## 2021-07-06 DIAGNOSIS — E870 Hyperosmolality and hypernatremia: Secondary | ICD-10-CM | POA: Diagnosis not present

## 2021-07-06 DIAGNOSIS — Z515 Encounter for palliative care: Secondary | ICD-10-CM | POA: Diagnosis not present

## 2021-07-06 DIAGNOSIS — Z9911 Dependence on respirator [ventilator] status: Secondary | ICD-10-CM | POA: Diagnosis not present

## 2021-07-06 DIAGNOSIS — J9600 Acute respiratory failure, unspecified whether with hypoxia or hypercapnia: Secondary | ICD-10-CM

## 2021-07-06 DIAGNOSIS — D631 Anemia in chronic kidney disease: Secondary | ICD-10-CM | POA: Diagnosis present

## 2021-07-06 DIAGNOSIS — I248 Other forms of acute ischemic heart disease: Secondary | ICD-10-CM | POA: Diagnosis present

## 2021-07-06 DIAGNOSIS — I161 Hypertensive emergency: Secondary | ICD-10-CM | POA: Diagnosis present

## 2021-07-06 DIAGNOSIS — T68XXXA Hypothermia, initial encounter: Secondary | ICD-10-CM | POA: Diagnosis not present

## 2021-07-06 DIAGNOSIS — I613 Nontraumatic intracerebral hemorrhage in brain stem: Principal | ICD-10-CM | POA: Diagnosis present

## 2021-07-06 DIAGNOSIS — G932 Benign intracranial hypertension: Secondary | ICD-10-CM | POA: Diagnosis present

## 2021-07-06 DIAGNOSIS — N179 Acute kidney failure, unspecified: Secondary | ICD-10-CM | POA: Diagnosis not present

## 2021-07-06 DIAGNOSIS — K920 Hematemesis: Secondary | ICD-10-CM | POA: Diagnosis not present

## 2021-07-06 DIAGNOSIS — E781 Pure hyperglyceridemia: Secondary | ICD-10-CM | POA: Diagnosis not present

## 2021-07-06 DIAGNOSIS — I615 Nontraumatic intracerebral hemorrhage, intraventricular: Secondary | ICD-10-CM | POA: Diagnosis present

## 2021-07-06 DIAGNOSIS — I469 Cardiac arrest, cause unspecified: Secondary | ICD-10-CM | POA: Diagnosis not present

## 2021-07-06 DIAGNOSIS — N185 Chronic kidney disease, stage 5: Secondary | ICD-10-CM | POA: Diagnosis present

## 2021-07-06 DIAGNOSIS — J9601 Acute respiratory failure with hypoxia: Secondary | ICD-10-CM | POA: Diagnosis present

## 2021-07-06 DIAGNOSIS — Z66 Do not resuscitate: Secondary | ICD-10-CM | POA: Diagnosis not present

## 2021-07-06 DIAGNOSIS — J690 Pneumonitis due to inhalation of food and vomit: Secondary | ICD-10-CM | POA: Diagnosis present

## 2021-07-06 DIAGNOSIS — Z79899 Other long term (current) drug therapy: Secondary | ICD-10-CM

## 2021-07-06 DIAGNOSIS — R1310 Dysphagia, unspecified: Secondary | ICD-10-CM | POA: Diagnosis present

## 2021-07-06 DIAGNOSIS — R4020 Unspecified coma: Secondary | ICD-10-CM | POA: Diagnosis present

## 2021-07-06 DIAGNOSIS — Z7189 Other specified counseling: Secondary | ICD-10-CM

## 2021-07-06 DIAGNOSIS — Z8249 Family history of ischemic heart disease and other diseases of the circulatory system: Secondary | ICD-10-CM

## 2021-07-06 DIAGNOSIS — G908 Other disorders of autonomic nervous system: Secondary | ICD-10-CM | POA: Diagnosis present

## 2021-07-06 DIAGNOSIS — Z833 Family history of diabetes mellitus: Secondary | ICD-10-CM

## 2021-07-06 DIAGNOSIS — Z6832 Body mass index (BMI) 32.0-32.9, adult: Secondary | ICD-10-CM

## 2021-07-06 DIAGNOSIS — I468 Cardiac arrest due to other underlying condition: Secondary | ICD-10-CM | POA: Diagnosis not present

## 2021-07-06 DIAGNOSIS — E785 Hyperlipidemia, unspecified: Secondary | ICD-10-CM | POA: Diagnosis present

## 2021-07-06 DIAGNOSIS — T41295A Adverse effect of other general anesthetics, initial encounter: Secondary | ICD-10-CM | POA: Diagnosis not present

## 2021-07-06 DIAGNOSIS — I120 Hypertensive chronic kidney disease with stage 5 chronic kidney disease or end stage renal disease: Secondary | ICD-10-CM | POA: Diagnosis present

## 2021-07-06 DIAGNOSIS — G8191 Hemiplegia, unspecified affecting right dominant side: Secondary | ICD-10-CM | POA: Diagnosis present

## 2021-07-06 DIAGNOSIS — R29735 NIHSS score 35: Secondary | ICD-10-CM | POA: Diagnosis present

## 2021-07-06 DIAGNOSIS — R292 Abnormal reflex: Secondary | ICD-10-CM

## 2021-07-06 DIAGNOSIS — U071 COVID-19: Secondary | ICD-10-CM | POA: Diagnosis present

## 2021-07-06 DIAGNOSIS — E876 Hypokalemia: Secondary | ICD-10-CM | POA: Diagnosis present

## 2021-07-06 DIAGNOSIS — E611 Iron deficiency: Secondary | ICD-10-CM | POA: Diagnosis not present

## 2021-07-06 DIAGNOSIS — E669 Obesity, unspecified: Secondary | ICD-10-CM | POA: Diagnosis present

## 2021-07-06 DIAGNOSIS — H5703 Miosis: Secondary | ICD-10-CM | POA: Diagnosis not present

## 2021-07-06 DIAGNOSIS — T503X5A Adverse effect of electrolytic, caloric and water-balance agents, initial encounter: Secondary | ICD-10-CM | POA: Diagnosis not present

## 2021-07-06 DIAGNOSIS — G9349 Other encephalopathy: Secondary | ICD-10-CM | POA: Diagnosis present

## 2021-07-06 DIAGNOSIS — R0609 Other forms of dyspnea: Secondary | ICD-10-CM | POA: Diagnosis not present

## 2021-07-06 DIAGNOSIS — G918 Other hydrocephalus: Secondary | ICD-10-CM | POA: Diagnosis present

## 2021-07-06 DIAGNOSIS — I619 Nontraumatic intracerebral hemorrhage, unspecified: Secondary | ICD-10-CM | POA: Diagnosis present

## 2021-07-06 DIAGNOSIS — F17290 Nicotine dependence, other tobacco product, uncomplicated: Secondary | ICD-10-CM | POA: Diagnosis present

## 2021-07-06 DIAGNOSIS — G911 Obstructive hydrocephalus: Secondary | ICD-10-CM | POA: Diagnosis not present

## 2021-07-06 DIAGNOSIS — Z888 Allergy status to other drugs, medicaments and biological substances status: Secondary | ICD-10-CM

## 2021-07-06 DIAGNOSIS — D696 Thrombocytopenia, unspecified: Secondary | ICD-10-CM | POA: Diagnosis not present

## 2021-07-06 DIAGNOSIS — G936 Cerebral edema: Secondary | ICD-10-CM | POA: Diagnosis present

## 2021-07-06 DIAGNOSIS — T461X5A Adverse effect of calcium-channel blockers, initial encounter: Secondary | ICD-10-CM | POA: Diagnosis not present

## 2021-07-06 DIAGNOSIS — R4781 Slurred speech: Secondary | ICD-10-CM | POA: Diagnosis present

## 2021-07-06 DIAGNOSIS — Z8673 Personal history of transient ischemic attack (TIA), and cerebral infarction without residual deficits: Secondary | ICD-10-CM

## 2021-07-06 DIAGNOSIS — R001 Bradycardia, unspecified: Secondary | ICD-10-CM | POA: Diagnosis not present

## 2021-07-06 LAB — APTT: aPTT: 31 seconds (ref 24–36)

## 2021-07-06 LAB — RAPID URINE DRUG SCREEN, HOSP PERFORMED
Amphetamines: NOT DETECTED
Barbiturates: NOT DETECTED
Benzodiazepines: NOT DETECTED
Cocaine: NOT DETECTED
Opiates: NOT DETECTED
Tetrahydrocannabinol: NOT DETECTED

## 2021-07-06 LAB — COMPREHENSIVE METABOLIC PANEL
ALT: 82 U/L — ABNORMAL HIGH (ref 0–44)
AST: 124 U/L — ABNORMAL HIGH (ref 15–41)
Albumin: 3.7 g/dL (ref 3.5–5.0)
Alkaline Phosphatase: 95 U/L (ref 38–126)
Anion gap: 11 (ref 5–15)
BUN: 62 mg/dL — ABNORMAL HIGH (ref 6–20)
CO2: 24 mmol/L (ref 22–32)
Calcium: 9.3 mg/dL (ref 8.9–10.3)
Chloride: 103 mmol/L (ref 98–111)
Creatinine, Ser: 4.9 mg/dL — ABNORMAL HIGH (ref 0.44–1.00)
GFR, Estimated: 10 mL/min — ABNORMAL LOW (ref >60)
Glucose, Bld: 159 mg/dL — ABNORMAL HIGH (ref 70–99)
Potassium: 3.6 mmol/L (ref 3.5–5.1)
Sodium: 138 mmol/L (ref 135–145)
Total Bilirubin: 0.5 mg/dL (ref 0.3–1.2)
Total Protein: 7.5 g/dL (ref 6.5–8.1)

## 2021-07-06 LAB — URINALYSIS, ROUTINE W REFLEX MICROSCOPIC
Bilirubin Urine: NEGATIVE
Glucose, UA: 50 mg/dL — AB
Ketones, ur: NEGATIVE mg/dL
Leukocytes,Ua: NEGATIVE
Nitrite: NEGATIVE
Protein, ur: 100 mg/dL — AB
Specific Gravity, Urine: 1.01 (ref 1.005–1.030)
pH: 7 (ref 5.0–8.0)

## 2021-07-06 LAB — CBC WITH DIFFERENTIAL/PLATELET
Abs Immature Granulocytes: 0.17 10*3/uL — ABNORMAL HIGH (ref 0.00–0.07)
Basophils Absolute: 0 10*3/uL (ref 0.0–0.1)
Basophils Relative: 0 %
Eosinophils Absolute: 0.2 10*3/uL (ref 0.0–0.5)
Eosinophils Relative: 1 %
HCT: 30.6 % — ABNORMAL LOW (ref 36.0–46.0)
Hemoglobin: 9.8 g/dL — ABNORMAL LOW (ref 12.0–15.0)
Immature Granulocytes: 2 %
Lymphocytes Relative: 32 %
Lymphs Abs: 3.8 10*3/uL (ref 0.7–4.0)
MCH: 28.2 pg (ref 26.0–34.0)
MCHC: 32 g/dL (ref 30.0–36.0)
MCV: 88.2 fL (ref 80.0–100.0)
Monocytes Absolute: 0.7 10*3/uL (ref 0.1–1.0)
Monocytes Relative: 6 %
Neutro Abs: 6.8 10*3/uL (ref 1.7–7.7)
Neutrophils Relative %: 59 %
Platelets: 198 10*3/uL (ref 150–400)
RBC: 3.47 MIL/uL — ABNORMAL LOW (ref 3.87–5.11)
RDW: 17.1 % — ABNORMAL HIGH (ref 11.5–15.5)
WBC: 11.6 10*3/uL — ABNORMAL HIGH (ref 4.0–10.5)
nRBC: 0 % (ref 0.0–0.2)

## 2021-07-06 LAB — MRSA NEXT GEN BY PCR, NASAL: MRSA by PCR Next Gen: NOT DETECTED

## 2021-07-06 LAB — HEMOGLOBIN A1C
Hgb A1c MFr Bld: 5.1 % (ref 4.8–5.6)
Mean Plasma Glucose: 99.67 mg/dL

## 2021-07-06 LAB — I-STAT ARTERIAL BLOOD GAS, ED
Acid-Base Excess: 7 mmol/L — ABNORMAL HIGH (ref 0.0–2.0)
Bicarbonate: 31.1 mmol/L — ABNORMAL HIGH (ref 20.0–28.0)
Calcium, Ion: 1.17 mmol/L (ref 1.15–1.40)
HCT: 29 % — ABNORMAL LOW (ref 36.0–46.0)
Hemoglobin: 9.9 g/dL — ABNORMAL LOW (ref 12.0–15.0)
O2 Saturation: 100 %
Patient temperature: 98.8
Potassium: 3.2 mmol/L — ABNORMAL LOW (ref 3.5–5.1)
Sodium: 142 mmol/L (ref 135–145)
TCO2: 32 mmol/L (ref 22–32)
pCO2 arterial: 42.2 mmHg (ref 32.0–48.0)
pH, Arterial: 7.476 — ABNORMAL HIGH (ref 7.350–7.450)
pO2, Arterial: 295 mmHg — ABNORMAL HIGH (ref 83.0–108.0)

## 2021-07-06 LAB — I-STAT CHEM 8, ED
BUN: 68 mg/dL — ABNORMAL HIGH (ref 6–20)
Calcium, Ion: 1.12 mmol/L — ABNORMAL LOW (ref 1.15–1.40)
Chloride: 105 mmol/L (ref 98–111)
Creatinine, Ser: 5 mg/dL — ABNORMAL HIGH (ref 0.44–1.00)
Glucose, Bld: 159 mg/dL — ABNORMAL HIGH (ref 70–99)
HCT: 32 % — ABNORMAL LOW (ref 36.0–46.0)
Hemoglobin: 10.9 g/dL — ABNORMAL LOW (ref 12.0–15.0)
Potassium: 3.6 mmol/L (ref 3.5–5.1)
Sodium: 140 mmol/L (ref 135–145)
TCO2: 25 mmol/L (ref 22–32)

## 2021-07-06 LAB — GLUCOSE, CAPILLARY
Glucose-Capillary: 106 mg/dL — ABNORMAL HIGH (ref 70–99)
Glucose-Capillary: 129 mg/dL — ABNORMAL HIGH (ref 70–99)
Glucose-Capillary: 153 mg/dL — ABNORMAL HIGH (ref 70–99)
Glucose-Capillary: 153 mg/dL — ABNORMAL HIGH (ref 70–99)

## 2021-07-06 LAB — TROPONIN I (HIGH SENSITIVITY)
Troponin I (High Sensitivity): 42 ng/L — ABNORMAL HIGH (ref <18)
Troponin I (High Sensitivity): 257 ng/L (ref <18)

## 2021-07-06 LAB — SODIUM
Sodium: 143 mmol/L (ref 135–145)
Sodium: 148 mmol/L — ABNORMAL HIGH (ref 135–145)

## 2021-07-06 LAB — CBG MONITORING, ED: Glucose-Capillary: 149 mg/dL — ABNORMAL HIGH (ref 70–99)

## 2021-07-06 LAB — RESP PANEL BY RT-PCR (FLU A&B, COVID) ARPGX2
Influenza A by PCR: NEGATIVE
Influenza B by PCR: NEGATIVE
SARS Coronavirus 2 by RT PCR: POSITIVE — AB

## 2021-07-06 LAB — PROTIME-INR
INR: 1 (ref 0.8–1.2)
Prothrombin Time: 13.3 seconds (ref 11.4–15.2)

## 2021-07-06 IMAGING — DX DG CHEST 1V PORT
1 series · 1 of 1 positions shown · non-contrast
Comparison: Chest radiograph obtained earlier the same day

CLINICAL DATA: Line placement, ETT, COVID positive

EXAM:
PORTABLE CHEST 1 VIEW

[chest ap]
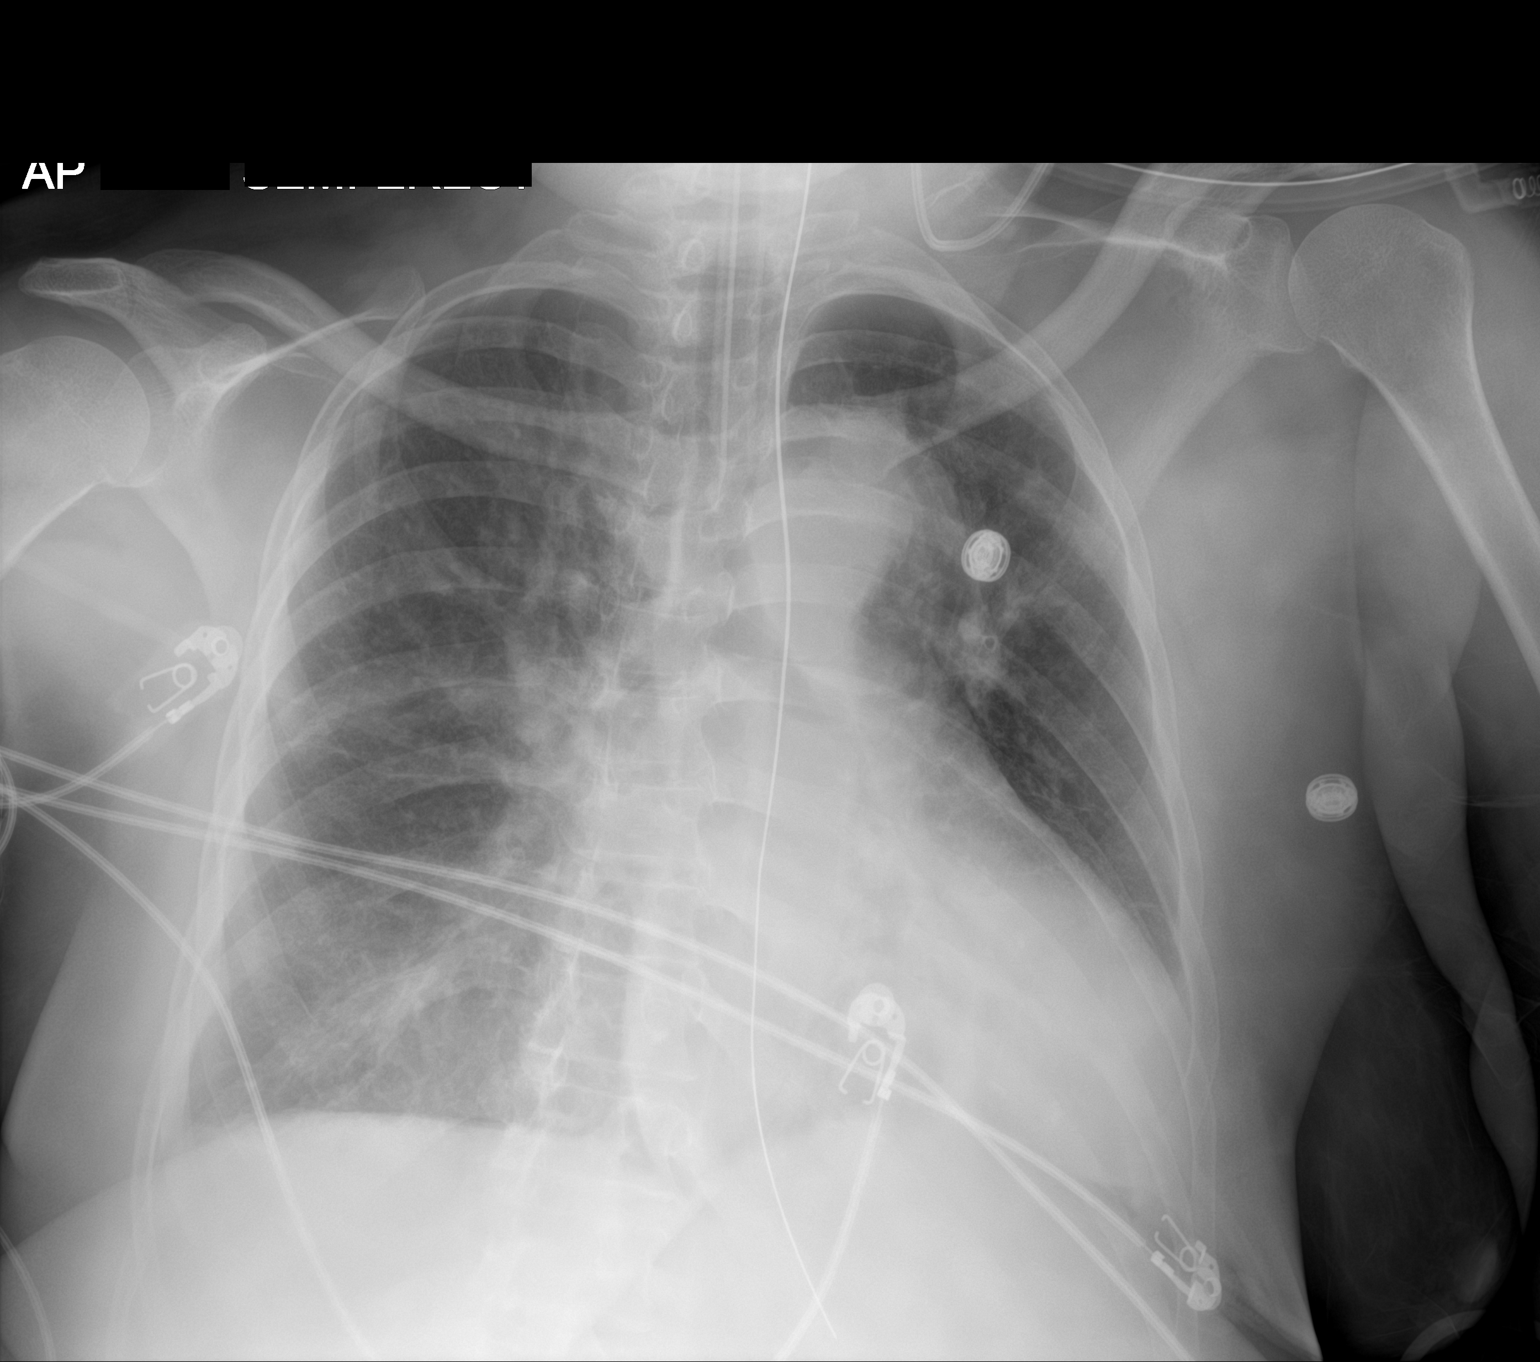

[1 of 1 positions shown; findings below may reference images not displayed]

FINDINGS: The endotracheal tube is in the midthoracic trachea approximately
3.3 cm from the carina.

The cardiac silhouette is stable. The upper mediastinum appears
widened, similar to the most recent prior study but increased
compared to a more remote study from [LH].

There is increased vascular congestion. There is no new focal
consolidation. There is no pleural effusion. There is no
pneumothorax.

The bones are stable.
IMPRESSION: 1. Unchanged abnormal upper mediastinal contours. CT chest is
recommended for better evaluation.
2. Increased vascular congestion without overt pulmonary edema.

## 2021-07-06 IMAGING — DX DG CHEST 1V PORT
1 series · 1 of 1 positions shown · non-contrast
Comparison: [DATE] [DATE], [DATE] ([DATE] a.m.)

CLINICAL DATA: Acute respiratory failure, COVID positive.

EXAM:
PORTABLE CHEST 1 VIEW

[chest ap]
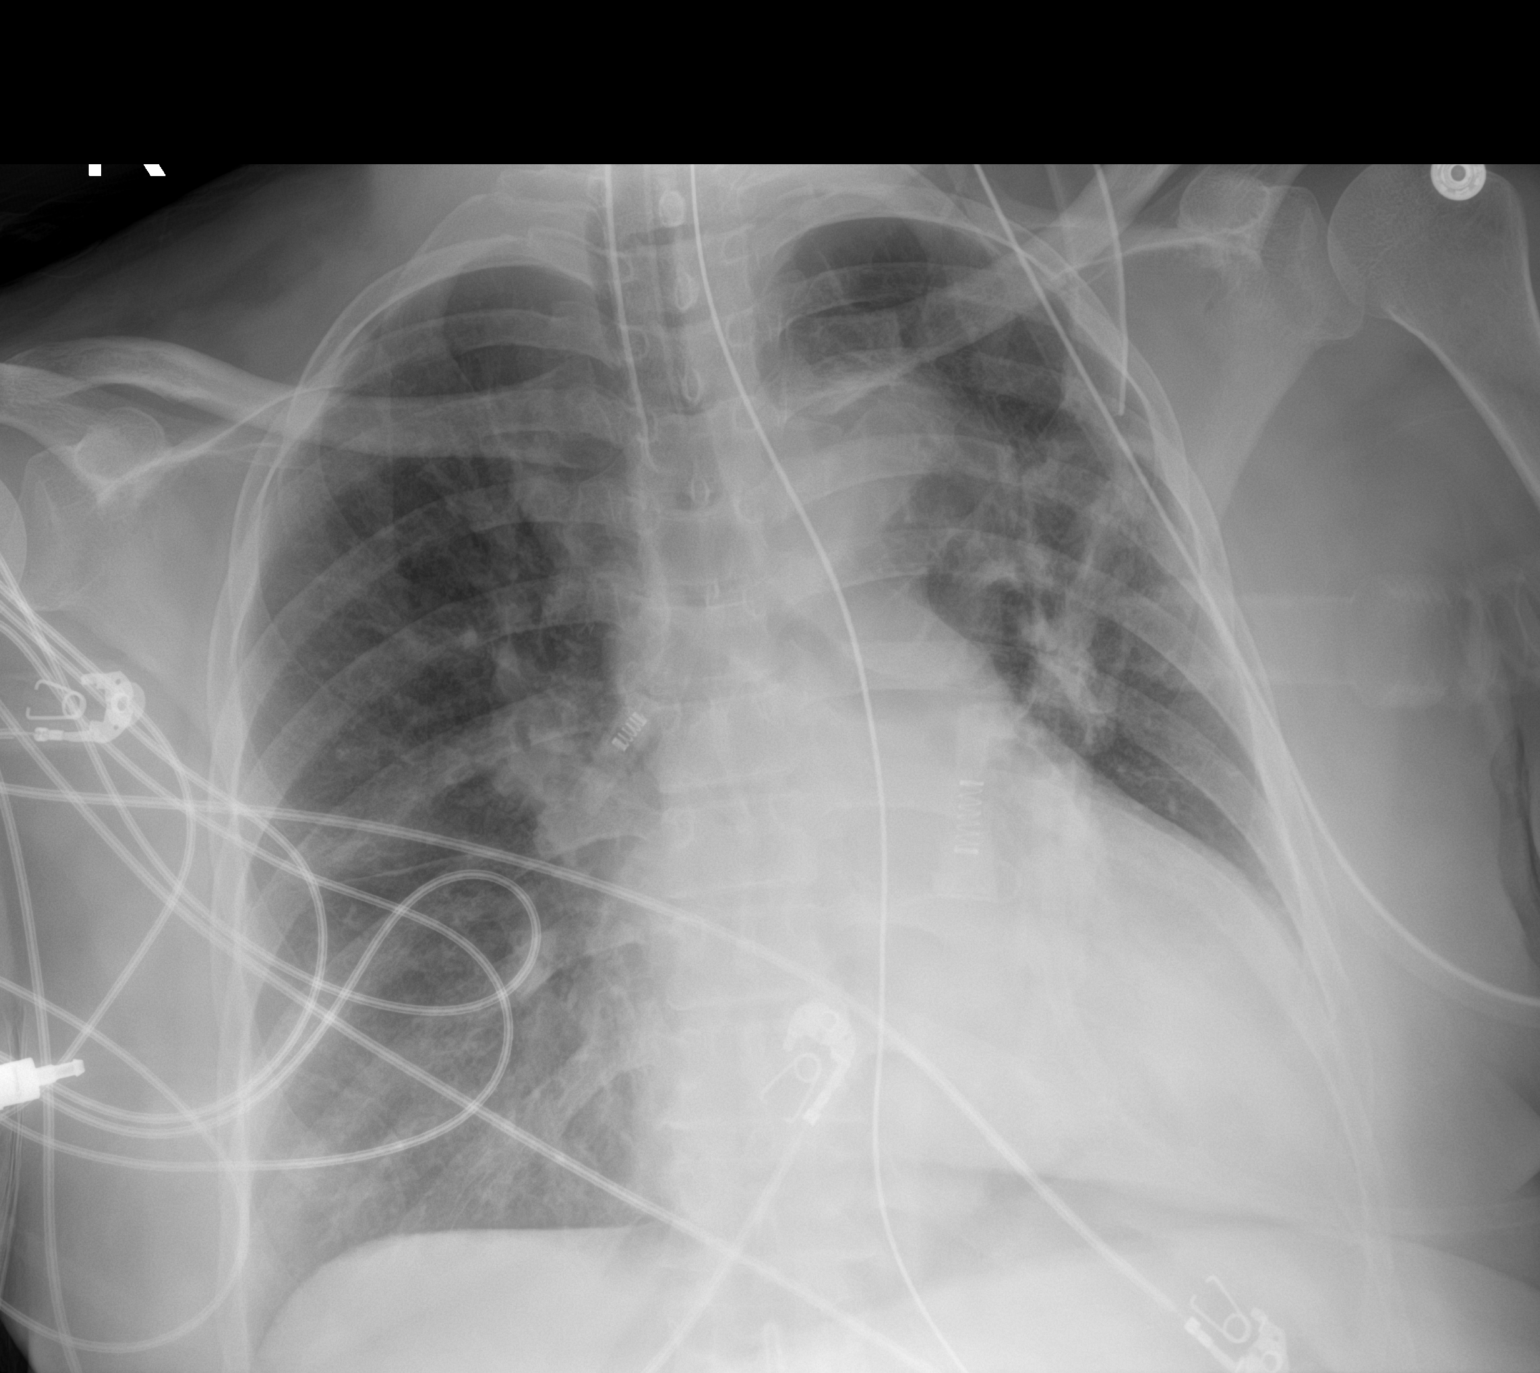

[1 of 1 positions shown; findings below may reference images not displayed]

FINDINGS: There is stable endotracheal tube and nasogastric tube positioning.
Mild left suprahilar atelectasis and/or infiltrate is noted. This is
mildly increased in severity when compared to the prior study. There
is no evidence of a pleural effusion or pneumothorax. The cardiac
silhouette is mildly enlarged. Stable prominence of the superior
mediastinum is noted with irregular borders along the expected
region of the aortic arch. The visualized skeletal structures are
unremarkable.
IMPRESSION: 1. Mild left suprahilar atelectasis and/or infiltrate, increased in
severity when compared to the prior study.
2. Persistent abnormal upper mediastinal contours. Further
evaluation with chest CT is recommended.

## 2021-07-06 IMAGING — CT CT HEAD W/O CM
4 of 5 series · 15 of 47 positions shown, 17 images · non-contrast
Comparison: Head CT [DATE]. brain MRI [DATE].

CLINICAL DATA: 55 year old female status cardiac arrest and CPR.

EXAM:
CT HEAD WITHOUT CONTRAST
TECHNIQUE: Contiguous axial images were obtained from the base of the skull
through the vertex without intravenous contrast.

[Series 2: head wo · axial · 0.49mm/px · z∈[-178,-78]mm · 5 of 31 slices shown, 7 images (1 of 2)]
[im 6/31  brain]
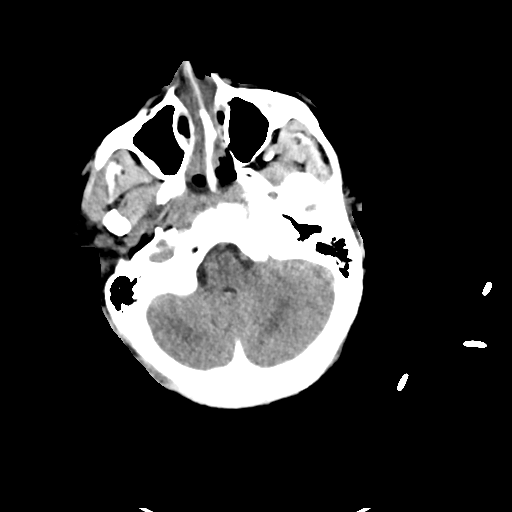
[im 6/31  bone]
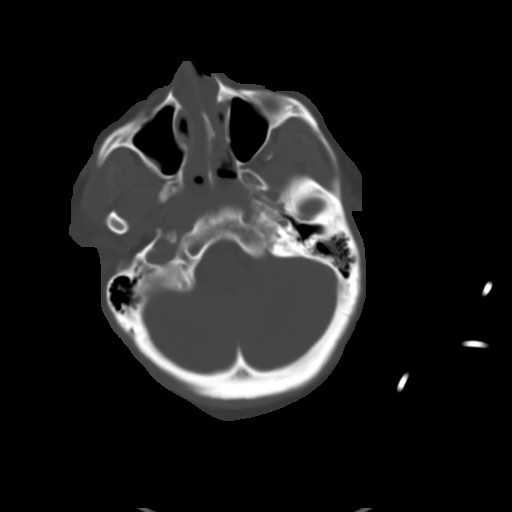
[im 11/31  brain]
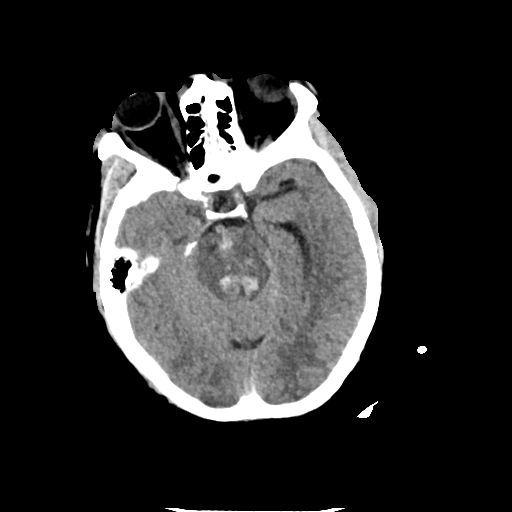
[im 16/31  brain]
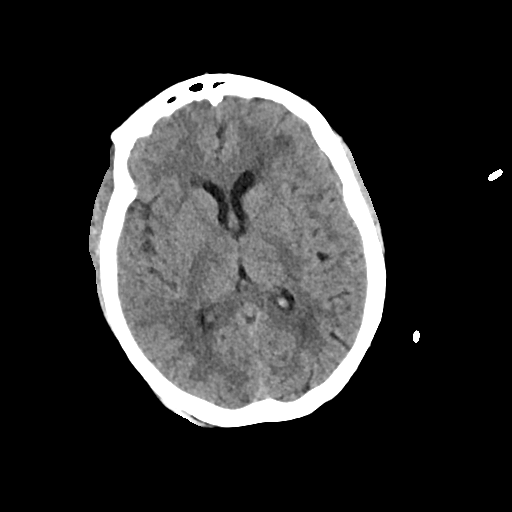
[im 21/31  brain]
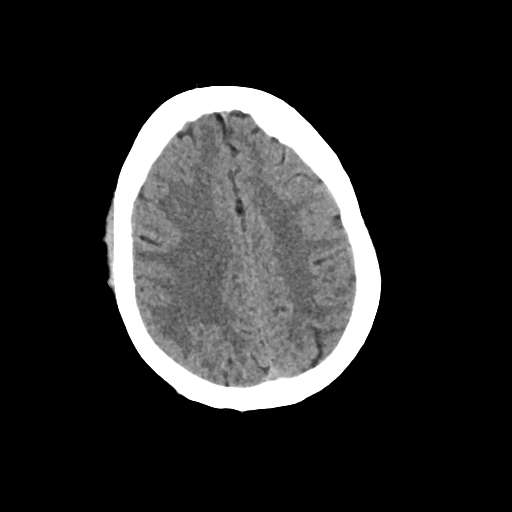
[im 26/31  brain]
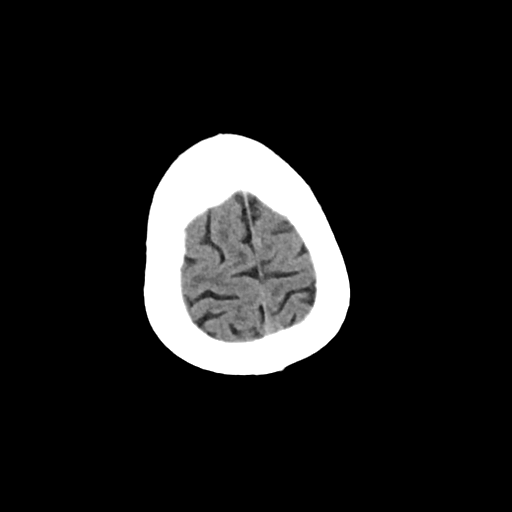
[im 26/31  bone]
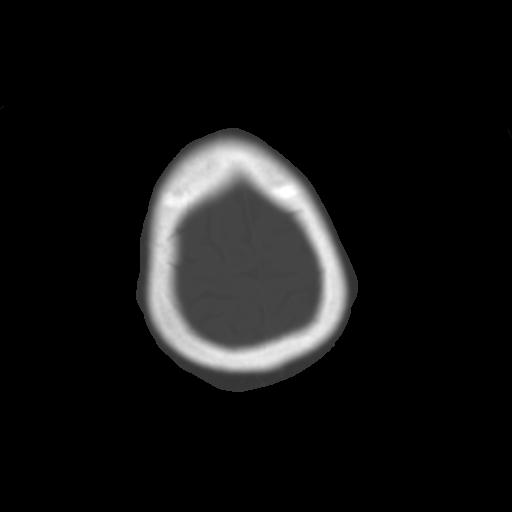

[Series 4: cor soft · coronal · 0.36mm/px · 3 of 68 slices shown]
[im 23/68  brain]
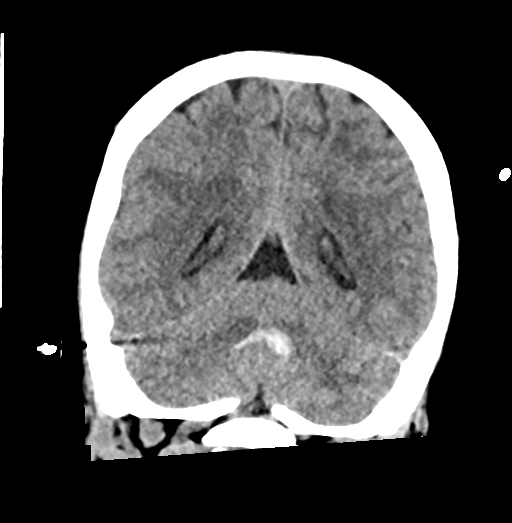
[im 30/68  brain]
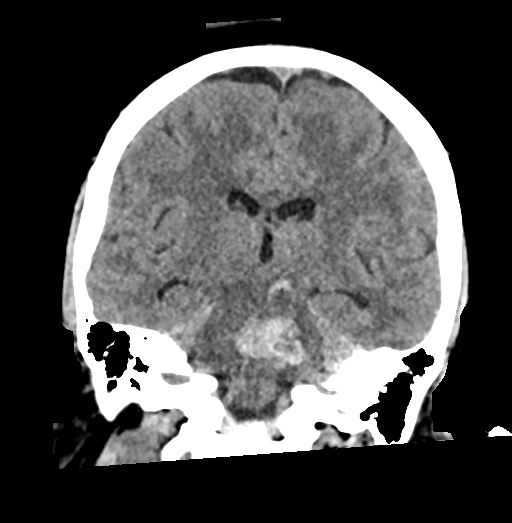
[im 38/68  brain]
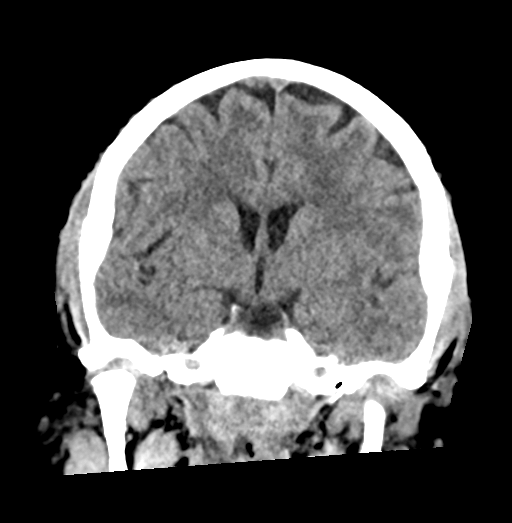

[Series 5: sag soft · sagittal · 0.36mm/px · 3 of 61 slices shown]
[im 21/61  brain]
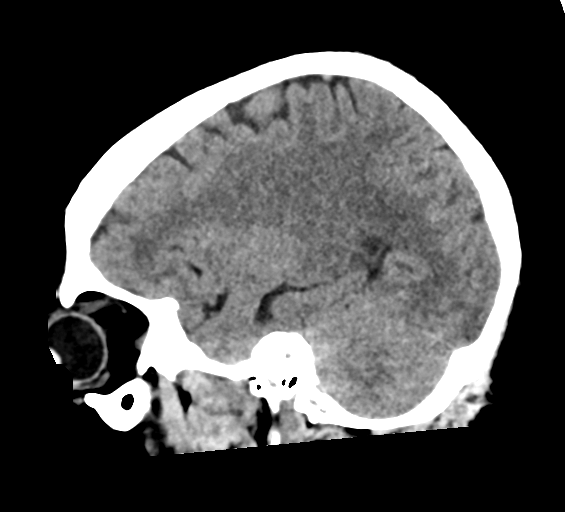
[im 31/61  brain]
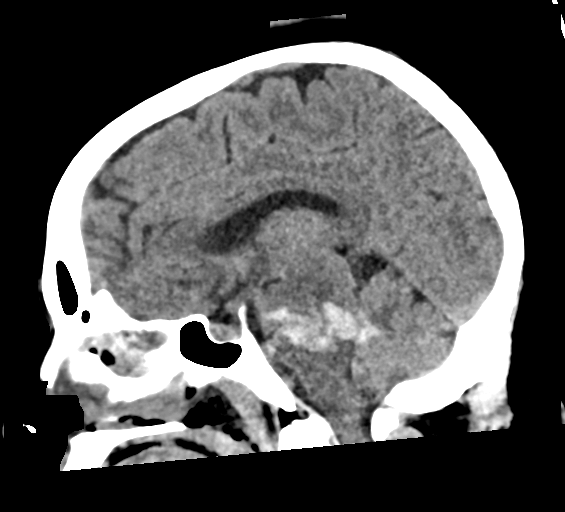
[im 41/61  brain]
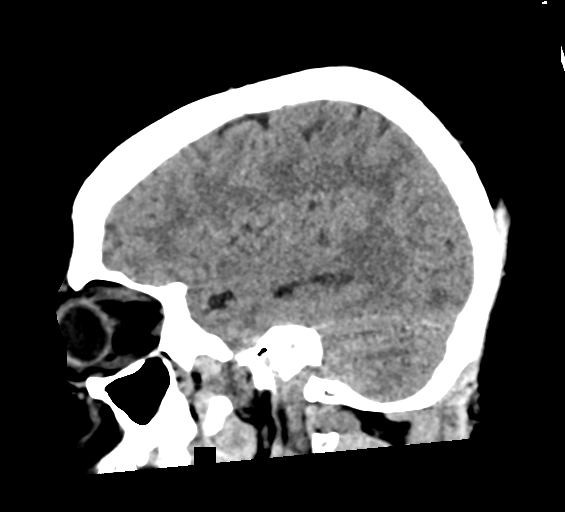

[Series 6: head wo · axial · 0.36mm/px · z∈[-180,-96]mm · 4 of 35 slices shown (2 of 2)]
[im 6/35  brain]
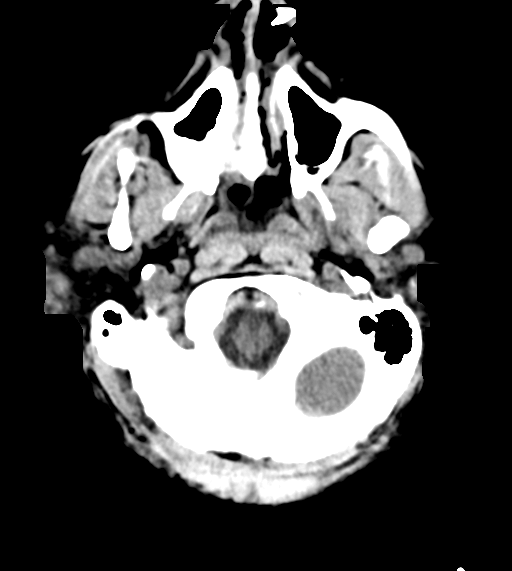
[im 12/35  brain]
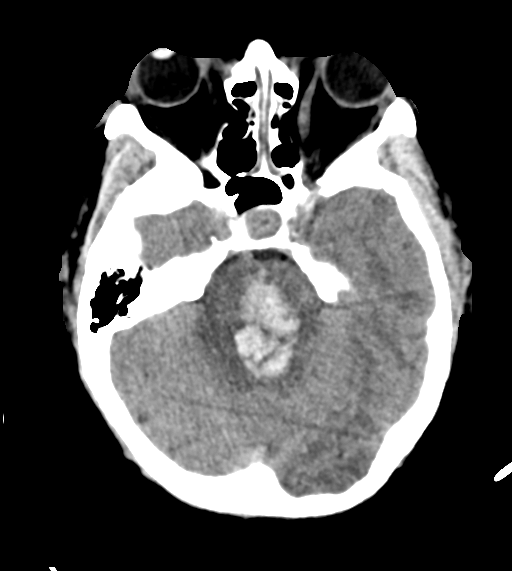
[im 18/35  brain]
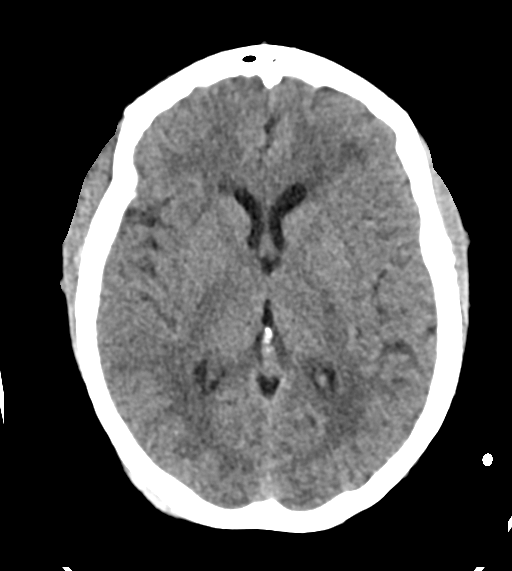
[im 23/35  brain]
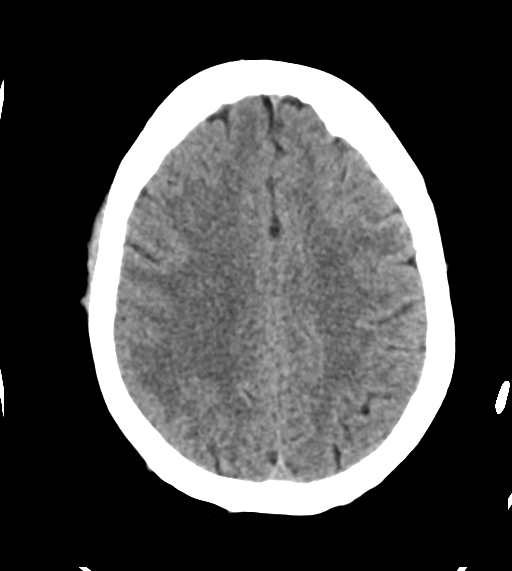

[15 of 47 positions shown; findings below may reference images not displayed]

FINDINGS: Brain: Relatively large and lobulated acute brainstem hemorrhage.
Hyperdense blood products encompass 39 x 26 x 32 mm (AP by
transverse by CC) - 16 mL centered at the mid pons and tracking both
cephalad into the left midbrain and laterally toward the left
cerebellar peduncle.

This is distinct from a much smaller right ventral pontine
hemorrhage seen in [place].

There is generalized pontine edema. Mildly effaced basilar cisterns.
Cisterna magna remains patent. No convincing intraventricular or
extra-axial extension of blood at this time.

No ventriculomegaly. No superimposed acute cortically based infarct
identified. There is scattered supratentorial white matter
hypodensity which is chronic and stable.

Vascular: Mild Calcified atherosclerosis at the skull base. No
suspicious intracranial vascular hyperdensity.

Skull: No acute osseous abnormality identified. Chronic bilateral
lamina papyracea fractures.

Sinuses/Orbits: Intubated on the scout view. Fluid in the visible
pharynx. Chronic lamina papyracea fractures. Mild paranasal sinus
fluid and/or mucosal thickening now. Tympanic cavities and mastoids
remain clear.

Other: No acute orbit or scalp soft tissue finding. Chronic right
posterior convexity scalp lipoma.
IMPRESSION: 1. Primary Acute Brainstem Hemorrhage, with a relatively large 16 mL
volume of hyperdense blood within the pons, tracking cephalad into
the left midbrain and laterally towards the left cerebellar
peduncle. Pontine edema.

2. No intraventricular or extra-axial extension. No
ventriculomegaly. And basilar cisterns remain patent at this time.

3. Sequelae of small vessel disease is strongly favored, and this
patient presented with a smaller right ventral pontine hemorrhage in
[place] this year (see Brain MRI [DATE]).

Critical Value/emergent results were called by telephone at the time
of interpretation on [DATE] at [DATE] to Dr. SHANITA , who
verbally acknowledged these results.

## 2021-07-06 IMAGING — CT CT HEAD W/O CM
3 of 4 series · 16 of 47 positions shown, 19 images · non-contrast
Comparison: Head CT earlier same day.

CLINICAL DATA: Stroke.  Follow-up.

EXAM:
CT HEAD WITHOUT CONTRAST
TECHNIQUE: Contiguous axial images were obtained from the base of the skull
through the vertex without intravenous contrast.

[Series 6: head 5.0 h30s · axial · 0.39mm/px · z∈[+1063,+1193]mm · 10 of 32 slices shown, 13 images]
[im 3/32  brain]
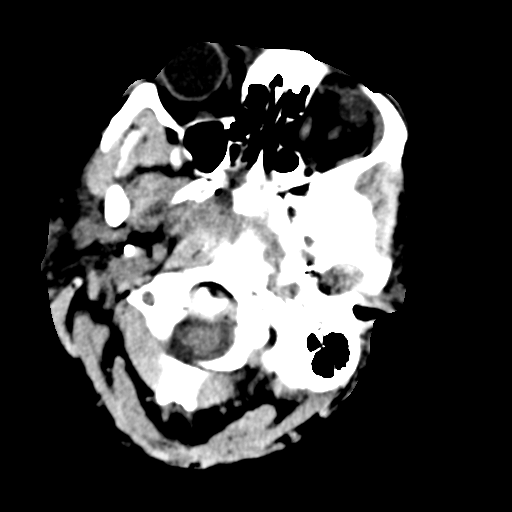
[im 3/32  bone]
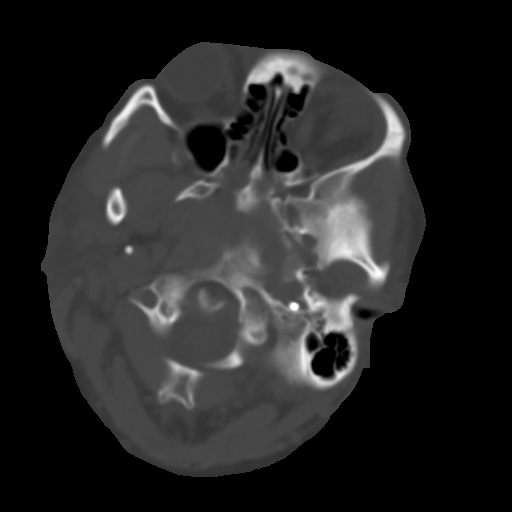
[im 6/32  brain]
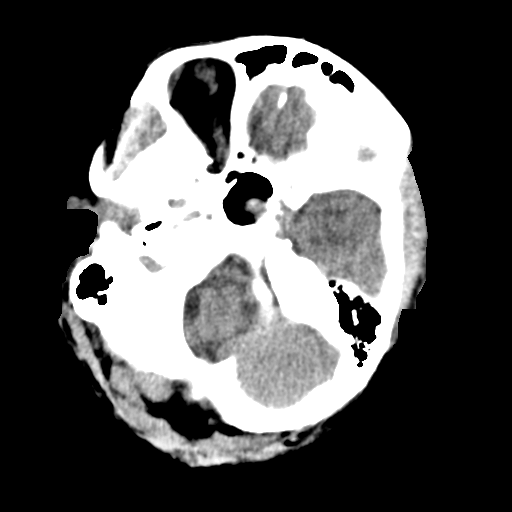
[im 9/32  brain]
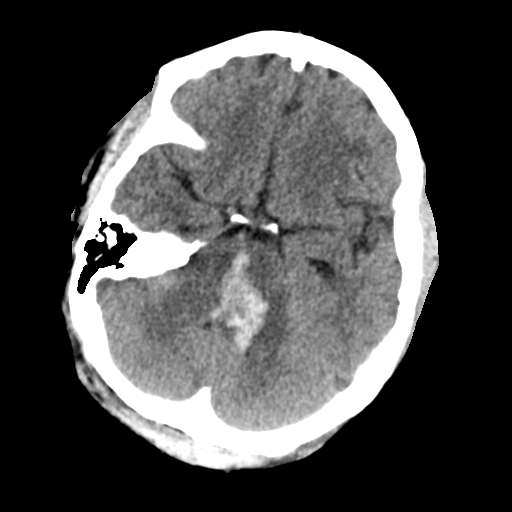
[im 12/32  brain]
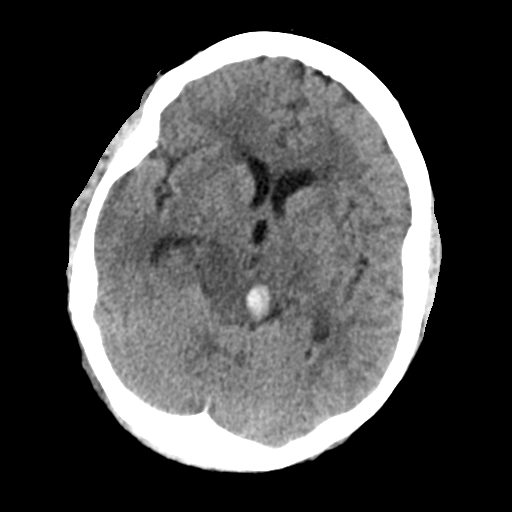
[im 15/32  brain]
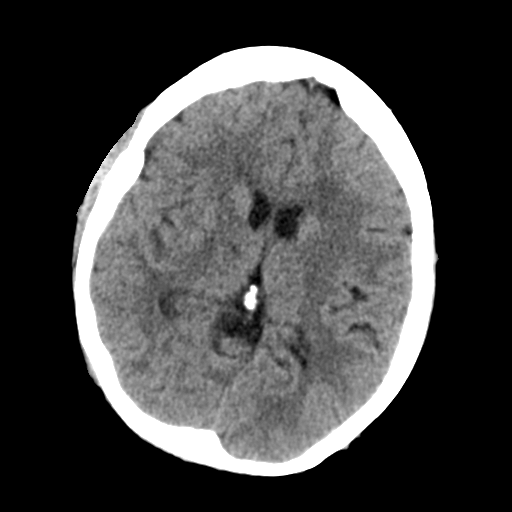
[im 15/32  bone]
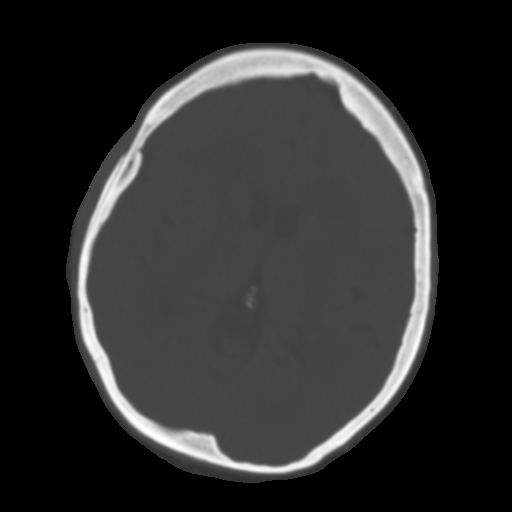
[im 17/32  brain]
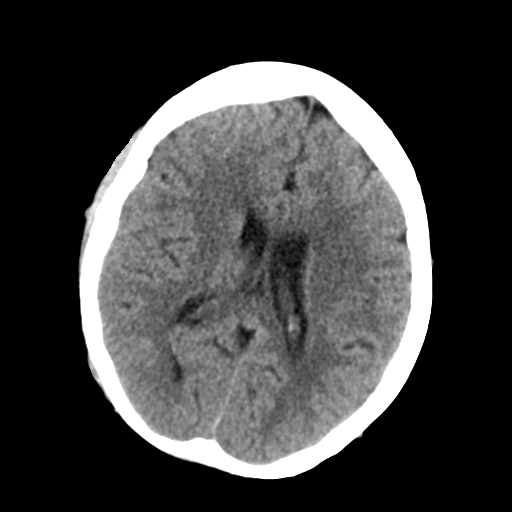
[im 20/32  brain]
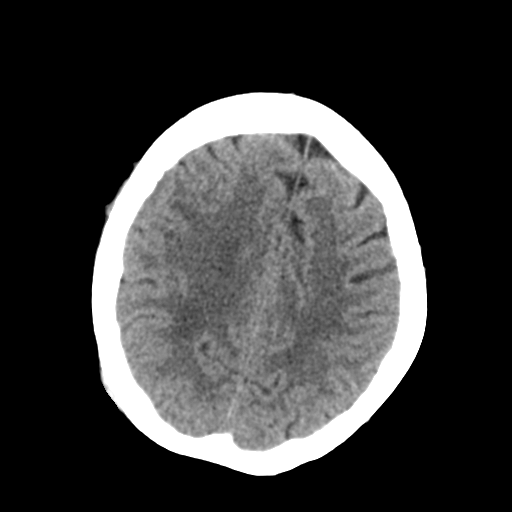
[im 23/32  brain]
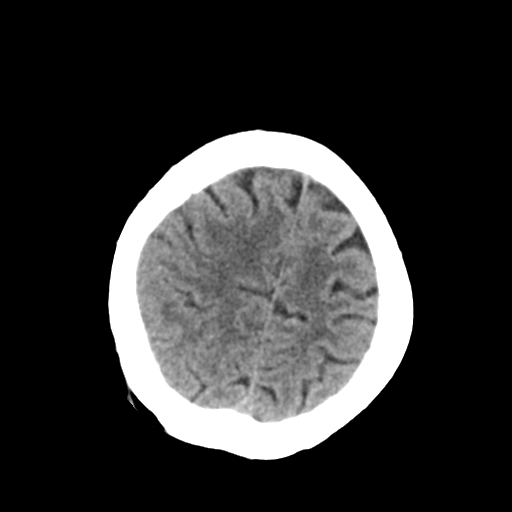
[im 26/32  brain]
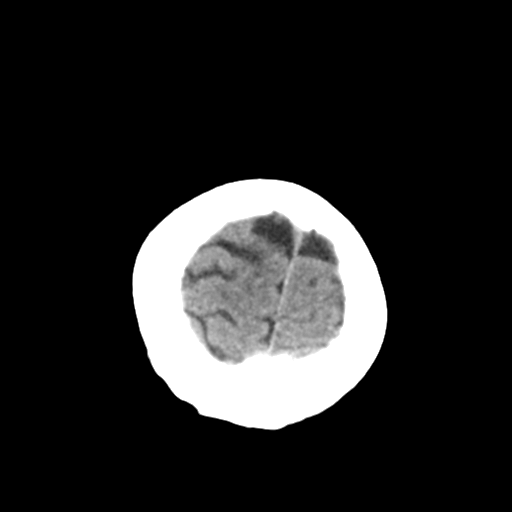
[im 26/32  bone]
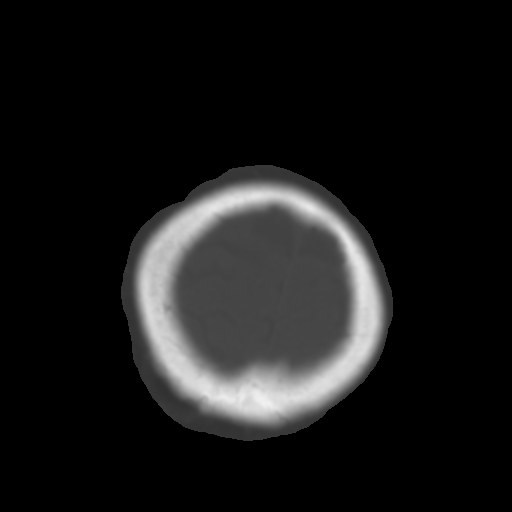
[im 29/32  brain]
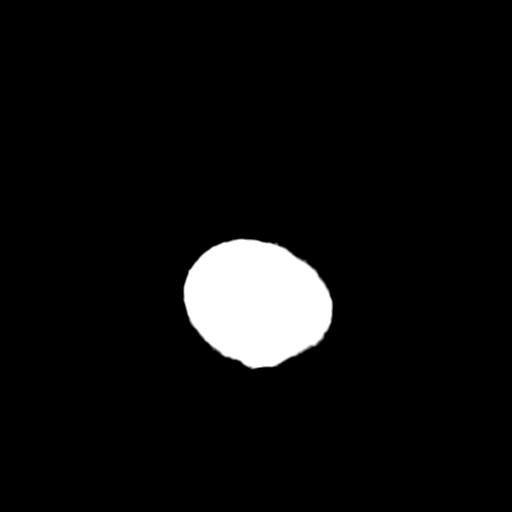

[Series 8: head 3.0 mpr cor · coronal · 0.35mm/px · 3 of 60 slices shown]
[im 20/60  brain]
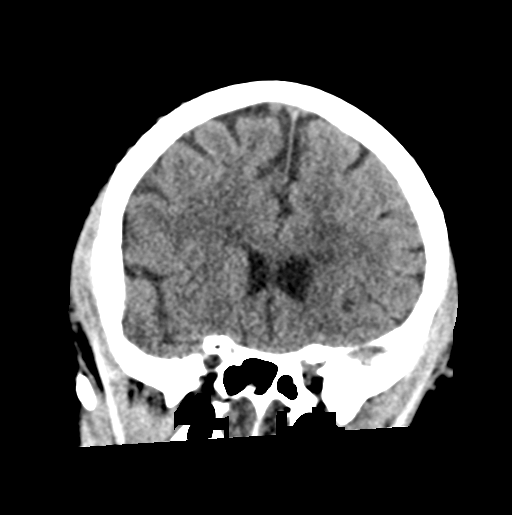
[im 27/60  brain]
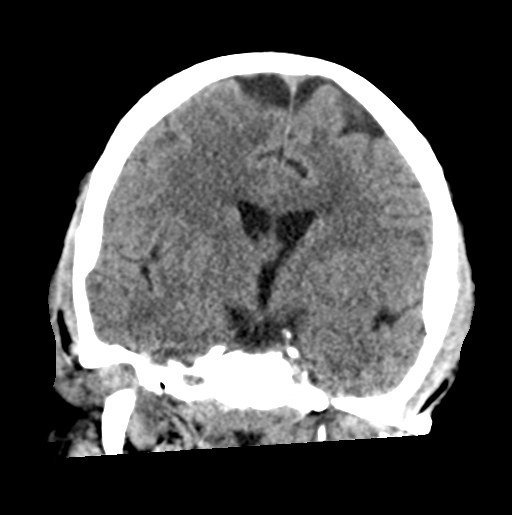
[im 33/60  brain]
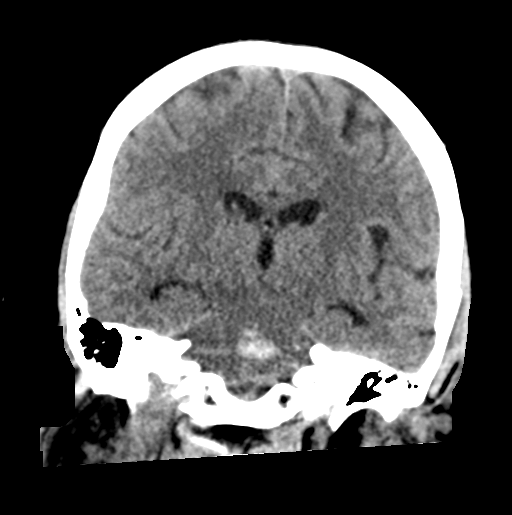

[Series 10: head 3.0 mpr sag · sagittal · 0.35mm/px · 3 of 49 slices shown]
[im 17/49  brain]
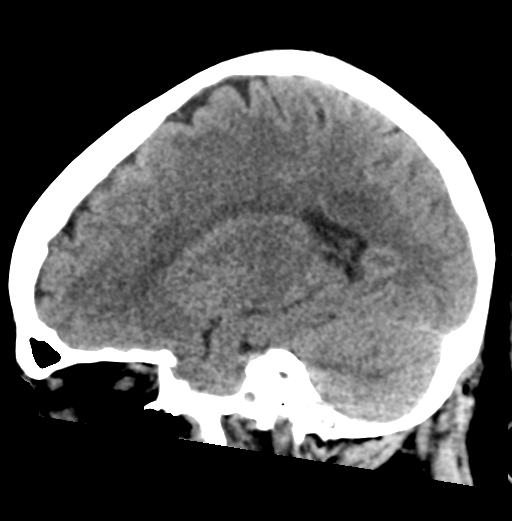
[im 25/49  brain]
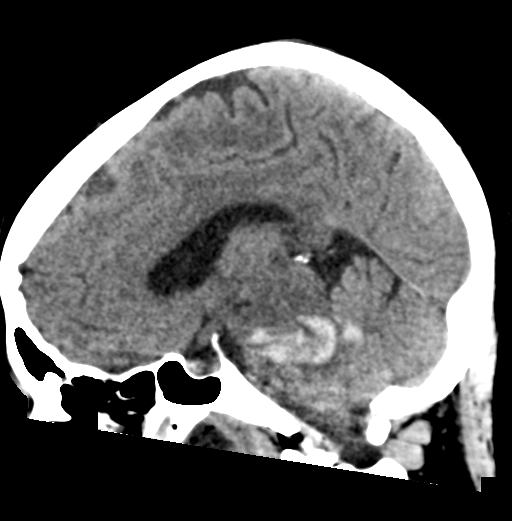
[im 33/49  brain]
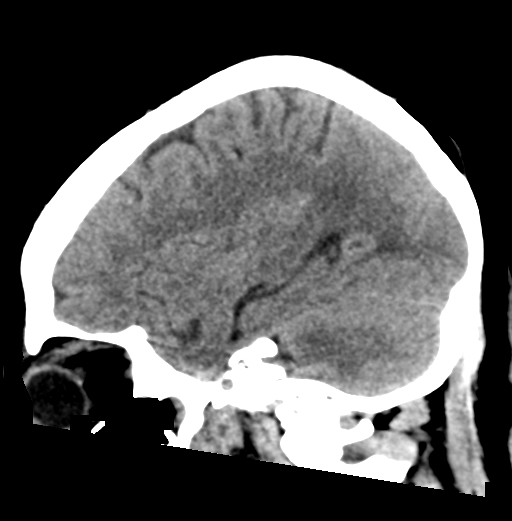

[16 of 47 positions shown; findings below may reference images not displayed]

FINDINGS: Brain: Intraparenchymal hemorrhage within the pons and left midbrain
appears similar. The density is becoming less distinct. Approximate
size of the hematoma remains 3.5 cm. Edema is seen throughout the
pons and midbrain. Small amount of fourth ventricular blood as seen
previously. No evidence of additional bleeding since the prior exam.
Question if there could be slight increase in fullness of the
temporal horns of the lateral ventricles that could be early
evidence of hydrocephalus. Otherwise, there are mild chronic
small-vessel ischemic changes of the cerebral hemispheric white
matter.

Vascular: No new vascular finding.

Skull: Negative

Sinuses/Orbits: Clear/normal

Other: None
IMPRESSION: No evidence of progressive hemorrhage. Intraparenchymal hemorrhage
in the pons and left midbrain with surrounding edema. Small amount
of intraventricular blood in the fourth ventricle. Question slight
increase in size of the temporal horns of the lateral ventricles.

## 2021-07-06 IMAGING — DX DG CHEST 1V PORT
1 series · 1 of 1 positions shown · non-contrast
Comparison: Chest radiographs [DATE] and earlier.

CLINICAL DATA: 55-year-old female with brainstem hemorrhage, status
post CPR.

EXAM:
PORTABLE CHEST 1 VIEW

[chest]
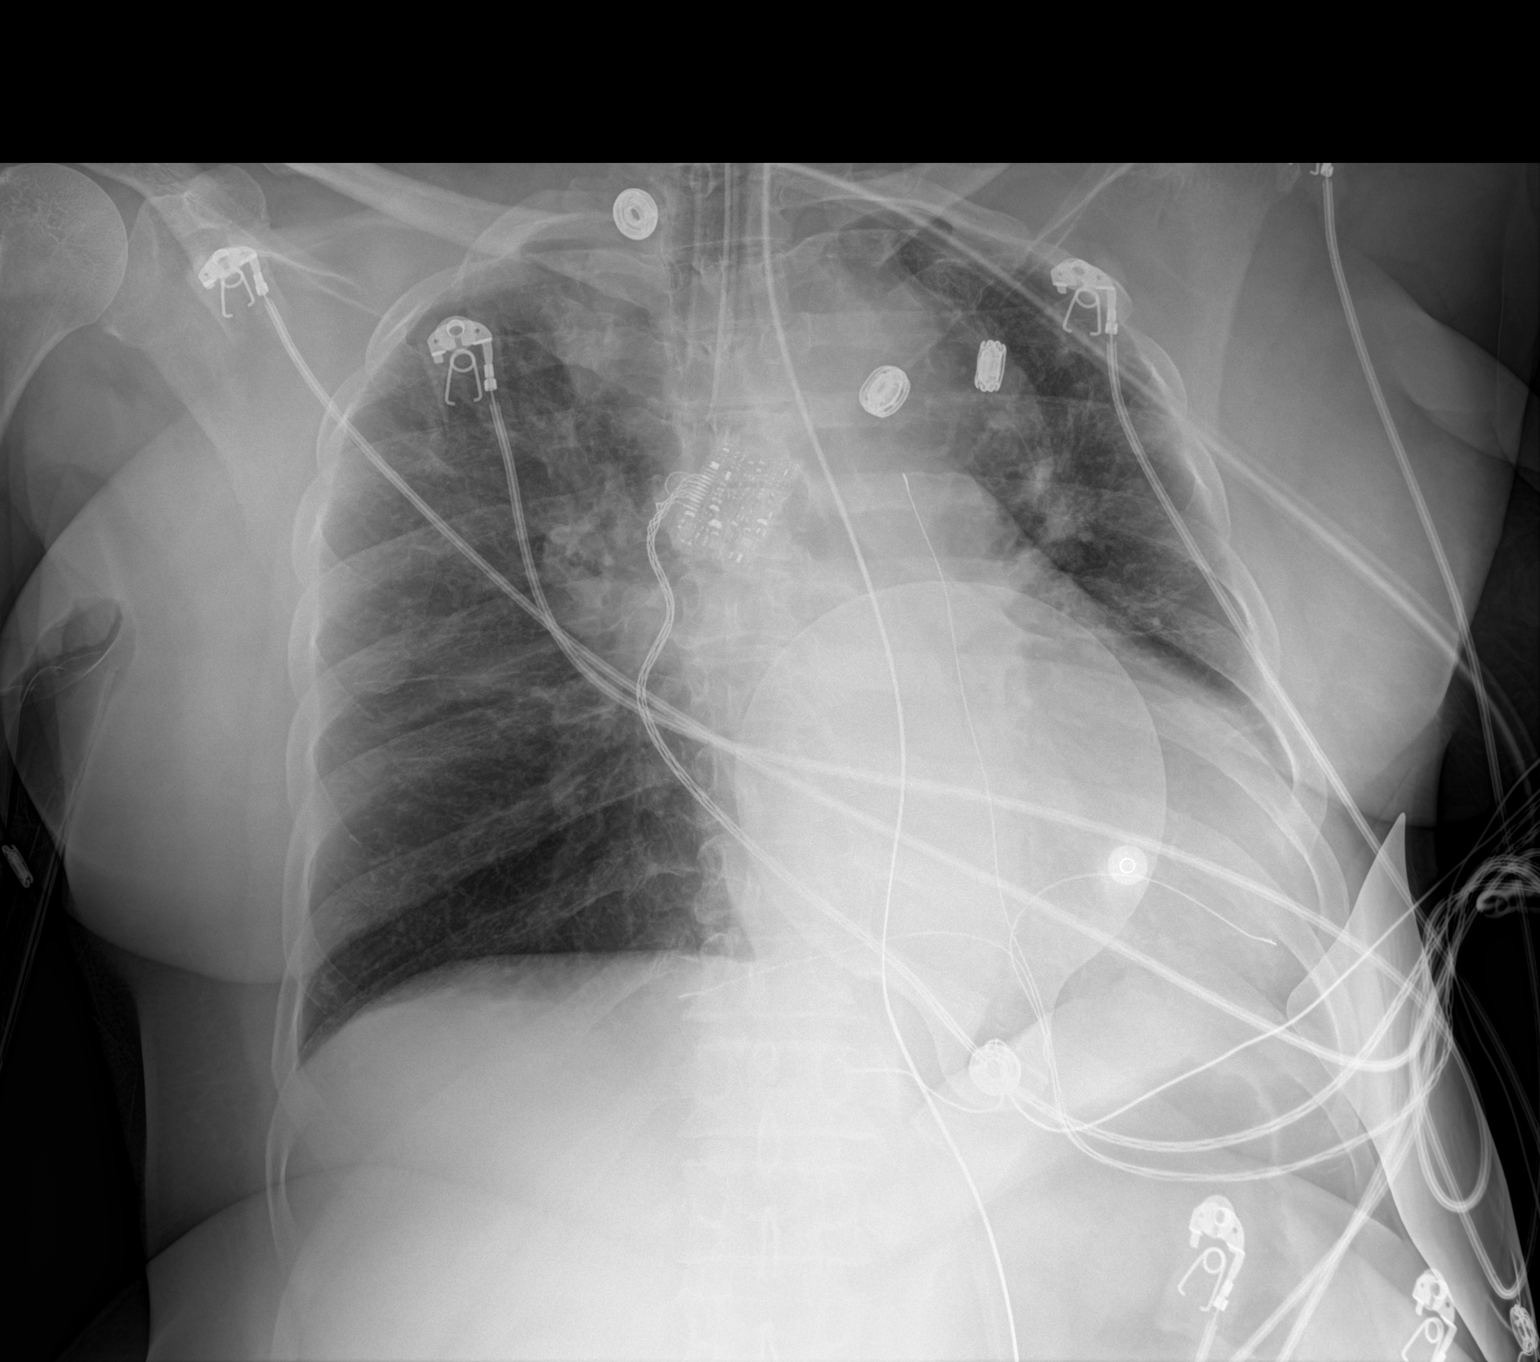

[1 of 1 positions shown; findings below may reference images not displayed]

FINDINGS: Portable AP supine view at [M9] hours. Endotracheal tube tip in good
position between the clavicles and carina. Enteric tube courses to
the abdomen, tip not included. Indistinct superior mediastinal
contour, a including in the aortic arch region. Cardiac size appears
stable and within normal limits. No pneumothorax, pulmonary edema or
consolidation evident on this supine view. There is hypo ventilation
at the left lung base.

Mildly gas distended stomach. No acute osseous abnormality
identified.
IMPRESSION: 1. Satisfactory ET tube and visible enteric tube.
2. Indistinct superior mediastinal contours, nonspecific. Unclear
whether this is related to bilateral superior perihilar lung opacity
(such as due to aspiration in this setting), or an abnormal
mediastinum. Chest CT would best evaluate further.
3. Left lung base atelectasis suspected.

## 2021-07-06 MED ORDER — DOCUSATE SODIUM 50 MG/5ML PO LIQD
100.0000 mg | Freq: Two times a day (BID) | ORAL | Status: DC | PRN
  Administered 2021-07-08 – 2021-07-14 (×2): 100 mg
  Filled 2021-07-06 (×2): qty 10

## 2021-07-06 MED ORDER — SODIUM CHLORIDE 0.9 % IV SOLN
20.0000 ug | Freq: Once | INTRAVENOUS | Status: AC
  Administered 2021-07-06: 20 ug via INTRAVENOUS
  Filled 2021-07-06: qty 5

## 2021-07-06 MED ORDER — DOCUSATE SODIUM 100 MG PO CAPS: 100.0000 mg | ORAL_CAPSULE | Freq: Two times a day (BID) | ORAL | Status: DC | PRN

## 2021-07-06 MED ORDER — SUCCINYLCHOLINE CHLORIDE 200 MG/10ML IV SOSY
PREFILLED_SYRINGE | INTRAVENOUS | Status: AC
  Filled 2021-07-06: qty 10

## 2021-07-06 MED ORDER — ETOMIDATE 2 MG/ML IV SOLN
INTRAVENOUS | Status: AC
  Filled 2021-07-06: qty 20

## 2021-07-06 MED ORDER — ONDANSETRON HCL 4 MG/2ML IJ SOLN: 4.0000 mg | Freq: Four times a day (QID) | INTRAMUSCULAR | Status: DC | PRN

## 2021-07-06 MED ORDER — STROKE: EARLY STAGES OF RECOVERY BOOK: Freq: Once | Status: AC

## 2021-07-06 MED ORDER — HYDRALAZINE HCL 20 MG/ML IJ SOLN: 20.0000 mg | Freq: Once | INTRAMUSCULAR | Status: DC

## 2021-07-06 MED ORDER — SODIUM CHLORIDE 3 % IV SOLN
INTRAVENOUS | Status: DC
  Filled 2021-07-06: qty 500

## 2021-07-06 MED ORDER — ETOMIDATE 2 MG/ML IV SOLN
INTRAVENOUS | Status: AC | PRN
  Administered 2021-07-06: 20 mg via INTRAVENOUS

## 2021-07-06 MED ORDER — SODIUM CHLORIDE 3 % IV SOLN
INTRAVENOUS | Status: DC
  Filled 2021-07-06 (×4): qty 500

## 2021-07-06 MED ORDER — KETAMINE HCL 50 MG/5ML IJ SOSY
PREFILLED_SYRINGE | INTRAMUSCULAR | Status: AC
  Filled 2021-07-06: qty 5

## 2021-07-06 MED ORDER — POLYETHYLENE GLYCOL 3350 17 G PO PACK
17.0000 g | PACK | Freq: Every day | ORAL | Status: DC | PRN
  Administered 2021-07-10: 17 g
  Filled 2021-07-06: qty 1

## 2021-07-06 MED ORDER — ACETAMINOPHEN 325 MG PO TABS: 650.0000 mg | ORAL_TABLET | ORAL | Status: DC | PRN

## 2021-07-06 MED ORDER — ROCURONIUM BROMIDE 10 MG/ML (PF) SYRINGE
PREFILLED_SYRINGE | INTRAVENOUS | Status: AC
  Filled 2021-07-06: qty 10

## 2021-07-06 MED ORDER — MIDAZOLAM HCL 2 MG/2ML IJ SOLN
INTRAMUSCULAR | Status: AC
  Filled 2021-07-06: qty 2

## 2021-07-06 MED ORDER — ACETAMINOPHEN 160 MG/5ML PO SOLN
650.0000 mg | ORAL | Status: DC | PRN
  Administered 2021-07-11 – 2021-07-17 (×3): 650 mg
  Filled 2021-07-06 (×3): qty 20.3

## 2021-07-06 MED ORDER — PROPOFOL 1000 MG/100ML IV EMUL
0.0000 ug/kg/min | INTRAVENOUS | Status: DC
  Administered 2021-07-06: 10 ug/kg/min via INTRAVENOUS
  Administered 2021-07-07: 20 ug/kg/min via INTRAVENOUS
  Filled 2021-07-06 (×2): qty 100

## 2021-07-06 MED ORDER — ROCURONIUM BROMIDE 50 MG/5ML IV SOLN
INTRAVENOUS | Status: AC | PRN
  Administered 2021-07-06: 100 mg via INTRAVENOUS

## 2021-07-06 MED ORDER — POTASSIUM CHLORIDE 10 MEQ/100ML IV SOLN
10.0000 meq | INTRAVENOUS | Status: AC
Start: 2021-07-06 — End: 2021-07-06
  Administered 2021-07-06 (×4): 10 meq via INTRAVENOUS
  Filled 2021-07-06 (×4): qty 100

## 2021-07-06 MED ORDER — SODIUM CHLORIDE 3 % IV BOLUS
250.0000 mL | Freq: Once | INTRAVENOUS | Status: AC
  Administered 2021-07-06: 250 mL via INTRAVENOUS
  Filled 2021-07-06: qty 500

## 2021-07-06 MED ORDER — ACETAMINOPHEN 650 MG RE SUPP: 650.0000 mg | RECTAL | Status: DC | PRN

## 2021-07-06 MED ORDER — CLEVIDIPINE BUTYRATE 0.5 MG/ML IV EMUL
0.0000 mg/h | INTRAVENOUS | Status: DC
  Administered 2021-07-06: 24 mg/h via INTRAVENOUS
  Administered 2021-07-06 (×2): 21 mg/h via INTRAVENOUS
  Administered 2021-07-06: 1 mg/h via INTRAVENOUS
  Administered 2021-07-06: 21 mg/h via INTRAVENOUS
  Administered 2021-07-06: 32 mg/h via INTRAVENOUS
  Administered 2021-07-06: 24 mg/h via INTRAVENOUS
  Administered 2021-07-06: 32 mg/h via INTRAVENOUS
  Administered 2021-07-06: 24 mg/h via INTRAVENOUS
  Administered 2021-07-07 – 2021-07-08 (×17): 32 mg/h via INTRAVENOUS
  Administered 2021-07-08: 14 mg/h via INTRAVENOUS
  Administered 2021-07-08 – 2021-07-10 (×9): 32 mg/h via INTRAVENOUS
  Administered 2021-07-10: 20 mg/h via INTRAVENOUS
  Administered 2021-07-10: 21 mg/h via INTRAVENOUS
  Administered 2021-07-10: 2 mg/h via INTRAVENOUS
  Administered 2021-07-10 (×2): 32 mg/h via INTRAVENOUS
  Administered 2021-07-11: 4 mg/h via INTRAVENOUS
  Filled 2021-07-06 (×5): qty 100
  Filled 2021-07-06: qty 200
  Filled 2021-07-06: qty 100
  Filled 2021-07-06: qty 200
  Filled 2021-07-06: qty 50
  Filled 2021-07-06: qty 100
  Filled 2021-07-06: qty 200
  Filled 2021-07-06: qty 100
  Filled 2021-07-06: qty 300
  Filled 2021-07-06: qty 200
  Filled 2021-07-06 (×4): qty 100
  Filled 2021-07-06: qty 200
  Filled 2021-07-06: qty 100
  Filled 2021-07-06: qty 400
  Filled 2021-07-06 (×3): qty 100
  Filled 2021-07-06: qty 50
  Filled 2021-07-06 (×4): qty 100
  Filled 2021-07-06: qty 50

## 2021-07-06 MED ORDER — FENTANYL CITRATE PF 50 MCG/ML IJ SOSY
PREFILLED_SYRINGE | INTRAMUSCULAR | Status: AC
  Filled 2021-07-06: qty 2

## 2021-07-06 MED ORDER — CHLORHEXIDINE GLUCONATE CLOTH 2 % EX PADS
6.0000 | MEDICATED_PAD | Freq: Every day | CUTANEOUS | Status: DC
  Administered 2021-07-06 – 2021-07-17 (×10): 6 via TOPICAL

## 2021-07-06 MED ORDER — PANTOPRAZOLE SODIUM 40 MG IV SOLR
40.0000 mg | Freq: Every day | INTRAVENOUS | Status: DC
  Administered 2021-07-06 – 2021-07-07 (×2): 40 mg via INTRAVENOUS
  Filled 2021-07-06 (×2): qty 40

## 2021-07-06 MED ORDER — SENNOSIDES-DOCUSATE SODIUM 8.6-50 MG PO TABS
1.0000 | ORAL_TABLET | Freq: Two times a day (BID) | ORAL | Status: DC
  Administered 2021-07-07 – 2021-07-18 (×19): 1
  Filled 2021-07-06 (×20): qty 1

## 2021-07-06 MED ORDER — POTASSIUM CHLORIDE 20 MEQ PO PACK: 40.0000 meq | PACK | Freq: Once | ORAL | Status: DC

## 2021-07-06 MED ORDER — HYDRALAZINE HCL 20 MG/ML IJ SOLN
INTRAMUSCULAR | Status: AC
  Filled 2021-07-06: qty 2

## 2021-07-06 MED ORDER — INSULIN ASPART 100 UNIT/ML IJ SOLN
0.0000 [IU] | INTRAMUSCULAR | Status: DC
  Administered 2021-07-06: 1 [IU] via SUBCUTANEOUS
  Administered 2021-07-06 (×2): 2 [IU] via SUBCUTANEOUS
  Administered 2021-07-06 – 2021-07-07 (×5): 1 [IU] via SUBCUTANEOUS
  Administered 2021-07-08: 2 [IU] via SUBCUTANEOUS
  Administered 2021-07-08 – 2021-07-11 (×11): 1 [IU] via SUBCUTANEOUS
  Administered 2021-07-11: 2 [IU] via SUBCUTANEOUS
  Administered 2021-07-12 – 2021-07-17 (×23): 1 [IU] via SUBCUTANEOUS
  Administered 2021-07-17: 2 [IU] via SUBCUTANEOUS
  Administered 2021-07-17 – 2021-07-18 (×3): 1 [IU] via SUBCUTANEOUS

## 2021-07-06 MED ORDER — SODIUM CHLORIDE 0.9 % IV SOLN
2.0000 g | INTRAVENOUS | Status: DC
  Administered 2021-07-06: 2 g via INTRAVENOUS
  Filled 2021-07-06 (×2): qty 20

## 2021-07-06 NOTE — Progress Notes (Signed)
SLP Cancellation Note  Patient Details Name: Robin Navarro MRN: [id number] DOB: 12-Sep-1966   Cancelled treatment:       Reason Eval/Treat Not Completed: Patient not medically ready. Will f/u for readiness.    Britton Perkinson, Katherene Ponto 07/17/2021, 8:20 AM

## 2021-07-06 NOTE — ED Provider Notes (Signed)
[facility] EMERGENCY DEPARTMENT Provider Note   CSN: [id number] Arrival date & time: 08/01/2021  [id number]     History No chief complaint on file.   Robin Navarro is a 55 y.o. female hx of CKD, HTN, previous pontine hemorrhage, here presenting with slurred speech.  Patient went to bed around midnight.  She woke around 4 AM with slurred speech.  Apparently patient became very altered and family was not sure if she is in cardiac arrest performed CPR for several minutes.  When fire department got there, they also performed CPR until EMS got there.  EMS was able to get a pulse.  King airway was placed by EMS.  Patient unable to give any history.  Per EMS, her initial blood pressure was around 300 manually.  The history is provided by the EMS personnel.    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   Hypokalemia 07/19/2020   Elevated troponin 07/19/2020   Abnormal EKG 07/19/2020   Visual disturbance 07/19/2020   Hypertensive emergency 07/19/2020   AKI (acute kidney injury) ([place]) 06/13/2020   Normocytic anemia 06/13/2020   Acute kidney injury ([place]) 06/13/2020   Acute bronchitis 02/11/2013   Community acquired pneumonia 02/10/2013   HTN (hypertension) 02/10/2013    Past Surgical History:  Procedure Laterality Date   NO PAST SURGERIES       OB History     Gravida  1   Para  1   Term      Preterm      AB      Living         SAB      IAB      Ectopic      Multiple      Live Births              Family History  Problem Relation Age of Onset   Diabetes Mother    Hypertension Mother     Social History   Tobacco Use   Smoking status: Every Day    Packs/day: 0.01    Types: Cigars, Cigarettes   Smokeless tobacco: Never  Substance Use Topics    Alcohol use: Yes    Comment: 1 every six months   Drug use: Yes    Types: Marijuana    Home Medications Prior to Admission medications   Medication Sig Start Date End Date Taking? Authorizing Provider  acetaminophen (TYLENOL) 500 MG tablet Take 500 mg by mouth as needed for mild pain or moderate pain.    [provider]  amLODipine (NORVASC) 10 MG tablet Take 1 tablet (10 mg total) by mouth daily. 01/27/21   Garvin Fila, MD  hydrALAZINE (APRESOLINE) 25 MG tablet Take 3 tablets (75 mg total) by mouth 3 (three) times daily. 01/27/21   Garvin Fila, MD  isosorbide dinitrate (ISORDIL) 20 MG tablet Take 2 tablets (40 mg total) by mouth three times daily 01/27/21   Garvin Fila, MD  isosorbide-hydrALAZINE (BIDIL) 20-37.5 MG tablet Take 2 tablets by mouth 3 (three) times daily. 01/27/21   Garvin Fila, MD  labetalol (NORMODYNE) 100 MG tablet Take 1 tablet (100 mg total) by mouth 2 (two) times daily. 01/27/21   Garvin Fila, MD  senna-docusate (SENOKOT-S) 8.6-50 MG tablet TAKE 1 TABLET BY MOUTH AT BEDTIME AS NEEDED FOR MILD CONSTIPATION. 07/22/20 07/22/21  Hosie Poisson, MD    Allergies    Lisinopril  Review of Systems   Review of Systems  Neurological:  Positive for speech difficulty.  All other systems reviewed and are negative.  Physical Exam Updated Vital Signs BP (!) 146/93   Pulse 81   Resp 18   Ht '5\' 4"'$  (1.626 m)   LMP 12/23/2013   SpO2 100%   BMI 29.52 kg/m   Physical Exam Vitals and nursing note reviewed.  Constitutional:      Comments: Lethargic and not responsive to pain.  No posturing  HENT:     Nose: Nose normal.     Mouth/Throat:     Mouth: Mucous membranes are dry.  Eyes:     Extraocular Movements: Extraocular movements intact.     Pupils: Pupils are equal, round, and reactive to light.  Cardiovascular:     Rate and Rhythm: Normal rate and regular rhythm.     Pulses: Normal pulses.     Heart sounds: Normal heart sounds.  Pulmonary:      Effort: Pulmonary effort is normal.     Breath sounds: Normal breath sounds.  Abdominal:     General: Abdomen is flat.     Palpations: Abdomen is soft.  Musculoskeletal:        General: Normal range of motion.     Cervical back: Normal range of motion and neck supple.  Skin:    General: Skin is warm.  Neurological:     Comments: Lethargic.  Patient is not responsive to painful stimuli.  No spontaneous movements.  No posturing.  Psychiatric:     Comments: Unable     ED Results / Procedures / Treatments   Labs (all labs ordered are listed, but only abnormal results are displayed) Labs Reviewed  CBC WITH DIFFERENTIAL/PLATELET - Abnormal; Notable for the following components:      Result Value   WBC 11.6 (*)    RBC 3.47 (*)    Hemoglobin 9.8 (*)    HCT 30.6 (*)    RDW 17.1 (*)    Abs Immature Granulocytes 0.17 (*)    All other components within normal limits  I-STAT CHEM 8, ED - Abnormal; Notable for the following components:   BUN 68 (*)    Creatinine, Ser 5.00 (*)    Glucose, Bld 159 (*)    Calcium, Ion 1.12 (*)    Hemoglobin 10.9 (*)    HCT 32.0 (*)    All other components within normal limits  RESP PANEL BY RT-PCR (FLU A&B, COVID) ARPGX2  COMPREHENSIVE METABOLIC PANEL  TROPONIN I (HIGH SENSITIVITY)    EKG EKG Interpretation  Date/Time:  Tuesday July 06 2021 05:39:13 EDT Ventricular Rate:  63 PR Interval:  200 QRS Duration: 101 QT Interval:  576 QTC Calculation: 590 R Axis:   86 Text Interpretation: Sinus arrhythmia Probable left atrial enlargement Borderline repolarization abnormality Prolonged QT interval poor baseline Confirmed by Wandra Arthurs [phone]) on 07/04/2021 5:50:15 AM  Radiology CT HEAD WO CONTRAST (5MM)  Result Date: 07/11/2021 CLINICAL DATA:  55 year old female status cardiac arrest and CPR. EXAM: CT HEAD WITHOUT CONTRAST TECHNIQUE: Contiguous axial images were obtained from the base of the skull through the vertex without intravenous contrast.  COMPARISON:  Head CT 01/22/2021. brain MRI 01/22/2021. FINDINGS: Brain: Relatively large and lobulated acute brainstem hemorrhage. Hyperdense blood products encompass  39 x 26 x 32 mm (AP by transverse by CC) - 16 mL centered at the mid pons and tracking both cephalad into the left midbrain and laterally toward the left cerebellar peduncle. This is distinct from a much smaller right ventral pontine hemorrhage seen in April. There is generalized pontine edema. Mildly effaced basilar cisterns. Cisterna magna remains patent. No convincing intraventricular or extra-axial extension of blood at this time. No ventriculomegaly. No superimposed acute cortically based infarct identified. There is scattered supratentorial white matter hypodensity which is chronic and stable. Vascular: Mild Calcified atherosclerosis at the skull base. No suspicious intracranial vascular hyperdensity. Skull: No acute osseous abnormality identified. Chronic bilateral lamina papyracea fractures. Sinuses/Orbits: Intubated on the scout view. Fluid in the visible pharynx. Chronic lamina papyracea fractures. Mild paranasal sinus fluid and/or mucosal thickening now. Tympanic cavities and mastoids remain clear. Other: No acute orbit or scalp soft tissue finding. Chronic right posterior convexity scalp lipoma. IMPRESSION: 1. Primary Acute Brainstem Hemorrhage, with a relatively large 16 mL volume of hyperdense blood within the pons, tracking cephalad into the left midbrain and laterally towards the left cerebellar peduncle. Pontine edema. 2. No intraventricular or extra-axial extension. No ventriculomegaly. And basilar cisterns remain patent at this time. 3. 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 Critical Value/emergent results were called by telephone at the time of interpretation on 07/07/2021 at 6:16 am to Dr. Shirlyn Goltz , who verbally acknowledged these results. Electronically Signed   By: Genevie Ann M.D.   On: 07/28/2021 06:20    Procedures Procedures   CRITICAL CARE Performed by: Wandra Arthurs   Total critical care time: 40  minutes  Critical care time was exclusive of separately billable procedures and treating other patients.  Critical care was necessary to treat or prevent imminent or life-threatening deterioration.  Critical care was time spent personally by me on the following activities: development of treatment plan with patient and/or surrogate as well as nursing, discussions with consultants, evaluation of patient's response to treatment, examination of patient, obtaining history from patient or surrogate, ordering and performing treatments and interventions, ordering and review of laboratory studies, ordering and review of radiographic studies, pulse oximetry and re-evaluation of patient's condition.   INTUBATION Performed by: Wandra Arthurs  Required items: required blood products, implants, devices, and special equipment available Patient identity confirmed: provided demographic data and hospital-assigned identification number Time out: Immediately prior to procedure a "time out" was called to verify the correct patient, procedure, equipment, support staff and site/side marked as required.  Indications: lethargy, hypoxia   Intubation method: Glidescope Laryngoscopy   Preoxygenation: BVM  Sedatives: 20 mg Etomidate Paralytic: 100 mg rocuronium  Tube Size: 7.5 cuffed  Post-procedure assessment: chest rise and ETCO2 monitor Breath sounds: equal and absent over the epigastrium Tube secured with: ETT holder Chest x-ray interpreted by radiologist and me.  Chest x-ray findings: endotracheal tube in appropriate position  Patient tolerated the procedure well with no immediate complications.    Medications Ordered in ED Medications  etomidate (AMIDATE) injection ( Intravenous  Canceled Entry 07/04/2021 0545)  rocuronium (ZEMURON) injection ( Intravenous Canceled Entry 08/02/2021 0545)  ketamine HCl 50 MG/5ML SOSY (0 mg  Hold 07/27/2021 0609)  succinylcholine (ANECTINE) 200 MG/10ML syringe (0 mg  Hold 07/26/2021 0609)  midazolam (VERSED) 2 MG/2ML injection (0 mg  Hold 07/12/2021 0609)  fentaNYL (SUBLIMAZE) 50 MCG/ML injection (  Not Given 07/14/2021 0609)  clevidipine (CLEVIPREX) infusion 0.5 mg/mL (16 mg/hr Intravenous Rate/Dose Change 07/25/2021 0648)  hydrALAZINE (APRESOLINE) injection 20 mg (0 mg Intravenous Hold 07/17/2021 0609)    ED Course  I have reviewed the triage vital signs and the nursing notes.  Pertinent labs & imaging results that were available during my care of the patient were reviewed by me and considered in my medical decision making (see chart for details).    MDM Rules/Calculators/A&P                          MICHAE PLYMIRE is a 55 y.o. female here with altered mental status.  Patient has pinpoint pupils and is lethargic.  Patient may have lost pulses per family but regained ROSC within 10 minutes and no meds were given by EMS    7:05 AM CT showed pontine bleed with edema.  ET tube was confirmed by chest x-ray.  Patient was started on Cleviprex drip.  Discussed case with Dr. Cheral Marker from neurology to see patient. Also discussed case with Dr. Tamala Julian from critical care to admit.  Patient blood pressure is down to 140 from over 200 on arrival.  Final Clinical Impression(s) / ED Diagnoses Final diagnoses:  None    Rx / DC Orders ED Discharge Orders     None        Drenda Freeze, MD 07/07/2021 [phone]

## 2021-07-06 NOTE — Consult Note (Signed)
[facility]  INPATIENT CONSULTATION  Reason for Consultation: AKI/CKD Requesting Provider: Dr. Cheral Marker  HPI: Robin Navarro is an 55 y.o. 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    Had slurred speech 4am - EMS performed CPR for several min though report of initial BP 300.  She was brought to [facility] where head CT shows large brainstem (pons) hemorrhage.  She is currently intubated in ICU on cleveprex gtt and hypertonic saline gtt.  Plans for repeat imaging in 6 hrs.   Cr 5 on admission. Incidentally COVID +.   She was admitted [facility] in 01/2021 with pontine hemorrhage and HTN emergency.  Cr at that time was in the 3.8 range and outpt nephrology was recommended but sounds like not done.  She was seen by nephrology during a 07/2020 admission for HTN urgency.   PMH: Past Medical History:  Diagnosis Date   Anemia    Chronic kidney disease    Hypertension    PSH: Past Surgical History:  Procedure Laterality Date   NO PAST SURGERIES      Past Medical History:  Diagnosis Date   Anemia    Chronic kidney disease    Hypertension     Medications:  I have reviewed the patient's current medications.   Medications Prior to Admission  Medication Sig Dispense Refill   acetaminophen (TYLENOL) 500 MG tablet Take 500 mg by mouth as needed for mild pain or moderate pain.     amLODipine (NORVASC) 10 MG tablet Take 1 tablet (10 mg total) by mouth daily. 30 tablet 3   hydrALAZINE (APRESOLINE) 25 MG tablet Take 3 tablets (75 mg total) by mouth 3 (three) times daily. 270 tablet 3   isosorbide dinitrate (ISORDIL) 20 MG tablet Take 2 tablets (40 mg total) by mouth three times daily (Patient taking differently: Take 40 mg by mouth 3 (three) times daily.) 180 tablet 3   isosorbide-hydrALAZINE (BIDIL) 20-37.5 MG tablet Take 2 tablets by mouth 3 (three) times daily. 180  tablet 2   labetalol (NORMODYNE) 100 MG tablet Take 1 tablet (100 mg total) by mouth 2 (two) times daily. 60 tablet 2   senna-docusate (SENOKOT-S) 8.6-50 MG tablet TAKE 1 TABLET BY MOUTH AT BEDTIME AS NEEDED FOR MILD CONSTIPATION. 10 tablet 0    ALLERGIES:   Allergies  Allergen Reactions   Lisinopril Cough    FAM HX: Family History  Problem Relation Age of Onset   Diabetes Mother    Hypertension Mother     Social History:   reports that she has been smoking cigars. She has been smoking an average of .01 packs per day. She has never used smokeless tobacco. She reports current alcohol use. She reports current drug use. Drug: Marijuana.  ROS: unable to obtain due to pt factors  Blood pressure (!) 154/95, pulse 75, resp. rate 18, height '5\' 4"'$  (1.626 m), weight 78 kg, last menstrual period 12/23/2013, SpO2 100 %. PHYSICAL EXAM: Gen: on vent, comatose, no sedation  Eyes: does not open ENT: ETT in place CV:  RRR Abd: soft Lungs: mech vent  Extr:  no edema Neuro: does not arouse Skin: no rashes   Results for orders placed or performed during the hospital encounter of 07/10/2021 (from the past 48 hour(s))  CBC with Differential/Platelet     Status: Abnormal   Collection Time: 07/08/2021  5:49 AM  Result Value Ref Range  WBC 11.6 (H) 4.0 - 10.5 K/uL   RBC 3.47 (L) 3.87 - 5.11 MIL/uL   Hemoglobin 9.8 (L) 12.0 - 15.0 g/dL   HCT 30.6 (L) 36.0 - 46.0 %   MCV 88.2 80.0 - 100.0 fL   MCH 28.2 26.0 - 34.0 pg   MCHC 32.0 30.0 - 36.0 g/dL   RDW 17.1 (H) 11.5 - 15.5 %   Platelets 198 150 - 400 K/uL   nRBC 0.0 0.0 - 0.2 %   Neutrophils Relative % 59 %   Neutro Abs 6.8 1.7 - 7.7 K/uL   Lymphocytes Relative 32 %   Lymphs Abs 3.8 0.7 - 4.0 K/uL   Monocytes Relative 6 %   Monocytes Absolute 0.7 0.1 - 1.0 K/uL   Eosinophils Relative 1 %   Eosinophils Absolute 0.2 0.0 - 0.5 K/uL   Basophils Relative 0 %   Basophils Absolute 0.0 0.0 - 0.1 K/uL   Immature Granulocytes 2 %   Abs Immature  Granulocytes 0.17 (H) 0.00 - 0.07 K/uL    Comment: Performed at [facility] [address], [place], [place] [address]  Comprehensive metabolic panel     Status: Abnormal   Collection Time: 07/24/2021  5:49 AM  Result Value Ref Range   Sodium 138 135 - 145 mmol/L   Potassium 3.6 3.5 - 5.1 mmol/L   Chloride 103 98 - 111 mmol/L   CO2 24 22 - 32 mmol/L   Glucose, Bld 159 (H) 70 - 99 mg/dL    Comment: Glucose reference range applies only to samples taken after fasting for at least 8 hours.   BUN 62 (H) 6 - 20 mg/dL   Creatinine, Ser 4.90 (H) 0.44 - 1.00 mg/dL   Calcium 9.3 8.9 - 10.3 mg/dL   Total Protein 7.5 6.5 - 8.1 g/dL   Albumin 3.7 3.5 - 5.0 g/dL   AST 124 (H) 15 - 41 U/L   ALT 82 (H) 0 - 44 U/L   Alkaline Phosphatase 95 38 - 126 U/L   Total Bilirubin 0.5 0.3 - 1.2 mg/dL   GFR, Estimated 10 (L) >60 mL/min    Comment: (NOTE) Calculated using the CKD-EPI Creatinine Equation (2021)    Anion gap 11 5 - 15    Comment: Performed at [facility], [place] [address], [place], [place] [address]  Troponin I (High Sensitivity)     Status: Abnormal   Collection Time: 07/19/2021  5:49 AM  Result Value Ref Range   Troponin I (High Sensitivity) 42 (H) <18 ng/L    Comment: (NOTE) Elevated high sensitivity troponin I (hsTnI) values and significant  changes across serial measurements may suggest ACS but many other  chronic and acute conditions are known to elevate hsTnI results.  Refer to the "Links" section for chest pain algorithms and additional  guidance. Performed at [facility], [place] [address] [place], [place] [address]   Protime-INR     Status: None   Collection Time: 07/15/2021  5:49 AM  Result Value Ref Range   Prothrombin Time 13.3 11.4 - 15.2 seconds   INR 1.0 0.8 - 1.2    Comment: (NOTE) INR goal varies based on device and disease states. Performed at [facility], [place] [address], [place], [place] [address]   APTT     Status: None   Collection Time:  07/26/2021  5:49 AM  Result Value Ref Range   aPTT 31 24 - 36 seconds  Comment: Performed at [facility], [place] [address], [place], [place] [address]  I-stat chem 8, ED (not at [facility] or [facility])     Status: Abnormal   Collection Time: 07/25/2021  5:56 AM  Result Value Ref Range   Sodium 140 135 - 145 mmol/L   Potassium 3.6 3.5 - 5.1 mmol/L   Chloride 105 98 - 111 mmol/L   BUN 68 (H) 6 - 20 mg/dL   Creatinine, Ser 5.00 (H) 0.44 - 1.00 mg/dL   Glucose, Bld 159 (H) 70 - 99 mg/dL    Comment: Glucose reference range applies only to samples taken after fasting for at least 8 hours.   Calcium, Ion 1.12 (L) 1.15 - 1.40 mmol/L   TCO2 25 22 - 32 mmol/L   Hemoglobin 10.9 (L) 12.0 - 15.0 g/dL   HCT 32.0 (L) 36.0 - 46.0 %  Resp Panel by RT-PCR (Flu A&B, Covid) Nasopharyngeal Swab     Status: Abnormal   Collection Time: 07/30/2021  6:11 AM   Specimen: Nasopharyngeal Swab; Nasopharyngeal(NP) swabs in vial transport medium  Result Value Ref Range   SARS Coronavirus 2 by RT PCR POSITIVE (A) NEGATIVE    Comment: RESULT CALLED TO, READ BACK BY AND VERIFIED WITH: RN DODD J. [id number] FCP (NOTE) SARS-CoV-2 target nucleic acids are DETECTED.  The SARS-CoV-2 RNA is generally detectable in upper respiratory specimens during the acute phase of infection. Positive results are indicative of the presence of the identified virus, but do not rule out bacterial infection or co-infection with other pathogens not detected by the test. Clinical correlation with patient history and other diagnostic information is necessary to determine patient infection status. The expected result is Negative.  Fact Sheet for Patients: EntrepreneurPulse.com.au  Fact Sheet for Healthcare Providers: IncredibleEmployment.be  This test is not yet approved or cleared by the [place] FDA and  has been authorized for detection and/or diagnosis of SARS-CoV-2 by FDA under an Emergency Use  Authorization (EUA).  This EUA will remain in effect (meaning this test can be used)  for the duration of  the COVID-19 declaration under Section 564(b)(1) of the Act, 21 U.S.C. section 360bbb-3(b)(1), unless the authorization is terminated or revoked sooner.     Influenza A by PCR NEGATIVE NEGATIVE   Influenza B by PCR NEGATIVE NEGATIVE    Comment: (NOTE) The Xpert Xpress SARS-CoV-2/FLU/RSV plus assay is intended as an aid in the diagnosis of influenza from Nasopharyngeal swab specimens and should not be used as a sole basis for treatment. Nasal washings and aspirates are unacceptable for Xpert Xpress SARS-CoV-2/FLU/RSV testing.  Fact Sheet for Patients: EntrepreneurPulse.com.au  Fact Sheet for Healthcare Providers: IncredibleEmployment.be  This test is not yet approved or cleared by the [place] FDA and has been authorized for detection and/or diagnosis of SARS-CoV-2 by FDA under an Emergency Use Authorization (EUA). This EUA will remain in effect (meaning this test can be used) for the duration of the COVID-19 declaration under Section 564(b)(1) of the Act, 21 U.S.C. section 360bbb-3(b)(1), unless the authorization is terminated or revoked.  Performed at [facility], [place] [address], [place], [place] [address]   I-Stat arterial blood gas, ED     Status: Abnormal   Collection Time: 07/05/2021  7:17 AM  Result Value Ref Range   pH, Arterial 7.476 (H) 7.350 - 7.450   pCO2 arterial 42.2 32.0 - 48.0 mmHg   pO2, Arterial 295 (H) 83.0 - 108.0 mmHg   Bicarbonate 31.1 (H)  20.0 - 28.0 mmol/L   TCO2 32 22 - 32 mmol/L   O2 Saturation 100.0 %   Acid-Base Excess 7.0 (H) 0.0 - 2.0 mmol/L   Sodium 142 135 - 145 mmol/L   Potassium 3.2 (L) 3.5 - 5.1 mmol/L   Calcium, Ion 1.17 1.15 - 1.40 mmol/L   HCT 29.0 (L) 36.0 - 46.0 %   Hemoglobin 9.9 (L) 12.0 - 15.0 g/dL   Patient temperature 98.8 F    Collection site Radial    Drawn by RT     Sample type ARTERIAL   CBG monitoring, ED     Status: Abnormal   Collection Time: 07/28/2021  7:29 AM  Result Value Ref Range   Glucose-Capillary 149 (H) 70 - 99 mg/dL    Comment: Glucose reference range applies only to samples taken after fasting for at least 8 hours.   Comment 1 Notify RN    Comment 2 Document in Chart   Urinalysis, Routine w reflex microscopic Urine, Catheterized     Status: Abnormal   Collection Time: 07/08/2021  9:12 AM  Result Value Ref Range   Color, Urine STRAW (A) YELLOW   APPearance CLEAR CLEAR   Specific Gravity, Urine 1.010 1.005 - 1.030   pH 7.0 5.0 - 8.0   Glucose, UA 50 (A) NEGATIVE mg/dL   Hgb urine dipstick SMALL (A) NEGATIVE   Bilirubin Urine NEGATIVE NEGATIVE   Ketones, ur NEGATIVE NEGATIVE mg/dL   Protein, ur 100 (A) NEGATIVE mg/dL   Nitrite NEGATIVE NEGATIVE   Leukocytes,Ua NEGATIVE NEGATIVE   RBC / HPF 11-20 0 - 5 RBC/hpf   WBC, UA 0-5 0 - 5 WBC/hpf   Bacteria, UA RARE (A) NONE SEEN   Squamous Epithelial / LPF 0-5 0 - 5    Comment: Performed at [facility], [address] [place],  [address]    CT HEAD WO CONTRAST (5MM)  Result Date: 07/18/2021 CLINICAL DATA:  55 year old female status cardiac arrest and CPR. EXAM: CT HEAD WITHOUT CONTRAST TECHNIQUE: Contiguous axial images were obtained from the base of the skull through the vertex without intravenous contrast. COMPARISON:  Head CT 01/22/2021. brain MRI 01/22/2021. FINDINGS: Brain: Relatively large and lobulated acute brainstem hemorrhage. Hyperdense blood products encompass 39 x 26 x 32 mm (AP by transverse by CC) - 16 mL centered at the mid pons and tracking both cephalad into the left midbrain and laterally toward the left cerebellar peduncle. This is distinct from a much smaller right ventral pontine hemorrhage seen in April. There is generalized pontine edema. Mildly effaced basilar cisterns. Cisterna magna remains patent. No convincing intraventricular or extra-axial extension of  blood at this time. No ventriculomegaly. No superimposed acute cortically based infarct identified. There is scattered supratentorial white matter hypodensity which is chronic and stable. Vascular: Mild Calcified atherosclerosis at the skull base. No suspicious intracranial vascular hyperdensity. Skull: No acute osseous abnormality identified. Chronic bilateral lamina papyracea fractures. Sinuses/Orbits: Intubated on the scout view. Fluid in the visible pharynx. Chronic lamina papyracea fractures. Mild paranasal sinus fluid and/or mucosal thickening now. Tympanic cavities and mastoids remain clear. Other: No acute orbit or scalp soft tissue finding. Chronic right posterior convexity scalp lipoma. IMPRESSION: 1. Primary Acute Brainstem Hemorrhage, with a relatively large 16 mL volume of hyperdense blood within the pons, tracking cephalad into the left midbrain and laterally towards the left cerebellar peduncle. Pontine edema. 2. No intraventricular or extra-axial extension. No ventriculomegaly. And basilar cisterns remain patent at this time.  3. 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 Critical Value/emergent results were called by telephone at the time of interpretation on 07/28/2021 at 6:16 am to Dr. Shirlyn Goltz , who verbally acknowledged these results. Electronically Signed   By: Genevie Ann M.D.   On: 07/27/2021 06:20   DG Chest Port 1 View  Result Date: 07/03/2021 CLINICAL DATA:  55 year old female with brainstem hemorrhage, status post CPR. EXAM: PORTABLE CHEST 1 VIEW COMPARISON:  Chest radiographs 02/06/2017 and earlier. FINDINGS: Portable AP supine view at 0555 hours. Endotracheal tube tip in good position between the clavicles and carina. Enteric tube courses to the abdomen, tip not included. Indistinct superior mediastinal contour, a including in the aortic arch region.  Cardiac size appears stable and within normal limits. No pneumothorax, pulmonary edema or consolidation evident on this supine view. There is hypo ventilation at the left lung base. Mildly gas distended stomach. No acute osseous abnormality identified. IMPRESSION: 1. Satisfactory ET tube and visible enteric tube. 2. Indistinct superior mediastinal contours, nonspecific. Unclear whether this is related to bilateral superior perihilar lung opacity (such as due to aspiration in this setting), or an abnormal mediastinum. Chest CT would best evaluate further. 3. Left lung base atelectasis suspected. Electronically Signed   By: Genevie Ann M.D.   On: 07/26/2021 07:02    Assessment/Plan **pontine hemorrrhage: large hemorrhage in setting of uncontrolled HTN.  Currently in ICU on hypertonic saline as dosed per neurology - her CKD is not a contraindication for this therapy.  BP control with IV meds currently. Neuro planning repeat CT at 6hrs to help with prognostication.   **AKI on CKD:  advanced CKD at baseline now with worsening renal function in setting of HTN emergency and head bleed.  No current indications for dialysis.   No indications for further w/u at this time; 06/2020 renal US c/w advanced CKD.   **HTN emergency: acute treatment per PCCM.   **Anemia: Hb in 9s, no acute issue.    **Hypokalemia: can be gently repleted.   Will follow, call me with issues I can help with.   Justin Mend 07/21/2021, 9:57 AM

## 2021-07-06 NOTE — Procedures (Signed)
Central Venous Catheter Insertion Procedure Note  Robin Navarro  JT:[id number]  [birth year]  Date:07/30/2021  Time:10:45 AM   Provider Performing:Aili Casillas   Procedure: Insertion of Non-tunneled Central Venous [phone]) with US guidance [id number])   Indication(s) Medication administration  Consent Risks of the procedure as well as the alternatives and risks of each were explained to the patient and/or caregiver.  Consent for the procedure was obtained and is signed in the bedside chart  Anesthesia Topical only with 1% lidocaine   Timeout Verified patient identification, verified procedure, site/side was marked, verified correct patient position, special equipment/implants available, medications/allergies/relevant history reviewed, required imaging and test results available.  Sterile Technique Maximal sterile technique including full sterile barrier drape, hand hygiene, sterile gown, sterile gloves, mask, hair covering, sterile ultrasound probe cover (if used).  Procedure Description Area of catheter insertion was cleaned with chlorhexidine and draped in sterile fashion.  With real-time ultrasound guidance a central venous catheter was placed into the left internal jugular vein. Nonpulsatile blood flow and easy flushing noted in all ports.  The catheter was sutured in place and sterile dressing applied.  Complications/Tolerance None; patient tolerated the procedure well. Chest X-ray is ordered to verify placement for internal jugular or subclavian cannulation.   Chest x-ray is not ordered for femoral cannulation.  EBL Minimal  Specimen(s) None

## 2021-07-06 NOTE — Progress Notes (Signed)
   Palliative Medicine Inpatient Follow Up Note   Received a call from patients RN, [place] that patients family was ready to proceed with comfort focused care.   I called [name] in regards to this and spoke to both them and their sisters and aunts. We reviewed patients present clinical state. We discussed the various deficits in the setting of patients large pontine ICH. We reviewed that this carries with it a very poor prognosis and that Milena is experiencing multisystem organ failure (lab results reviewed). I shared the concern that she is likely not long for this earth despite any and all interventions.  [name] expresses that they would like to wait until tomorrow afternoon when patients mother arrives for further decisions related to comfort.   In the meanwhile we had a comprehensive discussion in regards to code status. [name] understands that Kaye has endured great damage to her body thus far and would prefer if we do not cause any further harm or injury at this point. They were very tearful though very much in agreement with a DNAR order.   For the time being all current measures will be continued though family is aware that anything could happen at any point from here on out regarding clinical decline. They do request is an acute deterioration is occurring that they are called in a timely manner.   Palliative support was provided and questions answered.   Emotional support provided through therapeutic listening.   Patients family request chaplain for prayer  [place] to meet with the PMT tomorrow around noon for further conversations and decisions regarding comfort oriented care --> If patient declines prior please call family to inform them in a timely manner  Chaplain paged to come by and pray among patient  Ongoing involvement to help Quatasia and her family through this very difficult time  Time In: 1708 Time Out: 1758 Time Spent: 60  additional minutes Greater than 50% of the time was spent in counseling and coordination of care ______________________________________________________________________________________ [place] Team Team Cell Phone: [phone] Please utilize secure chat with additional questions, if there is no response within 30 minutes please call the above phone number  Palliative Medicine Team providers are available by phone from 7am to 7pm daily and can be reached through the team cell phone.  Should this patient require assistance outside of these hours, please call the patient's attending physician.

## 2021-07-06 NOTE — ED Notes (Signed)
Pt brought in Woke up slurring speech. Unknown LKN. 5 Mins CPR with bystander and 4-5 mins with EMS. Hx of stroke. No blood thinners  No meds given.

## 2021-07-06 NOTE — H&P (Signed)
NAME:  Robin Navarro, MRN:  [id number], DOB:  [birth year], LOS: 0 ADMISSION DATE:  07/07/2021, CONSULTATION DATE:  07/05/2021 REFERRING MD:  Darl Householder, CHIEF COMPLAINT:  Slurred speech   Brief History   Large brainstem hemorrhage  History of present illness   55 year old woman w/ hx of kidney disease, HTN, previous pontine hemorrhage presenting with slurred speech that started around 4 AM.  When EMS arrived there was a question of PEA arrest and patient received CPR for several minutes however her initial SBP was 300?  Regardless, she had a king airway placed in field for coma which was exchanged for ETT when arrived to ER.  Stat head CT shows large brainstem hemorrhage.  Neurology consulted, PCCM asked to admit.  Past Medical History  Anemia CKD HTN  Significant Hospital Events   10/4 admitted  Consults:  Neuro NSGY  Procedures:  N/a  CT Head IMPRESSION: 1. Primary Acute Brainstem Hemorrhage, with a relatively large 16 mL volume of hyperdense blood within the pons, tracking cephalad into the left midbrain and laterally towards the left cerebellar peduncle. Pontine edema.   2. No intraventricular or extra-axial extension. No ventriculomegaly. And basilar cisterns remain patent at this time.   3. 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  Interim history/subjective:  Consulted.  Objective   Blood pressure 136/85, pulse 80, resp. rate 18, height '5\' 4"'$  (1.626 m), last menstrual period 12/23/2013, SpO2 100 %.    Vent Mode: PRVC FiO2 (%):  [100 %] 100 % Set Rate:  [18 bmp] 18 bmp Vt Set:  [430 mL] 430 mL PEEP:  [5 cmH20] 5 cmH20  No intake or output data in the 24 hours ending 07/27/2021 0711 There were no vitals filed for this visit.  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  Neurology and NSGY to see, unfortunately may be terminal but will await their input.  Respiratory failure due to above.  Question of OHCA but details unclear and will not change current management  Likely aspiration pneumonitis  AKI on CKD4  Hx poorly controlled HTN  - Cleviprex titrated to SBP 140 - DDAVP x 1 - CTX x 3 days - Vent support, VAP prevention bundle, check ABG - Updated son at bedside - Palliative care consult, await specialist input for prognostication but suspect terminal  Best practice:  Diet: NPO Pain/Anxiety/Delirium protocol (if indicated): PRN VAP protocol (if indicated): in place DVT prophylaxis: SCD GI prophylaxis: PPI Glucose control: SSI Mobility: BR Code Status: full Family Communication: updated son at bedside Disposition: ICU   [place] (weekly)  Pending  Medical Decision Making    Diagnoses that are immediately life threatening include cerebral hemorrhage, respiratory failure Interventions today to address these diagnoses are vent titration Likelihood of life-threatening deterioration without intervention is high.  Labs   CBC: Recent Labs  Lab 07/14/2021 0549 07/16/2021 0556  WBC 11.6*  --   NEUTROABS 6.8  --   HGB 9.8* 10.9*  HCT 30.6* 32.0*  MCV 88.2  --   PLT 198  --     Basic Metabolic Panel: Recent Labs  Lab 07/24/2021 0549 07/08/2021 0556  NA 138 140  K 3.6 3.6  CL 103 105  CO2 24  --   GLUCOSE 159* 159*   BUN 62* 68*  CREATININE 4.90* 5.00*  CALCIUM 9.3  --    GFR: CrCl cannot be calculated (Unknown ideal weight.). Recent Labs  Lab 07/12/2021 0549  WBC 11.6*    Liver Function Tests: Recent Labs  Lab 07/22/2021 0549  AST 124*  ALT 82*  ALKPHOS 95  BILITOT 0.5  PROT 7.5  ALBUMIN 3.7   No results for input(s): LIPASE, AMYLASE in the last 168 hours. No results for input(s): AMMONIA in the last 168 hours.  ABG    Component Value Date/Time   TCO2 25 07/12/2021 0556     Coagulation Profile: No results for input(s): INR, PROTIME in the last 168 hours.  Cardiac Enzymes: No results for input(s): CKTOTAL, CKMB, CKMBINDEX, TROPONINI in the last 168 hours.  HbA1C: Hgb A1c MFr Bld  Date/Time Value Ref Range Status  01/22/2021 05:06 PM 5.3 4.8 - 5.6 % Final    Comment:    (NOTE) Pre diabetes:          5.7%-6.4%  Diabetes:              >6.4%  Glycemic control for   <7.0% adults with diabetes   07/21/2020 02:35 AM 5.0 4.8 - 5.6 % Final    Comment:    (NOTE)         Prediabetes: 5.7 - 6.4         Diabetes: >6.4         Glycemic control for adults with diabetes: <7.0     CBG: No results for input(s): GLUCAP in the last 168 hours.  Review of Systems:   Cannot assess, comatose  Past Medical History  She,  has a past medical history of Anemia, Chronic kidney disease, and Hypertension.   Surgical History    Past Surgical History:  Procedure Laterality Date   NO PAST SURGERIES       Social History   reports that she has been smoking cigars. She has been smoking an average of .01 packs per day. She has never used smokeless tobacco. She reports current alcohol use. She reports current drug use. Drug: Marijuana.   Family History   Her family history includes Diabetes in her mother; Hypertension in her mother.   Allergies Allergies  Allergen Reactions   Lisinopril Cough     Home Medications  Prior to Admission medications   Medication Sig Start Date End Date  Taking? Authorizing Provider  acetaminophen (TYLENOL) 500 MG tablet Take 500 mg by mouth as needed for mild pain or moderate pain.    [provider]  amLODipine (NORVASC) 10 MG tablet Take 1 tablet (10 mg total) by mouth daily. 01/27/21   Garvin Fila, MD  hydrALAZINE (APRESOLINE) 25 MG tablet Take 3 tablets (75 mg total) by mouth 3 (three) times daily. 01/27/21   Garvin Fila, MD  isosorbide dinitrate (ISORDIL) 20 MG tablet Take 2 tablets (40 mg total) by mouth three times daily 01/27/21   Garvin Fila, MD  isosorbide-hydrALAZINE (BIDIL) 20-37.5 MG tablet Take 2 tablets by mouth 3 (  three) times daily. 01/27/21   Garvin Fila, MD  labetalol (NORMODYNE) 100 MG tablet Take 1 tablet (100 mg total) by mouth 2 (two) times daily. 01/27/21   Garvin Fila, MD  senna-docusate (SENOKOT-S) 8.6-50 MG tablet TAKE 1 TABLET BY MOUTH AT BEDTIME AS NEEDED FOR MILD CONSTIPATION. 07/22/20 07/22/21  Hosie Poisson, MD     Critical care time: 35 minutes not including any separately billable procedures

## 2021-07-06 NOTE — ED Notes (Signed)
Report given to Robin Searing, RN

## 2021-07-06 NOTE — Plan of Care (Signed)
GOALS OF CARE DISCUSSION   The Clinical status was relayed to patient's family including son, 2 sisters and a cousin along with palliative care team   Updated and notified of patients medical condition.     Patient remains unresponsive and will not open eyes to command.   Explained to family course of therapy and the modalities  Patient with brain bleed and extent of brain damage was explained to the family  Recommend follow up [place]   Patient with a very high probablity of a very minimal chance of meaningful recovery despite all aggressive and optimal medical therapy.  PATIENT REMAINS FULL CODE     Family are satisfied with Plan of action and management. All questions answered   Additional CC time 25 mins    Jacky Kindle MD [facility] See Amion for pager If no response to pager, please call [phone] until 7pm After 7pm, Please call E-link [phone]

## 2021-07-06 NOTE — TOC CAGE-AID Note (Signed)
Transition of Care [facility]) - CAGE-AID Screening   Patient Details  Name: Robin Navarro MRN: [id number] Date of Birth: June 16, 1966  Transition of Care [facility]) CM/SW Contact:    Jahmier Willadsen C Tarpley-Carter, [place] Phone Number: 08/02/2021, 12:19 PM   Clinical Narrative: Pt is unable to participate in Cage Aid.  Pt is not medically appropriate.  Rawlins Stuard Tarpley-Carter, MSW, LCSW-A Pronouns:  She/Her/Hers [facility] HealthTransitions of Care Clinical Social Worker Direct Number:  [phone] [email or web address]  CAGE-AID Screening: Substance Abuse Screening unable to be completed due to: : Patient unable to participate (Pt is not medically appropriate.)             Substance Abuse Education Offered: No

## 2021-07-06 NOTE — Progress Notes (Signed)
[place] Progress Note Patient Name: Robin Navarro DOB: 1966/06/13 MRN: [id number]   Date of Service  07/28/2021  HPI/Events of Note  Central line curled in the neck, it is not in the vessel.  eICU Interventions  Order to discontinue central line entered, 3 % saline gtt maximum rate reduced to 75 ml / hour since it is running via a peripheral line, plan (discussed with bedside RN) will be for the daytime attending to place another central line.        Kerry Kass Ashwath Lasch 07/04/2021, 11:53 PM

## 2021-07-06 NOTE — ED Notes (Signed)
Pt transported back to rm

## 2021-07-06 NOTE — ED Notes (Signed)
Patient transported to CT 

## 2021-07-06 NOTE — H&P (Signed)
Admission H&P    Chief Complaint: New onset of slurred speech  HPI: Robin Navarro is an 55 y.o. 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 When her sister awoke at about 4:30 PM to get ready for work, she saw the patient sitting up with slurred speech and not moving her right arm, but gesturing with her left and stating with slurred speech that she had a headache. She clutched her head with her left hand, then became verbally unresponsive, with eyes still open. EMS was called and the dispatcher advised CPR, which the patient's son started to administer. On arrival by EMS about 10 minutes later, CPR was continued and the patient was noted to have a pulse about 4-5 minutes later. King airway was placed in the field. Her initial SBP per EMS was about 300. She was emergently intubated in the ER and then sent for a STAT CT head, which revealed a large acute central pontine hemorrhage with associated edema and mass effect.  She is not on any blood thinners.   Renal function was noted to be impaired during her last admission for th epontine hemorrhage in April. She had been advised to be seen by an outpatient Nephrologist, but the patient never arranged for an appointment due to insurance problems.   Past Medical History:  Diagnosis Date   Anemia    Chronic kidney disease    Hypertension     Past Surgical History:  Procedure Laterality Date   NO PAST SURGERIES      Family History  Problem Relation Age of Onset   Diabetes Mother    Hypertension Mother    Social History:  reports that she has been smoking cigars. She has been smoking an average of .01 packs per day. She has  never used smokeless tobacco. She reports current alcohol use. She reports current drug use. Drug: Marijuana.  Allergies:  Allergies  Allergen Reactions   Lisinopril Cough    (Not in a hospital admission) No current facility-administered medications on file prior to encounter.   Current Outpatient Medications on File Prior to Encounter  Medication Sig Dispense Refill   acetaminophen (TYLENOL) 500 MG tablet Take 500 mg by mouth as needed for mild pain or moderate pain.     amLODipine (NORVASC) 10 MG tablet Take 1 tablet (10 mg total) by mouth daily. 30 tablet 3   hydrALAZINE (APRESOLINE) 25 MG tablet Take 3 tablets (75 mg total) by mouth 3 (three) times daily. 270 tablet 3   isosorbide dinitrate (ISORDIL) 20 MG tablet Take 2 tablets (40 mg total) by mouth three times daily (Patient taking differently: Take 40 mg by mouth 3 (three) times daily.) 180 tablet 3   isosorbide-hydrALAZINE (BIDIL) 20-37.5 MG tablet Take 2 tablets by mouth 3 (three) times daily. 180 tablet 2   labetalol (NORMODYNE) 100 MG tablet Take 1 tablet (100 mg total) by mouth 2 (two) times daily. 60 tablet 2   senna-docusate (SENOKOT-S) 8.6-50 MG tablet TAKE 1 TABLET BY MOUTH AT BEDTIME AS NEEDED FOR MILD CONSTIPATION. 10 tablet 0    ROS: Unable to obtain due to comatose state.  Physical Examination: Blood pressure 136/85, pulse 80, resp. rate 18, height 5' 4"  (1.626 m), last menstrual period 12/23/2013, SpO2 100 %.  HEENT-  [place]/AT. Dry mucous membraines.  Cardiovascular - Normal rate and regular rhythm Lungs - Intubated. Prior to intubation, breath sounds normal.  Abdomen - Soft and flat.  Extremities - No edema  Neurologic Examination: Ment: Comatose state with GCS of 3 approximately 2 hours after paralytic administration for emergent intubation. No responses to any external stimuli.  CN: Pinpoint pupils bilaterally. No blink to threat. No oculocephalic reflex with eyes at the midline. No corneal reflex. No gag or  cough reflex. Face flaccidly symmetric.  Motor: Flaccid tone x 4 with no movement to any stimuli. No posturing noted. No jerking or twitching seen.  Sensory: No response to pinch x 4. No response to noxious plantar stimulation. Reflexes: Hypoactive x 4 Cerebellar/Gait: Unable to assess   Results for orders placed or performed during the hospital encounter of 07/10/2021 (from the past 48 hour(s))  CBC with Differential/Platelet     Status: Abnormal   Collection Time: 07/04/2021  5:49 AM  Result Value Ref Range   WBC 11.6 (H) 4.0 - 10.5 K/uL   RBC 3.47 (L) 3.87 - 5.11 MIL/uL   Hemoglobin 9.8 (L) 12.0 - 15.0 g/dL   HCT 30.6 (L) 36.0 - 46.0 %   MCV 88.2 80.0 - 100.0 fL   MCH 28.2 26.0 - 34.0 pg   MCHC 32.0 30.0 - 36.0 g/dL   RDW 17.1 (H) 11.5 - 15.5 %   Platelets 198 150 - 400 K/uL   nRBC 0.0 0.0 - 0.2 %   Neutrophils Relative % 59 %   Neutro Abs 6.8 1.7 - 7.7 K/uL   Lymphocytes Relative 32 %   Lymphs Abs 3.8 0.7 - 4.0 K/uL   Monocytes Relative 6 %   Monocytes Absolute 0.7 0.1 - 1.0 K/uL   Eosinophils Relative 1 %   Eosinophils Absolute 0.2 0.0 - 0.5 K/uL   Basophils Relative 0 %   Basophils Absolute 0.0 0.0 - 0.1 K/uL   Immature Granulocytes 2 %   Abs Immature Granulocytes 0.17 (H) 0.00 - 0.07 K/uL    Comment: Performed at [facility], [address], [place],  [address]  Comprehensive metabolic panel     Status: Abnormal   Collection Time: 07/18/2021  5:49 AM  Result Value Ref Range   Sodium 138 135 - 145 mmol/L   Potassium 3.6 3.5 - 5.1 mmol/L   Chloride 103 98 - 111 mmol/L   CO2 24 22 - 32 mmol/L   Glucose, Bld 159 (H) 70 - 99 mg/dL    Comment: Glucose reference range applies only to samples taken after fasting for at least 8 hours.   BUN 62 (H) 6 - 20 mg/dL   Creatinine, Ser 4.90 (H) 0.44 - 1.00 mg/dL   Calcium 9.3 8.9 - 10.3 mg/dL   Total Protein 7.5 6.5 - 8.1 g/dL   Albumin 3.7 3.5 - 5.0 g/dL   AST 124 (H) 15 - 41 U/L   ALT 82 (H) 0 - 44 U/L   Alkaline  Phosphatase 95 38 - 126 U/L   Total Bilirubin 0.5 0.3 - 1.2 mg/dL   GFR, Estimated 10 (L) >60 mL/min    Comment: (NOTE) Calculated using the CKD-EPI Creatinine Equation (2021)    Anion gap 11 5 - 15    Comment: Performed at [facility], [place] [address] [place], [place]  [address]  Troponin I (High Sensitivity)     Status: Abnormal   Collection Time: 07/09/2021  5:49 AM  Result Value Ref Range   Troponin I (High Sensitivity) 42 (H) <18 ng/L    Comment: (NOTE) Elevated high sensitivity troponin I (hsTnI) values and significant  changes across serial measurements may suggest ACS but many other  chronic and acute conditions are known to elevate hsTnI results.  Refer to the "Links" section for chest pain algorithms and additional  guidance. Performed at [facility], [place] [address], [place], [place] [address]   I-stat chem 8, ED (not at [facility] or [facility])     Status: Abnormal   Collection Time: 07/25/2021  5:56 AM  Result Value Ref Range   Sodium 140 135 - 145 mmol/L   Potassium 3.6 3.5 - 5.1 mmol/L   Chloride 105 98 - 111 mmol/L   BUN 68 (H) 6 - 20 mg/dL   Creatinine, Ser 5.00 (H) 0.44 - 1.00 mg/dL   Glucose, Bld 159 (H) 70 - 99 mg/dL    Comment: Glucose reference range applies only to samples taken after fasting for at least 8 hours.   Calcium, Ion 1.12 (L) 1.15 - 1.40 mmol/L   TCO2 25 22 - 32 mmol/L   Hemoglobin 10.9 (L) 12.0 - 15.0 g/dL   HCT 32.0 (L) 36.0 - 46.0 %   CT HEAD WO CONTRAST (5MM)  Result Date: 07/11/2021 CLINICAL DATA:  55 year old female status cardiac arrest and CPR. EXAM: CT HEAD WITHOUT CONTRAST TECHNIQUE: Contiguous axial images were obtained from the base of the skull through the vertex without intravenous contrast. COMPARISON:  Head CT 01/22/2021. brain MRI 01/22/2021. FINDINGS: Brain: Relatively large and lobulated acute brainstem hemorrhage. Hyperdense blood products encompass 39 x 26 x 32 mm (AP by transverse by CC) - 16 mL centered at the mid pons and  tracking both cephalad into the left midbrain and laterally toward the left cerebellar peduncle. This is distinct from a much smaller right ventral pontine hemorrhage seen in April. There is generalized pontine edema. Mildly effaced basilar cisterns. Cisterna magna remains patent. No convincing intraventricular or extra-axial extension of blood at this time. No ventriculomegaly. No superimposed acute cortically based infarct identified. There is scattered supratentorial white matter hypodensity which is chronic and stable. Vascular: Mild Calcified atherosclerosis at the skull base. No suspicious intracranial vascular hyperdensity. Skull: No acute osseous abnormality identified. Chronic bilateral lamina papyracea fractures. Sinuses/Orbits: Intubated on the scout view. Fluid in the visible pharynx. Chronic lamina papyracea fractures. Mild paranasal sinus fluid and/or mucosal thickening now. Tympanic cavities and mastoids remain clear. Other: No acute orbit or scalp soft tissue finding. Chronic right posterior convexity scalp lipoma. IMPRESSION: 1. Primary Acute Brainstem Hemorrhage, with a relatively large 16 mL volume of hyperdense blood within the pons, tracking cephalad into the left midbrain and laterally towards the left cerebellar peduncle. Pontine edema. 2. No intraventricular or extra-axial extension. No ventriculomegaly. And basilar cisterns remain patent at this time. 3. 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 Critical Value/emergent results were called by telephone at the time of interpretation on 07/17/2021 at 6:16 am to Dr. Shirlyn Goltz , who verbally acknowledged these results. Electronically Signed   By: Genevie Ann M.D.   On: 07/13/2021 06:20   DG Chest Port 1 View  Result Date: 07/13/2021 CLINICAL DATA:  55 year old female  with brainstem hemorrhage, status post CPR. EXAM:  PORTABLE CHEST 1 VIEW COMPARISON:  Chest radiographs 02/06/2017 and earlier. FINDINGS: Portable AP supine view at 0555 hours. Endotracheal tube tip in good position between the clavicles and carina. Enteric tube courses to the abdomen, tip not included. Indistinct superior mediastinal contour, a including in the aortic arch region. Cardiac size appears stable and within normal limits. No pneumothorax, pulmonary edema or consolidation evident on this supine view. There is hypo ventilation at the left lung base. Mildly gas distended stomach. No acute osseous abnormality identified. IMPRESSION: 1. Satisfactory ET tube and visible enteric tube. 2. Indistinct superior mediastinal contours, nonspecific. Unclear whether this is related to bilateral superior perihilar lung opacity (such as due to aspiration in this setting), or an abnormal mediastinum. Chest CT would best evaluate further. 3. Left lung base atelectasis suspected. Electronically Signed   By: Genevie Ann M.D.   On: 07/24/2021 07:02     Assessment: 55 year old female presenting with a large pontine ICH noted on CT obtained she presented for acute unresponsive state, slurred speech and weakness. She was LKN at midnight with new symptoms noted by family when she awoke this morning with slurred speech.  1. Exam reveals findings consistent with her large pontine hemorrhage. GCS of 3.  2. CT head: Primary Acute Brainstem Hemorrhage, with a relatively large 16 mL volume of hyperdense blood within the pons, tracking cephalad into the left midbrain and laterally towards the left cerebellar peduncle. Pontine edema. No intraventricular or extra-axial extension. No ventriculomegaly. Basilar cisterns remain patent at this time. 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 3. CKD with BUN of 60 and Cr of 5. eGFR of 10.  4. Elevated  troponin. Most likely due to the [place], but will need to have troponins cycled.   Plan: 1. Admit to ICU under Neurology service 2. Repeat CT head in 6 hours (ordered) 3. Cardiac telemetry.  4. Frequent neuro checks 5. BP management with clevidipine drip  6. No antiplatelet medications or anticoagulants  7. Hypertonic saline has been ordered (3% IV at a rate of 50 cc/hr).  8. Nephrology consulted. They agree with hypertonic saline.  9. Reviewed CT head images with one member of the patient's family, who is a Marine scientist. Prognosis discussed. Family still absorbing the information regarding the acute ICH. Full scope of care for now. May need goals of care discussion when family feels ready.  10. Repeat CT in 6 hours. Neurology to contact Neurosurgery if hydrocephalus or other surgically treatable changes are seen on repeat imaging.  11. Discussed in sign out with Dr. [name].  30. CCM is consulting for vent management.   60 minutes spent in the emergent neurological evaluation and management of this critically ill patient.   Electronically signed: Dr. Kerney Elbe 07/30/2021, 7:06 AM

## 2021-07-06 NOTE — Progress Notes (Signed)
Pt transported from ED TRAC to CT and back with no complications.

## 2021-07-06 NOTE — Plan of Care (Addendum)
Sodium has not risen much despite hypertonics, suspect some role of desmopressin charted as given in ED; per UpToDate, ~75% should be metabolized given t1/2 of 4-6 hours; though may be as long at 9 hours given her poor renal function per pharmacy.   250 cc bolus 3% now and increase rate from 50 cc/hr to 100 cc/hr. Continue q6hr checks, goal Na 150 - Jack MD-PhD [facility] [phone]  Available 7 AM to 7 PM, outside these hours please contact Neurologist on call listed on AMION

## 2021-07-06 NOTE — Consult Note (Signed)
Palliative Care Consult Note                                  Date: 07/18/2021   Patient Name: Robin Navarro  DOB: 02/29/1966  MRN: [id number]  Age / Sex: 55 y.o., female  PCP: [name], MD Referring Physician: Kerney Elbe, MD  Reason for Consultation: Establishing goals of care  HPI/Patient Profile: 55 y.o. 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 EMS responded with possible PEA arrest requiring CPR in the field for several minutes and noted profound hypertension. Required King airway in the field and converted to ETT in the ER. Stat head CT with large brainstem hemorrhage (approx 16 mL). PCCM, neurology, nephrology have all been consulted.  Noted AKI on CKD, seen by nephrology who is holding on HD given her current condition. Ongoing/planned neurology and neurosurgery evaluation.  Possible aspiration pneumonitis. Remains on the vent, PCCM feels suspected terminal brain bleed but awaiting specialist input. Current plan for 6 hour repeat CT to help with prognostication.  PMT was consulted for goals of care/family discussion given severe/likely terminal condition.  Past Medical History:  Diagnosis Date   Anemia    Chronic kidney disease    Hypertension     Subjective:   This NP Walden Field reviewed medical records, received report from team, assessed the patient and then meet at the patient's bedside to discuss diagnosis, prognosis, GOC, EOL wishes disposition and options.  I met with the patient (who is unresponsive) and her son [name] White at the bedside. I later met for a family meeting with [name] White (son), Jakara Blatter (sister), and Jonelle Sidle (cousin) in the family consult room. The PCCM physician was also present.    Concept of Palliative Care was introduced as specialized medical care for people and their families living  with serious illness.  If focuses on providing relief from the symptoms and stress of a serious illness.  The goal is to improve quality of life for both the patient and the family. Values and goals of care important to patient and family were attempted to be elicited.  Created space and opportunity for patient  and family to explore thoughts and feelings regarding current medical situation   Natural trajectory and current clinical status were discussed. Questions and concerns addressed. Patient  encouraged to call with questions or concerns.    Patient/Family Understanding of Illness: The family understands the patient is very sick and there is unlikely to be a good outcome. They know she has had a previous bleed and has issues with high blood pressure which is also affecting her kidneys. They have been told that the likely best case scenario, if she survives, will be an unresponsive/vegetative state. However, it is also quite likely she will pass away from her brain damage.  Life Review: The patient's family shares that she kept a lot of her health problems to herself, she didn't want others to worry. She was a vibrant, active woman who loves to shot. Her son describes her as short but feisty. She was a joyful person to be around.  Patient Values: Family, faith, independence  Today's Discussion:  [name] shares that while his mom kept her health issues to herself, he has noticed a decline recently including eating less, more isolated during COVID, and losing weight (unintentionally). He understands  that his mom is very sick. We shared details ion the size and location of her bleed and how this carries a very poor prognosis. We shared that she is already losing/lost many of her reflexes (gag, pupils, respirations, body temp) but that her heart is still beating; however, it is unknown if or how long this will continue. We discussed CODE STATUS from the perspective that if she cardiac arrests, that it  is not from a sick heart but rather from the brainstem damage and that we are unable to regenerate this; subsequently she would be unlikely to survive.  [name] shared that his mom would not want to be kept alive artificially. I participated in sharing of more about that patient by her family, including how close they all are. I offered active listening, understanding, emotional support.  I answered all questions and addressed all concerns. The family has decided to keep her a FULL CODE for now. They are waiting for the patient's mom and older brother to arrive before making official decisions. I offered them the PMT contact information should any questions or concerns arise. I provided copied of "Difficult Decisions for Loving People". I encouraged them to keep the conversation going and that we will continue to hope/pray for the best, but try to plan for if things do not go the way we would like them to.  Review of Systems  Unable to perform ROS: Patient unresponsive   Objective:   Primary Diagnoses: Present on Admission:  Brainstem hemorrhage ([place])  ICH (intracerebral hemorrhage) ([place])   Physical Exam Vitals and nursing note reviewed.  Constitutional:      Appearance: She is obese. She is ill-appearing.     Interventions: She is intubated.  HENT:     Head: Normocephalic and atraumatic.  Eyes:     Pupils:     Right eye: Pupil is not reactive.     Left eye: Pupil is not reactive.     Comments: Fixed pinpoint pupils not reactive to light  Cardiovascular:     Rate and Rhythm: Normal rate and regular rhythm.     Heart sounds: No murmur heard. Pulmonary:     Effort: She is intubated.     Breath sounds: Normal breath sounds.  Abdominal:     General: Bowel sounds are normal.     Palpations: Abdomen is soft.  Skin:    General: Skin is cool and dry.     Comments: Customer service manager in place  Neurological:     Mental Status: She is unresponsive.    Vital Signs:  BP (!) 154/95   Pulse 75    Resp 18   Ht _0  (1.626 m)   Wt 78 kg   LMP 12/23/2013   SpO2 100%   BMI 29.52 kg/m   Palliative Assessment/Data: 10%    Advanced Care Planning:   Primary Decision Maker: NEXT OF KIN  Code Status/Advance Care Planning: Full code  A discussion was had today regarding advanced directives. Concepts specific to code status, artifical feeding and hydration, continued IV antibiotics and rehospitalization was had.  The difference between a aggressive medical intervention path and a palliative comfort care path for this patient at this time was had.   Decisions/Changes to ACP: Remain full code for not until other family can come and weigh in (likely later today)  Assessment & Plan:   Impression: Very ill 55 year old female who has suffered a large brainstem bleed. No hernaition at this point, but remains  at high risk. Question PEA arrest in the field. Planned repeat CT head around noon today for prognostication assistance. Current seems to be without most critical reflexes. Family aware of how sick she is. Indications that she would not want artificial prolongation of life without ability to interact or have quality. They're waiting on patient's mother and older sibling to arrive (hopefully today) before making decisions on CODE STATUS, etc.  SUMMARY OF RECOMMENDATIONS   Continue to treat the treatable Continue discussion around [place] as more family arrives Continue family support during difficult time Spiritual Care consult has been placed PMT will continue to follow  Symptom Management:  Per primary/PCCM team Palliative medicine is available to assist as needed  Prognosis:  Unable to determine  Discharge Planning:  To Be Determined   Discussed with: Medical team, nursing team, patient's family, spiritual care team    Thank you for allowing [place] to participate in the care of CLARETTA KENDRA PMT will continue to support holistically.  Time Total:  150  Greater than 50%  of this time was spent counseling and coordinating care related to the above assessment and plan.  Signed by: Walden Field, NP Palliative Medicine Team  Team Phone # [phone] (Nights/Weekends)  07/31/2021, 9:13 AM

## 2021-07-06 NOTE — ED Triage Notes (Signed)
Pt brought in Woke up slurring speech. Unknown LKN. 5 Mins CPR with bystander and 4-5 mins with EMS. Hx of stroke. No blood thinners   No meds given.

## 2021-07-06 NOTE — Progress Notes (Signed)
[facility] Progress Note Patient Name: Robin Navarro DOB: October 07, 1965 MRN: [id number]   Date of Service  07/05/2021  HPI/Events of Note  Patient needs stat CXR to check line placement.  eICU Interventions  Stat CXR ordered.        [name] 08/01/2021, 10:10 PM

## 2021-07-06 NOTE — Progress Notes (Signed)
PT Cancellation Note  Patient Details Name: Robin Navarro MRN: [id number] DOB: August 06, 1966   Cancelled Treatment:    Reason Eval/Treat Not Completed: Patient not medically ready - will check back when medically appropriate.   Stacie Glaze, PT DPT Acute Rehabilitation Services Pager [phone]  Office [phone]    [place] 07/05/2021, 10:06 AM

## 2021-07-07 ENCOUNTER — Inpatient Hospital Stay: Payer: Self-pay

## 2021-07-07 DIAGNOSIS — I161 Hypertensive emergency: Secondary | ICD-10-CM

## 2021-07-07 DIAGNOSIS — U071 COVID-19: Secondary | ICD-10-CM

## 2021-07-07 DIAGNOSIS — G936 Cerebral edema: Secondary | ICD-10-CM

## 2021-07-07 DIAGNOSIS — J9600 Acute respiratory failure, unspecified whether with hypoxia or hypercapnia: Secondary | ICD-10-CM

## 2021-07-07 DIAGNOSIS — J9601 Acute respiratory failure with hypoxia: Secondary | ICD-10-CM

## 2021-07-07 DIAGNOSIS — I619 Nontraumatic intracerebral hemorrhage, unspecified: Secondary | ICD-10-CM

## 2021-07-07 LAB — BASIC METABOLIC PANEL
Anion gap: 14 (ref 5–15)
BUN: 66 mg/dL — ABNORMAL HIGH (ref 6–20)
CO2: 17 mmol/L — ABNORMAL LOW (ref 22–32)
Calcium: 9.1 mg/dL (ref 8.9–10.3)
Chloride: 120 mmol/L — ABNORMAL HIGH (ref 98–111)
Creatinine, Ser: 5.31 mg/dL — ABNORMAL HIGH (ref 0.44–1.00)
GFR, Estimated: 9 mL/min — ABNORMAL LOW (ref >60)
Glucose, Bld: 140 mg/dL — ABNORMAL HIGH (ref 70–99)
Potassium: 4.1 mmol/L (ref 3.5–5.1)
Sodium: 151 mmol/L — ABNORMAL HIGH (ref 135–145)

## 2021-07-07 LAB — GLUCOSE, CAPILLARY
Glucose-Capillary: 131 mg/dL — ABNORMAL HIGH (ref 70–99)
Glucose-Capillary: 144 mg/dL — ABNORMAL HIGH (ref 70–99)
Glucose-Capillary: 140 mg/dL — ABNORMAL HIGH (ref 70–99)
Glucose-Capillary: 115 mg/dL — ABNORMAL HIGH (ref 70–99)

## 2021-07-07 LAB — TRIGLYCERIDES: Triglycerides: 869 mg/dL — ABNORMAL HIGH (ref <150)

## 2021-07-07 LAB — SODIUM
Sodium: 152 mmol/L — ABNORMAL HIGH (ref 135–145)
Sodium: 159 mmol/L — ABNORMAL HIGH (ref 135–145)

## 2021-07-07 MED ORDER — FENTANYL CITRATE PF 50 MCG/ML IJ SOSY
25.0000 ug | PREFILLED_SYRINGE | INTRAMUSCULAR | Status: DC | PRN
  Administered 2021-07-07 – 2021-07-10 (×2): 25 ug via INTRAVENOUS
  Filled 2021-07-07 (×2): qty 1

## 2021-07-07 MED ORDER — HYDRALAZINE HCL 50 MG PO TABS
75.0000 mg | ORAL_TABLET | Freq: Three times a day (TID) | ORAL | Status: DC
  Administered 2021-07-07 – 2021-07-10 (×9): 75 mg
  Filled 2021-07-07 (×10): qty 1

## 2021-07-07 MED ORDER — LABETALOL HCL 5 MG/ML IV SOLN: 10.0000 mg | INTRAVENOUS | Status: DC | PRN

## 2021-07-07 MED ORDER — LABETALOL HCL 100 MG PO TABS
100.0000 mg | ORAL_TABLET | Freq: Two times a day (BID) | ORAL | Status: DC
  Administered 2021-07-07 (×2): 100 mg
  Filled 2021-07-07 (×2): qty 1

## 2021-07-07 MED ORDER — CHLORHEXIDINE GLUCONATE 0.12% ORAL RINSE (MEDLINE KIT)
15.0000 mL | Freq: Two times a day (BID) | OROMUCOSAL | Status: DC
  Administered 2021-07-08 – 2021-07-18 (×22): 15 mL via OROMUCOSAL

## 2021-07-07 MED ORDER — AMLODIPINE BESYLATE 10 MG PO TABS
10.0000 mg | ORAL_TABLET | Freq: Every day | ORAL | Status: DC
  Administered 2021-07-07 – 2021-07-18 (×12): 10 mg
  Filled 2021-07-07 (×12): qty 1

## 2021-07-07 MED ORDER — SODIUM CHLORIDE 0.9 % IV SOLN
2.0000 g | INTRAVENOUS | Status: DC
  Administered 2021-07-07: 2 g via INTRAVENOUS
  Filled 2021-07-07: qty 20

## 2021-07-07 MED ORDER — ORAL CARE MOUTH RINSE
15.0000 mL | OROMUCOSAL | Status: DC
  Administered 2021-07-08 – 2021-07-18 (×105): 15 mL via OROMUCOSAL

## 2021-07-07 NOTE — Progress Notes (Signed)
PT Cancellation Note  Patient Details Name: [name] MRN: [id number] DOB: 01/05/1966   Cancelled Treatment:    Reason Eval/Treat Not Completed: Active bedrest order Will assess when medically stable and activity orders updated.  Ranson Belluomini A. Gilford Rile PT, DPT Acute Rehabilitation Services Pager [phone] Office [phone]    Linna Hoff 07/07/2021, 8:03 AM

## 2021-07-07 NOTE — Progress Notes (Signed)
Speech Language Pathology Discharge Patient Details Name: Robin Navarro MRN: [id number] DOB: 31-Mar-1966 Today's Date: 07/07/2021 Time:  -     Patient discharged from SLP services secondary to medical decline - will need to re-order SLP to resume therapy services.  Please see latest therapy progress note for current level of functioning and progress toward goals.    Progress and discharge plan discussed with patient and/or caregiver: Patient unable to participate in discharge planning and no caregivers available  GO    Dewitt Rota, SLP-Student   Dewitt Rota 07/07/2021, 8:01 AM

## 2021-07-07 NOTE — Progress Notes (Signed)
Daily Progress Note   Patient Name: Robin Navarro       Date: 07/07/2021 DOB: Feb 18, 1966  Age: 55 y.o. MRN#: [id number] Attending Physician: Stroke, Md, MD Primary Care Physician: [name], MD Admit Date: 07/23/2021 Length of Stay: 1 day  Reason for Consultation/Follow-up: Establishing goals of care  HPI/Patient Profile:  55 y.o. 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 EMS responded with possible PEA arrest requiring CPR in the field for several minutes and noted profound hypertension. Required King airway in the field and converted to ETT in the ER. Stat head CT with large brainstem hemorrhage (approx 16 mL). PCCM, neurology, nephrology have all been consulted.   Noted AKI on CKD, seen by nephrology who is holding on HD given her current condition. Ongoing/planned neurology and neurosurgery evaluation.  Possible aspiration pneumonitis. Remains on the vent, PCCM feels suspected terminal brain bleed but awaiting specialist input. Current plan for 6 hour repeat CT to help with prognostication.   PMT was consulted for goals of care/family discussion given severe/likely terminal condition.  Subjective:   Subjective: Chart Reviewed. Updates received. Patient Assessed. Created space and opportunity for patient  and family to explore thoughts and feelings regarding current medical situation.  Today's Discussion: Family meeting was had with the patients family (approximately 77 people), including son, brother, uncle, mother, and others. Reviewed encouraging changes where patient is following simple commands. However, stressed that this is still very early on and that she is still high risk for poor/lethal outcomes. Continued treatments to manage BP and brain swelling.   Review of Systems  Unable to perform ROS: Intubated  Constitutional:        Shakes head "no" to question of pain    Objective:   Vital Signs:  BP (!) 161/104   Pulse 91   Temp 97.7 F (36.5 C) (Axillary)   Resp 19   Ht '5\' 4"'$  (1.626 m)   Wt 78 kg   LMP 12/23/2013   SpO2 99%   BMI 29.52 kg/m   Physical Exam: Physical Exam Vitals and nursing note reviewed.  Constitutional:      General: She is sleeping.     Interventions: She is intubated.  HENT:     Head: Normocephalic and atraumatic.  Cardiovascular:     Rate and Rhythm: Tachycardia present.  Pulmonary:     Effort: No respiratory distress. She is intubated.     Breath sounds: No rhonchi.  Abdominal:     General: Abdomen is protuberant. Bowel sounds are normal.     Palpations: Abdomen is soft.  Skin:    General: Skin is warm and dry.  Neurological:     Mental Status: She is easily aroused.    Palliative Assessment/Data: 10% (intubated, NPO)   Assessment & Plan:   Impression: Present on Admission: . Brainstem hemorrhage ([employer]) . ICH (intracerebral hemorrhage) ([place])  Very ill 55 year old female who has suffered a large brainstem bleed. No hernaition at this point, but remains at high risk. Question PEA arrest in the field. Repeat CT head around showed no further bleeding, persistent hematoma, question ventricular changes. Currently has regained some critical reflexes (gag, cough) and still without pupil reaction to light; See neurology full note for details. Has had some responsiveness today, but still high risk for worsening and mortality. Family aware of how sick she is. Indications that she would not want artificial prolongation of  life without ability to interact or have quality. Agreeable to remain DNR. Regarding trach/feeding tube: They understand this may need to be discussed. They're not opped to these in theory, if it'll help her recovery. However, given how unstable the situation is they want to hold off on absolute decisions until needed to allow time for clinical status to evolce/indication of tradjectory.  SUMMARY OF  RECOMMENDATIONS   Continue to treat the treatable Remain DNR Address need for trach/feeding tube when necessary Continued family support Continued spiritual care PMT will continue to follow  Code Status: DNR  Prognosis: Unable to determine  Discharge Planning: To Be Determined  Discussed with: Family, medical team, nursing staff  Thank you for allowing [place] to participate in the care of ARISBET DETERDING PMT will continue to support holistically.  Time Total: 105 min  Visit consisted of counseling and education dealing with the complex and emotionally intense issues of symptom management and palliative care in the setting of serious and potentially life-threatening illness. Greater than 50%  of this time was spent counseling and coordinating care related to the above assessment and plan.  Walden Field, NP Palliative Medicine Team  Team Phone # [phone] (Nights/Weekends)  06/01/2021, 8:17 AM

## 2021-07-07 NOTE — Progress Notes (Signed)
Patient SBP remind slightly over goal of 140. RN contacted night shift stroke MD [place]. Per Dr Cheral Marker the goal should not aggressively chased and the current SBP's for patient are acceptable and to continue with current treatment plan.

## 2021-07-07 NOTE — Progress Notes (Signed)
Pt noted to open eyes spontaneously. Neuro assessment shows that pt can give thumbs up on LUE, spontaneously moves BLE. Pt can look in the general direction of caregiver and not appropriately and to command. [name], MD notified of changes.    07/07/21 0800 07/07/21 0900 07/07/21 1000  Neurological  Level of Consciousness  --   --  Responds to Voice  R Pupil Size (mm)  --   --  2  R Pupil Shape  --   --  Round  R Pupil Reaction  --   --  Sluggish  L Pupil Size (mm)  --   --  2  L Pupil Shape  --   --  Round  L Pupil Reaction  --   --  Sluggish  RUE Motor Response  --   --  No movement to painful stimulus  RUE Motor Strength  --   --  0  LUE Motor Response  --   --  Responds to commands  LUE Motor Strength  --   --  3  RLE Motor Response  --   --  Purposeful movement  RLE Motor Strength  --   --  2  LLE Motor Response  --   --  Purposeful movement  LLE Motor Strength  --   --  3  Glasgow Coma Scale  Eye Opening  --   --  4  Modified Verbal Response (INTUBATED)  --   --  3  Best Motor Response  --   --  6  Glasgow Coma Scale Score  --   --  13  NIH Stroke Scale ( + Modified Stroke Scale Criteria)   LOC Questions (1b. )   +  --   (UTA)  (UTA)  LOC Commands (1c. )   +   --   (UTA)  (UTA)  Best Gaze (2. )  +  --  0 0  Visual (3. )  +  (UTA)  (UTA)  (UTA)  Motor Arm, Left (5a. )   +  --  2 2  Motor Arm, Right (5b. )   +  --  4 4  Motor Leg, Left (6a. )   +  --  2 2  Motor Leg, Right (6b. )   +  --  2 2  Sensory (8. )   +  --   (UTA)  (UTA)  Best Language (9. )   +  --   (UTA)  (UTA)  Extinction/Inattention (11.)   +  --   (UTA)  (UTA)

## 2021-07-07 NOTE — Progress Notes (Signed)
This chaplain responded to PMT NP-Eric referral for continued spiritual care for the Pt. and family.   The chaplain understands from the Pt. RN-Ryan, the Pt. family has gone home.  The chaplain will coordinate with the nursing staff and PMT for future spiritual care.  The chaplain prayed outside the Pt. room.  Chaplain Sallyanne Kuster [phone]

## 2021-07-07 NOTE — Progress Notes (Signed)
Found pt on cpap/psv  14/5.  Change made by CCM.  Pt appears to tol well currently.

## 2021-07-07 NOTE — Progress Notes (Addendum)
STROKE TEAM PROGRESS NOTE   SUBJECTIVE (INTERVAL HISTORY) No family is at the bedside.  Patient still intubated, on ventilation, off sedation, she was able to open eyes, follow simple commands on the left hand and left foot, however still has right hemiplegia.  Eye movement preserved on downward gaze, however no movement in outer directions.  Pupils small but equal.  CT repeat yesterday shows stable hematoma and slightly increased ventricular size.   OBJECTIVE Temp:  [97.7 F (36.5 C)-100 F (37.8 C)] 97.7 F (36.5 C) (10/05 0800) Pulse Rate:  [75-91] 75 (10/05 1030) Resp:  [18-23] 22 (10/05 1030) BP: (127-167)/(63-104) 141/87 (10/05 1030) SpO2:  [96 %-100 %] 96 % (10/05 1120) FiO2 (%):  [40 %] 40 % (10/05 1120)  Recent Labs  Lab 07/31/2021 2021 07/27/2021 2357 07/07/21 0349 07/07/21 0745 07/07/21 1149  GLUCAP 129* 153* 131* 144* 140*   Recent Labs  Lab 07/25/2021 0549 07/24/2021 0556 07/04/2021 0717 07/12/2021 1325 07/31/2021 2152 07/07/21 0559 07/07/21 1013  NA 138 140 142 143 148* 152* 151*  K 3.6 3.6 3.2*  --   --   --  4.1  CL 103 105  --   --   --   --  120*  CO2 24  --   --   --   --   --  17*  GLUCOSE 159* 159*  --   --   --   --  140*  BUN 62* 68*  --   --   --   --  66*  CREATININE 4.90* 5.00*  --   --   --   --  5.31*  CALCIUM 9.3  --   --   --   --   --  9.1   Recent Labs  Lab 07/07/2021 0549  AST 124*  ALT 82*  ALKPHOS 95  BILITOT 0.5  PROT 7.5  ALBUMIN 3.7   Recent Labs  Lab 07/15/2021 0549 07/08/2021 0556 07/18/2021 0717  WBC 11.6*  --   --   NEUTROABS 6.8  --   --   HGB 9.8* 10.9* 9.9*  HCT 30.6* 32.0* 29.0*  MCV 88.2  --   --   PLT 198  --   --    No results for input(s): CKTOTAL, CKMB, CKMBINDEX, TROPONINI in the last 168 hours. Recent Labs    07/10/2021 0549  LABPROT 13.3  INR 1.0   Recent Labs    08/01/2021 0912  COLORURINE STRAW*  LABSPEC 1.010  PHURINE 7.0  GLUCOSEU 50*  HGBUR SMALL*  BILIRUBINUR NEGATIVE  KETONESUR NEGATIVE  PROTEINUR  100*  NITRITE NEGATIVE  LEUKOCYTESUR NEGATIVE       Component Value Date/Time   CHOL 175 01/22/2021 1706   TRIG 869 (H) 07/07/2021 0559   HDL 48 01/22/2021 1706   CHOLHDL 3.6 01/22/2021 1706   VLDL 58 (H) 01/22/2021 1706   LDLCALC 69 01/22/2021 1706   Lab Results  Component Value Date   HGBA1C 5.1 07/25/2021      Component Value Date/Time   LABOPIA NONE DETECTED 07/26/2021 0912   COCAINSCRNUR NONE DETECTED 07/04/2021 0912   LABBENZ NONE DETECTED 07/17/2021 0912   AMPHETMU NONE DETECTED 07/03/2021 0912   THCU NONE DETECTED 07/11/2021 0912   LABBARB NONE DETECTED 07/05/2021 0912    No results for input(s): ETH in the last 168 hours.  I have personally reviewed the radiological images below and agree with the radiology interpretations.  CT HEAD WO CONTRAST (5MM)  Result Date: 07/25/2021  CLINICAL DATA:  Stroke.  Follow-up. EXAM: CT HEAD WITHOUT CONTRAST TECHNIQUE: Contiguous axial images were obtained from the base of the skull through the vertex without intravenous contrast. COMPARISON:  Head CT earlier same day. FINDINGS: Brain: Intraparenchymal hemorrhage within the pons and left midbrain appears similar. The density is becoming less distinct. Approximate size of the hematoma remains 3.5 cm. Edema is seen throughout the pons and midbrain. Small amount of fourth ventricular blood as seen previously. No evidence of additional bleeding since the prior exam. Question if there could be slight increase in fullness of the temporal horns of the lateral ventricles that could be early evidence of hydrocephalus. Otherwise, there are mild chronic small-vessel ischemic changes of the cerebral hemispheric white matter. Vascular: No new vascular finding. Skull: Negative Sinuses/Orbits: Clear/normal Other: None IMPRESSION: No evidence of progressive hemorrhage. Intraparenchymal hemorrhage in the pons and left midbrain with surrounding edema. Small amount of intraventricular blood in the fourth  ventricle. Question slight increase in size of the temporal horns of the lateral ventricles. Electronically Signed   By: Nelson Chimes M.D.   On: 07/16/2021 15:32   CT HEAD WO CONTRAST (5MM)  Result Date: 07/19/2021 CLINICAL DATA:  55 year old female status cardiac arrest and CPR. EXAM: CT HEAD WITHOUT CONTRAST TECHNIQUE: Contiguous axial images were obtained from the base of the skull through the vertex without intravenous contrast. COMPARISON:  Head CT 01/22/2021. brain MRI 01/22/2021. FINDINGS: Brain: Relatively large and lobulated acute brainstem hemorrhage. Hyperdense blood products encompass 39 x 26 x 32 mm (AP by transverse by CC) - 16 mL centered at the mid pons and tracking both cephalad into the left midbrain and laterally toward the left cerebellar peduncle. This is distinct from a much smaller right ventral pontine hemorrhage seen in April. There is generalized pontine edema. Mildly effaced basilar cisterns. Cisterna magna remains patent. No convincing intraventricular or extra-axial extension of blood at this time. No ventriculomegaly. No superimposed acute cortically based infarct identified. There is scattered supratentorial white matter hypodensity which is chronic and stable. Vascular: Mild Calcified atherosclerosis at the skull base. No suspicious intracranial vascular hyperdensity. Skull: No acute osseous abnormality identified. Chronic bilateral lamina papyracea fractures. Sinuses/Orbits: Intubated on the scout view. Fluid in the visible pharynx. Chronic lamina papyracea fractures. Mild paranasal sinus fluid and/or mucosal thickening now. Tympanic cavities and mastoids remain clear. Other: No acute orbit or scalp soft tissue finding. Chronic right posterior convexity scalp lipoma. IMPRESSION: 1. Primary Acute Brainstem Hemorrhage, with a relatively large 16 mL volume of hyperdense blood within the pons, tracking cephalad into the left midbrain and laterally towards the left cerebellar  peduncle. Pontine edema. 2. No intraventricular or extra-axial extension. No ventriculomegaly. And basilar cisterns remain patent at this time. 3. 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 Critical Value/emergent results were called by telephone at the time of interpretation on 07/17/2021 at 6:16 am to Dr. Shirlyn Goltz , who verbally acknowledged these results. Electronically Signed   By: Genevie Ann M.D.   On: 07/29/2021 06:20   DG Chest Port 1 View  Result Date: 07/14/2021 CLINICAL DATA:  Acute respiratory failure, COVID positive. EXAM: PORTABLE CHEST 1 VIEW COMPARISON:  July 06, 2021 (11:10 a.m.) FINDINGS: There is stable endotracheal tube and nasogastric tube positioning. Mild left suprahilar atelectasis and/or infiltrate is noted. This is mildly increased in severity when compared to the prior study.  There is no evidence of a pleural effusion or pneumothorax. The cardiac silhouette is mildly enlarged. Stable prominence of the superior mediastinum is noted with irregular borders along the expected region of the aortic arch. The visualized skeletal structures are unremarkable. IMPRESSION: 1. Mild left suprahilar atelectasis and/or infiltrate, increased in severity when compared to the prior study. 2. Persistent abnormal upper mediastinal contours. Further evaluation with chest CT is recommended. Electronically Signed   By: Virgina Norfolk M.D.   On: 07/24/2021 22:39   DG CHEST PORT 1 VIEW  Result Date: 07/27/2021 CLINICAL DATA:  Line placement, ETT, COVID positive EXAM: PORTABLE CHEST 1 VIEW COMPARISON:  Chest radiograph obtained earlier the same day FINDINGS: The endotracheal tube is in the midthoracic trachea approximately 3.3 cm from the carina. The cardiac silhouette is stable. The upper mediastinum appears widened, similar to the most recent prior study but increased  compared to a more remote study from 2018. There is increased vascular congestion. There is no new focal consolidation. There is no pleural effusion. There is no pneumothorax. The bones are stable. IMPRESSION: 1. Unchanged abnormal upper mediastinal contours. CT chest is recommended for better evaluation. 2. Increased vascular congestion without overt pulmonary edema. Electronically Signed   By: Valetta Mole M.D.   On: 08/01/2021 11:39   DG Chest Port 1 View  Result Date: 07/11/2021 CLINICAL DATA:  55 year old female with brainstem hemorrhage, status post CPR. EXAM: PORTABLE CHEST 1 VIEW COMPARISON:  Chest radiographs 02/06/2017 and earlier. FINDINGS: Portable AP supine view at 0555 hours. Endotracheal tube tip in good position between the clavicles and carina. Enteric tube courses to the abdomen, tip not included. Indistinct superior mediastinal contour, a including in the aortic arch region. Cardiac size appears stable and within normal limits. No pneumothorax, pulmonary edema or consolidation evident on this supine view. There is hypo ventilation at the left lung base. Mildly gas distended stomach. No acute osseous abnormality identified. IMPRESSION: 1. Satisfactory ET tube and visible enteric tube. 2. Indistinct superior mediastinal contours, nonspecific. Unclear whether this is related to bilateral superior perihilar lung opacity (such as due to aspiration in this setting), or an abnormal mediastinum. Chest CT would best evaluate further. 3. Left lung base atelectasis suspected. Electronically Signed   By: Genevie Ann M.D.   On: 07/27/2021 07:02   [place] EKG SITE RITE  Result Date: 07/19/2021 If Site Rite image not attached, placement could not be confirmed due to current cardiac rhythm.    PHYSICAL EXAM  Temp:  [97.7 F (36.5 C)-100 F (37.8 C)] 97.7 F (36.5 C) (10/05 0800) Pulse Rate:  [75-91] 75 (10/05 1030) Resp:  [18-23] 22 (10/05 1030) BP: (127-167)/(63-104) 141/87 (10/05 1030) SpO2:  [96  %-100 %] 96 % (10/05 1120) FiO2 (%):  [40 %] 40 % (10/05 1120)  General - Well nourished, well developed, intubated off sedation.  Ophthalmologic - fundi not visualized due to noncooperation.  Cardiovascular - Regular rate and rhythm.  Neuro - intubated off sedation, eyes open, able to follow simple command on the right. With forced eye opening, eyes in mid position, not blinking to visual threat bilaterally, able to have downward gaze bilaterally, however no movement in other directions, pupil bilaterally 1.5 mm, nonreactive to light.  Corneal reflex absent bilaterally, gag and cough present. Breathing over the vent.  Facial symmetry not able to test due to ET tube.  Tongue protrusion not cooperative.  Spontaneous moving right upper extremity, however not able to against gravity.  Right lower extremity 2/5.  Left upper and lower extremity flaccid. DTR diminished and no babinski. Sensation, coordination not corporative and gait not tested.    ASSESSMENT/PLAN Ms. Robin Navarro is a 55 y.o. 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 No tPA given due to [place].    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 CT head large pontine and midbrain ICH with small IVH CT repeat stable hematoma, questionable slight increase ventricular size Further CT repeat pending family meeting today 2D Echo EF 60 to 65% in 01/2021 LDL pending HgbA1c pending UDS negative SCDs for VTE prophylaxis No antithrombotic prior to admission, now on No antithrombotic due to [place] Therapy recommendations: Pending Disposition: Pending family meeting today  Cerebral edema CT showed focal mass-effect at the brainstem On 3% saline @ 50 Sodium 143->148->152->151 Na goal 150-155 Na monitoring  History of ICH 01/2021 admitted for headache and slurred speech as well as hypertensive  emergency.  CT showed right small pontine ICH.  EF 60 to 65%.  LDL 69, A1c 5.3.  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 at baseline  Hypertensive emergency Unstable Still on Cleviprex maximize dose Labetalol as needed Resume home medication including hydralazine, labetalol, amlodipine BP goal less than 160 Long term BP goal normotensive  Respiratory failure Possible aspirin pneumonia Intubated for airway protection  Still on ventilation CCM on board On Rocephin  Hyperlipidemia Home meds: None LDL pending, goal < 70  AKI on CKD Creatinine 3.86->5.00->5.31 CCM on board On IV fluid  COVID PCR positive Isolation CCM on board  Dysphagia Has OG tube Consider tube feeding if aggressive treatment after family meeting today  Tobacco abuse Current smoker Smoking cessation counseling will be provided  Other Stroke Risk Factors History of THC use, UDS negative this time  Other Active Problems Leukocytosis WBC 11.6  Hospital day # 1  This patient is critically ill due to large brainstem bleeding, severe renal failure, hypertensive emergency, respiratory failure cerebral edema and at significant risk of neurological worsening, death form brain herniation, encephalopathy, renal failure. This patient's care requires constant monitoring of vital signs, hemodynamics, respiratory and cardiac monitoring, review of multiple databases, neurological assessment, discussion with family, other specialists and medical decision making of high complexity. I spent 40 minutes of neurocritical care time in the care of this patient.    Rosalin Hawking, MD PhD Stroke Neurology 07/07/2021 12:24 PM    To contact Stroke Continuity provider, please refer to [email or web address] After hours, contact General Neurology

## 2021-07-07 NOTE — Progress Notes (Signed)
OT Cancellation Note  Patient Details Name: Robin Navarro MRN: [id number] DOB: [birth year]   Cancelled Treatment:    Reason Eval/Treat Not Completed: Active bedrest order (Will assess when medically stable and activity orders updated.)  [facility] Samhitha Rosen, OT/L   Acute OT Clinical Specialist [place] Pager [phone] Office [phone]  07/07/2021, 7:45 AM

## 2021-07-07 NOTE — Consult Note (Signed)
Chaplain provided emotional support via prayer. Paged and requested by family for End of life support

## 2021-07-07 NOTE — H&P (Addendum)
NAME:  Robin Navarro, MRN:  [id number], DOB:  1966-05-28, LOS: 1 ADMISSION DATE:  07/05/2021, CONSULTATION DATE:  07/24/2021 REFERRING MD:  Darl Householder, CHIEF COMPLAINT:  Slurred speech   Brief History   Large brainstem hemorrhage  History of present illness   55 year old woman w/ hx of kidney disease, HTN, previous pontine hemorrhage presenting with slurred speech that started around 4 AM.  When EMS arrived there was a question of PEA arrest and patient received CPR for several minutes however her initial SBP was 300?Marland Kitchen  King airway placed in field for coma which was exchanged for ETT when arrived to ER.  Stat head CT shows large brainstem hemorrhage.    Neurology admitted, PCCM consulted for vent management.   She remains critically ill  Past Medical History  Anemia CKD HTN  Significant Hospital Events   10/4 admitted, CVC placed, removed due to malposition, made DNR by palliative care   Consults:  Neuro NSGY  Procedures:  N/a   Interim history/subjective:  Made DNR by palliative care   CVC placed  but removed to malpositioning  3% at 40m hr, '32mg'$ /hr cleviprex  1500 UOP  Unable to obtain subjective evaluation due to patient status   Objective   Blood pressure (!) 161/104, pulse 91, temperature 97.7 F (36.5 C), temperature source Axillary, resp. rate 19, height '5\' 4"'$  (1.626 m), weight 78 kg, last menstrual period 12/23/2013, SpO2 99 %.    Vent Mode: PRVC FiO2 (%):  [40 %] 40 % Set Rate:  [18 bmp] 18 bmp Vt Set:  [430 mL] 430 mL PEEP:  [5 cmH20] 5 cmH20 Plateau Pressure:  [20 [id number]] 21 cmH20   Intake/Output Summary (Last 24 hours) at 07/07/2021 1009 Last data filed at 07/07/2021 0800 Gross per 24 hour  Intake 3075.05 ml  Output 1560 ml  Net 1515.05 ml   Filed Weights   07/03/2021 0700  Weight: 78 kg    Examination: General: In bed, NAD, appears comfortable HEENT: MM pink/moist, anicteric, atraumatic Neuro: RASS 0, PERRL 1 mm, GCS 10 T, no movement  on right, FC on LUE and LLE CV: S1S2, NSR, no m/r/g appreciated PULM:  clear in the upper lobes, clear in the lower lobes, trachea midline, chest expansion symmetric GI: soft, bsx4 active, non distended Extremities: warm/dry, no pretibial edema, capillary refill less than 3 seconds  Skin: no rashes or lesions noted  CT head: no evidence of progressive hemorrhage CXR: Worsening actelasis compared to previous film.   Resolved Hospital Problem list   N/a  Assessment & Plan:   Pontine ICH Recurrent likely hypertensive brainstem hemorrhage. GCS 3 past 24 hours. On exam GCS 10t, eyes to voice, fc L side. Suspect that neuro exam will worsen in coming days due due to injury. -Neurology primary. Management and workup per neurology -Continue ICU care -Frequent neuro checks -Hypertonic saline at 75cc HR, mgmt per neurology, goal normonatremia 140-145 -Q4H NA check -PC discussion today  -SBP goal less than 160. Continue cleviprex gtt. Neuro resuming home antihypertensives -no antiplatelet medications or anticoagulation  ?OCHA Elevated Troponin Trop 257, suspect secondary to demand and neurologic insult. Unclear if patient arrested per documentation. -Goal normothermia -Supportive care as above -PRN tylenol for fever  Acute respiratory failure secondary to pontine ICH Likely aspiration pneumonitis COVID 19 Believe respiratory failure secondary to pontine ICH, on minimal vent settings, believe COVID+ incidentally.   -LTVV strategy with tidal volumes of 4-8 cc/kg ideal body weight -Goal plateau pressures less than  30 and driving pressures less than 15 -Wean PEEP/FiO2 for SpO2 92-98% -VAP bundle -Daily SAT and SBT -PAD bundle with Propofol gtt -RASS goal 0 to -1 -Follow intermittent CXR and ABG PRN -x5 days ceftriaxone -send tracheal aspirate   Anemia of chronic disease, normocytic HGB 9.8 -Transfuse PRBC if HBG less than 7 -Obtain AM CBC to trend H&H -Monitor for signs of  bleeding  AKI on CKD4 Nephrology consulted, 9/21 renal US with advanced CKD, complicated by HTN emergency -Nephrology following -Ensure renal perfusion. Goal MAP 65 or greater. -Avoid neprotoxic drugs as possible. -Strict I&O's -Follow up AM creatinine  Hx poorly controlled HTN -SBP goal less than 160. Continue cleviprex gtt. Neuro resuming home antihypertensives  Best practice:   Best Practice (right click and "Reselect all SmartList Selections" daily)   Diet/type: NPO w/ oral meds DVT prophylaxis: SCD GI prophylaxis: PPI Central venous access:  N/A Foley:  Yes, and it is still needed Code Status:  DNR Last date of multidisciplinary goals of care discussion [facility] meeting on 10/5 at [place] care time: 31 minutes     Redmond School., MSN, APRN, AGACNP-BC [facility]  07/07/2021 , 10:10 AM  Please see [email or web address] for pager details  If no response, please call [phone] After hours, please call Elink at [phone]

## 2021-07-07 NOTE — Progress Notes (Signed)
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  Currently in ICU on hypertonic saline as dosed per neurology - her CKD is not a contraindication for this therapy.  BP control with IV meds currently.  PMT to meet with family today; anticipate transition to comfort measures with poor prognosis.    **AKI on CKD:  advanced CKD at baseline now with worsening renal function in setting of HTN emergency and head bleed.  Renal function stable with good UOP and normal electrolytes.  No current indications for dialysis.   No indications for further w/u at this time; 06/2020 renal US c/w advanced CKD.    **HTN emergency: acute treatment per PCCM.    **Anemia: Hb in 9s, no acute issue.     Will follow, call me with issues I can help with but I don't see much I can help with for this unfortunate woman.  ------------------------------------------------------------------------------------ Subjective:   Remains criticially ill on vent with significant pontine hemorrhage; no significant change on repeat imaging.  Plans for family meeting with PMT noon per RN.  UOP 1L yesterday.   Objective Vitals:   07/07/21 1000 07/07/21 1015 07/07/21 1030 07/07/21 1120  BP: (!) 161/104 (!) 147/90 (!) 141/87   Pulse: 91 79 75   Resp: 19 20 (!) 22   Temp:      TempSrc:      SpO2: 99% 98% 97% 96%  Weight:      Height:       Physical Exam General: intubated, obtunded Heart: NSR on monitor Lungs: vent 40% fio2 Neuro: obtunded  Additional Objective Labs: Basic Metabolic Panel: Recent Labs  Lab 07/19/2021 0549 07/30/2021 0556 07/13/2021 0717 07/04/2021 1325 07/16/2021 2152 07/07/21 0559 07/07/21 1013  NA 138 140 142   < > 148* 152* 151*  K 3.6 3.6 3.2*  --   --   --  4.1  CL 103 105  --   --   --   --  120*  CO2 24  --   --   --   --   --  17*  GLUCOSE 159* 159*  --   --   --   --  140*  BUN 62* 68*  --   --   --   --  66*   CREATININE 4.90* 5.00*  --   --   --   --  5.31*  CALCIUM 9.3  --   --   --   --   --  9.1   < > = values in this interval not displayed.   Liver Function Tests: Recent Labs  Lab 07/16/2021 0549  AST 124*  ALT 82*  ALKPHOS 95  BILITOT 0.5  PROT 7.5  ALBUMIN 3.7   No results for input(s): LIPASE, AMYLASE in the last 168 hours. CBC: Recent Labs  Lab 07/31/2021 0549 07/26/2021 0556 07/19/2021 0717  WBC 11.6*  --   --   NEUTROABS 6.8  --   --   HGB 9.8* 10.9* 9.9*  HCT 30.6* 32.0* 29.0*  MCV 88.2  --   --   PLT 198  --   --    Blood Culture    Component Value Date/Time   SDES BLOOD RIGHT ARM 02/11/2013 0815   SPECREQUEST BOTTLES DRAWN AEROBIC AND ANAEROBIC 10CC 02/11/2013 0815   CULT NO GROWTH 5 DAYS 02/11/2013 0815   REPTSTATUS 02/17/2013 FINAL 02/11/2013 0815    Cardiac Enzymes: No results for  input(s): CKTOTAL, CKMB, CKMBINDEX, TROPONINI in the last 168 hours. CBG: Recent Labs  Lab 07/14/2021 2021 07/10/2021 2357 07/07/21 0349 07/07/21 0745 07/07/21 1149  GLUCAP 129* 153* 131* 144* 140*   Iron Studies: No results for input(s): IRON, TIBC, TRANSFERRIN, FERRITIN in the last 72 hours. '@lablastinr3'$ @ Studies/Results: CT HEAD WO CONTRAST (5MM)  Result Date: 07/25/2021 CLINICAL DATA:  Stroke.  Follow-up. EXAM: CT HEAD WITHOUT CONTRAST TECHNIQUE: Contiguous axial images were obtained from the base of the skull through the vertex without intravenous contrast. COMPARISON:  Head CT earlier same day. FINDINGS: Brain: Intraparenchymal hemorrhage within the pons and left midbrain appears similar. The density is becoming less distinct. Approximate size of the hematoma remains 3.5 cm. Edema is seen throughout the pons and midbrain. Small amount of fourth ventricular blood as seen previously. No evidence of additional bleeding since the prior exam. Question if there could be slight increase in fullness of the temporal horns of the lateral ventricles that could be early evidence of  hydrocephalus. Otherwise, there are mild chronic small-vessel ischemic changes of the cerebral hemispheric white matter. Vascular: No new vascular finding. Skull: Negative Sinuses/Orbits: Clear/normal Other: None IMPRESSION: No evidence of progressive hemorrhage. Intraparenchymal hemorrhage in the pons and left midbrain with surrounding edema. Small amount of intraventricular blood in the fourth ventricle. Question slight increase in size of the temporal horns of the lateral ventricles. Electronically Signed   By: Nelson Chimes M.D.   On: 07/26/2021 15:32   CT HEAD WO CONTRAST (5MM)  Result Date: 07/12/2021 CLINICAL DATA:  55 year old female status cardiac arrest and CPR. EXAM: CT HEAD WITHOUT CONTRAST TECHNIQUE: Contiguous axial images were obtained from the base of the skull through the vertex without intravenous contrast. COMPARISON:  Head CT 01/22/2021. brain MRI 01/22/2021. FINDINGS: Brain: Relatively large and lobulated acute brainstem hemorrhage. Hyperdense blood products encompass 39 x 26 x 32 mm (AP by transverse by CC) - 16 mL centered at the mid pons and tracking both cephalad into the left midbrain and laterally toward the left cerebellar peduncle. This is distinct from a much smaller right ventral pontine hemorrhage seen in April. There is generalized pontine edema. Mildly effaced basilar cisterns. Cisterna magna remains patent. No convincing intraventricular or extra-axial extension of blood at this time. No ventriculomegaly. No superimposed acute cortically based infarct identified. There is scattered supratentorial white matter hypodensity which is chronic and stable. Vascular: Mild Calcified atherosclerosis at the skull base. No suspicious intracranial vascular hyperdensity. Skull: No acute osseous abnormality identified. Chronic bilateral lamina papyracea fractures. Sinuses/Orbits: Intubated on the scout view. Fluid in the visible pharynx. Chronic lamina papyracea fractures. Mild paranasal sinus  fluid and/or mucosal thickening now. Tympanic cavities and mastoids remain clear. Other: No acute orbit or scalp soft tissue finding. Chronic right posterior convexity scalp lipoma. IMPRESSION: 1. Primary Acute Brainstem Hemorrhage, with a relatively large 16 mL volume of hyperdense blood within the pons, tracking cephalad into the left midbrain and laterally towards the left cerebellar peduncle. Pontine edema. 2. No intraventricular or extra-axial extension. No ventriculomegaly. And basilar cisterns remain patent at this time. 3. 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 Critical Value/emergent results were called by telephone at the time of interpretation on 07/24/2021 at 6:16 am to Dr. Shirlyn Goltz , who verbally acknowledged these results. Electronically Signed   By: Genevie Ann M.D.   On: 07/25/2021 06:20  DG Chest Port 1 View  Result Date: 07/29/2021 CLINICAL DATA:  Acute respiratory failure, COVID positive. EXAM: PORTABLE CHEST 1 VIEW COMPARISON:  July 06, 2021 (11:10 a.m.) FINDINGS: There is stable endotracheal tube and nasogastric tube positioning. Mild left suprahilar atelectasis and/or infiltrate is noted. This is mildly increased in severity when compared to the prior study. There is no evidence of a pleural effusion or pneumothorax. The cardiac silhouette is mildly enlarged. Stable prominence of the superior mediastinum is noted with irregular borders along the expected region of the aortic arch. The visualized skeletal structures are unremarkable. IMPRESSION: 1. Mild left suprahilar atelectasis and/or infiltrate, increased in severity when compared to the prior study. 2. Persistent abnormal upper mediastinal contours. Further evaluation with chest CT is recommended. Electronically Signed   By: Virgina Norfolk M.D.   On: 07/09/2021 22:39   DG CHEST PORT 1  VIEW  Result Date: 07/24/2021 CLINICAL DATA:  Line placement, ETT, COVID positive EXAM: PORTABLE CHEST 1 VIEW COMPARISON:  Chest radiograph obtained earlier the same day FINDINGS: The endotracheal tube is in the midthoracic trachea approximately 3.3 cm from the carina. The cardiac silhouette is stable. The upper mediastinum appears widened, similar to the most recent prior study but increased compared to a more remote study from 2018. There is increased vascular congestion. There is no new focal consolidation. There is no pleural effusion. There is no pneumothorax. The bones are stable. IMPRESSION: 1. Unchanged abnormal upper mediastinal contours. CT chest is recommended for better evaluation. 2. Increased vascular congestion without overt pulmonary edema. Electronically Signed   By: Valetta Mole M.D.   On: 07/28/2021 11:39   DG Chest Port 1 View  Result Date: 07/22/2021 CLINICAL DATA:  55 year old female with brainstem hemorrhage, status post CPR. EXAM: PORTABLE CHEST 1 VIEW COMPARISON:  Chest radiographs 02/06/2017 and earlier. FINDINGS: Portable AP supine view at 0555 hours. Endotracheal tube tip in good position between the clavicles and carina. Enteric tube courses to the abdomen, tip not included. Indistinct superior mediastinal contour, a including in the aortic arch region. Cardiac size appears stable and within normal limits. No pneumothorax, pulmonary edema or consolidation evident on this supine view. There is hypo ventilation at the left lung base. Mildly gas distended stomach. No acute osseous abnormality identified. IMPRESSION: 1. Satisfactory ET tube and visible enteric tube. 2. Indistinct superior mediastinal contours, nonspecific. Unclear whether this is related to bilateral superior perihilar lung opacity (such as due to aspiration in this setting), or an abnormal mediastinum. Chest CT would best evaluate further. 3. Left lung base atelectasis suspected. Electronically Signed   By: Genevie Ann  M.D.   On: 07/27/2021 07:02   [place] EKG SITE RITE  Result Date: 07/09/2021 If Site Rite image not attached, placement could not be confirmed due to current cardiac rhythm.  Medications:  cefTRIAXone (ROCEPHIN)  IV     clevidipine 32 mg/hr (07/07/21 1108)   propofol (DIPRIVAN) infusion Stopped (07/07/21 0750)   sodium chloride (hypertonic) 75 mL/hr at 07/07/21 0800    amLODipine  10 mg Per Tube Daily   Chlorhexidine Gluconate Cloth  6 each Topical Daily   hydrALAZINE  75 mg Per Tube TID   insulin aspart  0-9 Units Subcutaneous Q4H   labetalol  100 mg Per Tube BID   pantoprazole (PROTONIX) IV  40 mg Intravenous QHS   senna-docusate  1 tablet Per Tube BID     Jannifer Hick MD 07/07/2021, 12:28 PM  [facility] Pager: [phone]

## 2021-07-08 DIAGNOSIS — G911 Obstructive hydrocephalus: Secondary | ICD-10-CM

## 2021-07-08 DIAGNOSIS — I469 Cardiac arrest, cause unspecified: Secondary | ICD-10-CM

## 2021-07-08 DIAGNOSIS — J9600 Acute respiratory failure, unspecified whether with hypoxia or hypercapnia: Secondary | ICD-10-CM

## 2021-07-08 LAB — SODIUM
Sodium: 159 mmol/L — ABNORMAL HIGH (ref 135–145)
Sodium: 162 mmol/L (ref 135–145)
Sodium: 163 mmol/L (ref 135–145)
Sodium: 164 mmol/L (ref 135–145)

## 2021-07-08 LAB — CBC WITH DIFFERENTIAL/PLATELET
Abs Immature Granulocytes: 0.28 10*3/uL — ABNORMAL HIGH (ref 0.00–0.07)
Basophils Absolute: 0 10*3/uL (ref 0.0–0.1)
Basophils Relative: 0 %
Eosinophils Absolute: 0 10*3/uL (ref 0.0–0.5)
Eosinophils Relative: 0 %
HCT: 26 % — ABNORMAL LOW (ref 36.0–46.0)
Hemoglobin: 7.9 g/dL — ABNORMAL LOW (ref 12.0–15.0)
Immature Granulocytes: 2 %
Lymphocytes Relative: 23 %
Lymphs Abs: 3.9 10*3/uL (ref 0.7–4.0)
MCH: 27.9 pg (ref 26.0–34.0)
MCHC: 30.4 g/dL (ref 30.0–36.0)
MCV: 91.9 fL (ref 80.0–100.0)
Monocytes Absolute: 1.2 10*3/uL — ABNORMAL HIGH (ref 0.1–1.0)
Monocytes Relative: 7 %
Neutro Abs: 11.9 10*3/uL — ABNORMAL HIGH (ref 1.7–7.7)
Neutrophils Relative %: 68 %
Platelets: 183 10*3/uL (ref 150–400)
RBC: 2.83 MIL/uL — ABNORMAL LOW (ref 3.87–5.11)
RDW: 19.1 % — ABNORMAL HIGH (ref 11.5–15.5)
WBC: 17.3 10*3/uL — ABNORMAL HIGH (ref 4.0–10.5)
nRBC: 0.8 % — ABNORMAL HIGH (ref 0.0–0.2)

## 2021-07-08 LAB — COMPREHENSIVE METABOLIC PANEL
ALT: 48 U/L — ABNORMAL HIGH (ref 0–44)
AST: 43 U/L — ABNORMAL HIGH (ref 15–41)
Albumin: 2.5 g/dL — ABNORMAL LOW (ref 3.5–5.0)
Alkaline Phosphatase: 57 U/L (ref 38–126)
BUN: 57 mg/dL — ABNORMAL HIGH (ref 6–20)
CO2: 21 mmol/L — ABNORMAL LOW (ref 22–32)
Calcium: 9.3 mg/dL (ref 8.9–10.3)
Chloride: >130 mmol/L (ref 98–111)
Creatinine, Ser: 5.54 mg/dL — ABNORMAL HIGH (ref 0.44–1.00)
GFR, Estimated: 9 mL/min — ABNORMAL LOW (ref >60)
Glucose, Bld: 158 mg/dL — ABNORMAL HIGH (ref 70–99)
Potassium: 3.8 mmol/L (ref 3.5–5.1)
Sodium: 162 mmol/L (ref 135–145)
Total Bilirubin: 0.4 mg/dL (ref 0.3–1.2)
Total Protein: 5.9 g/dL — ABNORMAL LOW (ref 6.5–8.1)

## 2021-07-08 LAB — LIPID PANEL
Cholesterol: 157 mg/dL (ref 0–200)
HDL: 37 mg/dL — ABNORMAL LOW (ref >40)
LDL Cholesterol: 50 mg/dL (ref 0–99)
Total CHOL/HDL Ratio: 4.2 RATIO
Triglycerides: 349 mg/dL — ABNORMAL HIGH (ref <150)
VLDL: 70 mg/dL — ABNORMAL HIGH (ref 0–40)

## 2021-07-08 LAB — CBC
HCT: 28.3 % — ABNORMAL LOW (ref 36.0–46.0)
Hemoglobin: 9.1 g/dL — ABNORMAL LOW (ref 12.0–15.0)
MCH: 29.4 pg (ref 26.0–34.0)
MCHC: 32.2 g/dL (ref 30.0–36.0)
MCV: 91.6 fL (ref 80.0–100.0)
Platelets: 196 10*3/uL (ref 150–400)
RBC: 3.09 MIL/uL — ABNORMAL LOW (ref 3.87–5.11)
RDW: 18.8 % — ABNORMAL HIGH (ref 11.5–15.5)
WBC: 13.6 10*3/uL — ABNORMAL HIGH (ref 4.0–10.5)
nRBC: 0.6 % — ABNORMAL HIGH (ref 0.0–0.2)

## 2021-07-08 LAB — TROPONIN I (HIGH SENSITIVITY): Troponin I (High Sensitivity): 86 ng/L — ABNORMAL HIGH (ref <18)

## 2021-07-08 LAB — GLUCOSE, CAPILLARY
Glucose-Capillary: 109 mg/dL — ABNORMAL HIGH (ref 70–99)
Glucose-Capillary: 115 mg/dL — ABNORMAL HIGH (ref 70–99)
Glucose-Capillary: 120 mg/dL — ABNORMAL HIGH (ref 70–99)
Glucose-Capillary: 122 mg/dL — ABNORMAL HIGH (ref 70–99)
Glucose-Capillary: 123 mg/dL — ABNORMAL HIGH (ref 70–99)
Glucose-Capillary: 137 mg/dL — ABNORMAL HIGH (ref 70–99)
Glucose-Capillary: 152 mg/dL — ABNORMAL HIGH (ref 70–99)

## 2021-07-08 LAB — BASIC METABOLIC PANEL
BUN: 58 mg/dL — ABNORMAL HIGH (ref 6–20)
CO2: 18 mmol/L — ABNORMAL LOW (ref 22–32)
Calcium: 9.6 mg/dL (ref 8.9–10.3)
Chloride: >130 mmol/L (ref 98–111)
Creatinine, Ser: 5.27 mg/dL — ABNORMAL HIGH (ref 0.44–1.00)
GFR, Estimated: 9 mL/min — ABNORMAL LOW (ref >60)
Glucose, Bld: 111 mg/dL — ABNORMAL HIGH (ref 70–99)
Potassium: 4.1 mmol/L (ref 3.5–5.1)
Sodium: 161 mmol/L (ref 135–145)

## 2021-07-08 LAB — MAGNESIUM
Magnesium: 2.4 mg/dL (ref 1.7–2.4)
Magnesium: 2.4 mg/dL (ref 1.7–2.4)
Magnesium: 2.3 mg/dL (ref 1.7–2.4)
Magnesium: 2.3 mg/dL (ref 1.7–2.4)

## 2021-07-08 LAB — LACTIC ACID, PLASMA
Lactic Acid, Venous: 1.8 mmol/L (ref 0.5–1.9)
Lactic Acid, Venous: 1 mmol/L (ref 0.5–1.9)

## 2021-07-08 LAB — PHOSPHORUS
Phosphorus: 4.2 mg/dL (ref 2.5–4.6)
Phosphorus: 4.4 mg/dL (ref 2.5–4.6)
Phosphorus: 6 mg/dL — ABNORMAL HIGH (ref 2.5–4.6)

## 2021-07-08 LAB — HEMOGLOBIN A1C
Hgb A1c MFr Bld: 5.1 % (ref 4.8–5.6)
Mean Plasma Glucose: 99.67 mg/dL

## 2021-07-08 MED ORDER — FREE WATER
100.0000 mL | Freq: Four times a day (QID) | Status: DC
  Administered 2021-07-08 – 2021-07-10 (×8): 100 mL

## 2021-07-08 MED ORDER — LABETALOL HCL 100 MG PO TABS
300.0000 mg | ORAL_TABLET | Freq: Three times a day (TID) | ORAL | Status: DC
  Administered 2021-07-08 (×2): 300 mg
  Filled 2021-07-08 (×3): qty 3

## 2021-07-08 MED ORDER — VITAL AF 1.2 CAL PO LIQD
1000.0000 mL | ORAL | Status: DC
  Administered 2021-07-08 – 2021-07-17 (×10): 1000 mL
  Filled 2021-07-08 (×3): qty 1000

## 2021-07-08 MED ORDER — SODIUM CHLORIDE 0.9 % IV SOLN
1.5000 g | Freq: Two times a day (BID) | INTRAVENOUS | Status: DC
  Administered 2021-07-08 – 2021-07-14 (×13): 1.5 g via INTRAVENOUS
  Filled 2021-07-08 (×14): qty 4

## 2021-07-08 MED ORDER — SODIUM CHLORIDE 0.45 % IV SOLN: INTRAVENOUS | Status: DC

## 2021-07-08 MED ORDER — PANTOPRAZOLE SODIUM 40 MG IV SOLR
40.0000 mg | Freq: Two times a day (BID) | INTRAVENOUS | Status: DC
  Administered 2021-07-08 – 2021-07-18 (×20): 40 mg via INTRAVENOUS
  Filled 2021-07-08 (×20): qty 40

## 2021-07-08 MED ORDER — CLONIDINE HCL 0.2 MG PO TABS
0.3000 mg | ORAL_TABLET | Freq: Three times a day (TID) | ORAL | Status: DC
  Administered 2021-07-08 (×2): 0.3 mg
  Filled 2021-07-08 (×3): qty 1

## 2021-07-08 MED ORDER — PROSOURCE TF PO LIQD: 45.0000 mL | Freq: Two times a day (BID) | ORAL | Status: DC

## 2021-07-08 MED ORDER — VITAL HIGH PROTEIN PO LIQD: 1000.0000 mL | ORAL | Status: DC

## 2021-07-08 NOTE — Progress Notes (Signed)
Pharmacy Antibiotic Note  Robin Navarro is a 55 y.o. female admitted on 07/17/2021 with  aspiration pneumonia .  Pharmacy has been consulted for Unasyn dosing.  He is currently on IV ceftriaxone with worsening leukocytosis. SCr elevated.   Plan: -Unasyn 1.5 gm IV Q 12 hours -Monitor CBC, renal fx, cultures and clinical progress  Height: '5\' 4"'$  (162.6 cm) Weight: 78 kg (171 lb 15.3 oz) IBW/kg (Calculated) : 54.7  Temp (24hrs), Avg:98.4 F (36.9 C), Min:98.1 F (36.7 C), Max:98.5 F (36.9 C)  Recent Labs  Lab 07/09/2021 0549 07/07/2021 0556 07/07/21 1013 07/08/21 0354  WBC 11.6*  --   --  13.6*  CREATININE 4.90* 5.00* 5.31* 5.27*    Estimated Creatinine Clearance: 12.5 mL/min (A) (by C-G formula based on SCr of 5.27 mg/dL (H)).    Allergies  Allergen Reactions   Lisinopril Cough    Antimicrobials this admission: Ceftriaxone 10/4 >> 10/6 Unasyn 10/6 >>   Dose adjustments this admission:   Microbiology results: 10/5 TA >> pending  10/5 COVID PCR +   Thank you for allowing pharmacy to be a part of this patient's care.  Albertina Parr, PharmD., BCPS, BCCCP Clinical Pharmacist Please refer to [facility] for unit-specific pharmacist

## 2021-07-08 NOTE — Progress Notes (Addendum)
Pt coded at 1812, received CPR and '1mg'$  epi, ROSC achieved at 1816. Currently pt pulse at 71, BP stable.   Called son but he did not pick up the phone. Called his another number, pt sister picked up and she stated that she is coming.   Pt has not had CT head done yet, she is COVID positive and needed specific room and preparation. However, given the coding event, she is not stable to go down for CT at this time. Will consider in am if she is still stable.  Rosalin Hawking, MD PhD Stroke Neurology 07/08/2021 6:32 PM  ADDENDUM:  Patient sister, son, mom arrived later at bedside.  Updated patient coding event and further management plan.  We discussed about coding status, family like to resend DNR to full code.  Patient was able to follow commands with wiggle toes, thumb up and showing 2 fingers.  We have asked her to say yes with 2 fingers and no with thumb up.  Regarding CPR or DNR, she showed [place] 2 fingers.  Will change CODE STATUS to full code.  Rosalin Hawking, MD PhD Stroke Neurology 07/08/2021 6:57 PM

## 2021-07-08 NOTE — Progress Notes (Signed)
PT Cancellation Note  Patient Details Name: Robin Navarro MRN: [id number] DOB: Jul 26, 1966   Cancelled Treatment:    Reason Eval/Treat Not Completed: Active bedrest order; note pt still on bedrest and potential for comfort measures.  PT will sign off.  Please consult again if needed.     Reginia Naas 07/08/2021, 8:59 AM Magda Kiel, PT Acute Rehabilitation Services [id number] Office:[phone] 07/08/2021

## 2021-07-08 NOTE — Progress Notes (Addendum)
CODE NOTE:  Asystole 1812  CPR x 2 minutes  1814 pulse check - no femoral pulse w/doppler  1814: 1 mg epi given  CPR x 2 minutes  1816: pulse check - patient bounding pulse, now following commands.   Post-code sheet filled out.  Montez Hageman, RN (charge)

## 2021-07-08 NOTE — Progress Notes (Signed)
[place] Progress Note Patient Name: Robin Navarro DOB: 12/22/1965 MRN: [id number]   Date of Service  07/08/2021  HPI/Events of Note  Hypernatremia - Na+ = 162. Patient was on 3% NaCl IV infusion which is now stopped. Goal Na+ = 150-155.   eICU Interventions  Plan: 0.45 NaCl to run IV at 50 mL/hour.  Repeat Na+ with BMP at 5 AM.     Intervention Category Major Interventions: Electrolyte abnormality - evaluation and management  Shenica Holzheimer [place] 07/08/2021, 8:53 PM

## 2021-07-08 NOTE — Progress Notes (Signed)
I was at the bedside interacting with the patient who has been following commands since yesterday afternoon, post family meeting with Palliative Care. I was bladder scanning and repositioning the patient when she bradyed to 38. I attempted to raise the [facility] and she want asystole. No carotid pulse palpated. Despite the patient being a DNR, I instinctually called a code and immediately started CPR. After only 2 minutes of CPR and 1 round of EPI, we obtained ROSC. The resuscitation lasted 4 minutes. Post resuscitation, the patients neuro status returned, responsive to voice and briskly following commands. With family and Dr. [name] at the bedside, it was witnessed that the family and patient wanted to overturn the code status, making the patient now a full code.    As stated by Dr. [name], a Head CT was ordered this morning. I made 2 attempts to complete during the shift, both of which were unsuccessful due to +Covid status and CT being back logged. Patient is currently not stable enough to transport to obtain CT. Will continue to monitor.

## 2021-07-08 NOTE — Progress Notes (Addendum)
NAME:  Robin Navarro, MRN:  [id number], DOB:  [birth year], LOS: 2 ADMISSION DATE:  08/01/2021, CONSULTATION DATE:  07/23/2021 REFERRING MD:  Darl Householder, CHIEF COMPLAINT:  Slurred speech   Brief History   Large brainstem hemorrhage  History of present illness   55 year old woman w/ hx of kidney disease, HTN, previous pontine hemorrhage presenting with slurred speech that started around 4 AM.  When EMS arrived there was a question of PEA arrest and patient received CPR for several minutes however her initial SBP was 300?Marland Kitchen  King airway placed in field for coma which was exchanged for ETT when arrived to ER.  Stat head CT shows large brainstem hemorrhage.    Neurology admitted, PCCM consulted for vent management.   She remains critically ill  Past Medical History  Anemia CKD HTN  Significant Hospital Events   10/4 admitted, CVC placed, removed due to malposition, made DNR by palliative care 10/5 Palliative care [place] meeting- Cont DNR, treat treatable, "address need for trach/feeding tube when needed"  Consults:  Neuro NSGY  Procedures:  N/a  Interim history/subjective:  Cleviprex '32mg'$   Intubated  Tmax 98.4  +2.8L 1.5 L UOP past 24, +2.8L admission  Unable to obtain subjective evaluation due to patient status  Objective   Blood pressure (!) 152/82, pulse 82, temperature 98.4 F (36.9 C), temperature source Oral, resp. rate (!) 25, height '5\' 4"'$  (1.626 m), weight 78 kg, last menstrual period 12/23/2013, SpO2 95 %.    Vent Mode: PRVC FiO2 (%):  [40 %] 40 % Set Rate:  [18 bmp] 18 bmp Vt Set:  [430 mL] 430 mL PEEP:  [5 cmH20] 5 cmH20 Pressure Support:  [14 cmH20] 14 cmH20 Plateau Pressure:  [23 cmH20-26 cmH20] 23 cmH20   Intake/Output Summary (Last 24 hours) at 07/08/2021 0924 Last data filed at 07/08/2021 0800 Gross per 24 hour  Intake 2559.44 ml  Output 1445 ml  Net 1114.44 ml   Filed Weights   07/11/2021 0700  Weight: 78 kg    Examination: General: In bed, NAD, appears  comfortable HEENT: MM pink/moist, anicteric, atraumatic Neuro: RASS 0, PERRL 75m, GCS 10 t opens eyes to voice CV: S1S2, NSR, no m/r/g appreciated PULM:  rhonchi  in the upper lobes, rhonchi  in the lower lobes, trachea midline, chest expansion symmetric GI: soft, bsx4 active, non-tender   Extremities: warm/dry, no pretibial edema, capillary refill less than 3 seconds  Skin: no rashes or lesions noted  Labs:NA 161, WBC 13.6, HGB 9.1, TA reincubated, A1c 5.1  Resolved Hospital Problem list   N/a  Assessment & Plan:   Pontine and left midbrain ICH Small IVH Cerebral edema HX ICH Recurrent likely hypertensive brainstem hemorrhage.CT with edema at brainstem.  Continued GCS 10T. Suspect that neuro exam will worsen in coming days due due to injury. 10/5 [place]- Cont DNR, treat treatable, "address need for trach/feeding tube when needed". A1C 5.1. NA 152>151>159>159>161 -Neurology primary, management and workup per neurology -Continue ICU care -Frequent neuro checks -Hypertonic stopped, continue close monitoring of NA -Q4H NA check -Palliative care following appreciate assistance.  -SBP goal less than 160. Continue cleviprex gtt. On per tube- amlodipine 10, hydralazine '75mg'$  tid, labetalol '300mg'$  tid. Added clonidine 0.3 tid -no antiplatelet medications or anticoagulation -Repeat head imaging per neurology  ?OCHA Elevated Troponin suspect secondary to demand and neurologic insult. Unclear if patient arrested per documentation. -Goal normothermia -Continue supportive care -PRN tylenol for fever  Acute respiratory failure secondary to pontine  ICH Likely aspiration pneumonitis COVID 19 Believe respiratory failure secondary to pontine ICH, on minimal vent settings, believe COVID+ incidentally. No increase in oxygenation needs. WBC no 13.6 remains afebrile. Yellow/white sputum. TA reincubated.  -LTVV strategy with tidal volumes of 4-8 cc/kg ideal body weight -Goal  plateau pressures less than 30 and driving pressures less than 15 -Wean PEEP/FiO2 for SpO2 92-98% -VAP bundle -Daily SAT and SBT -RASS goal 0 to -1 -Follow intermittent CXR and ABG PRN -x5 days ceftriaxone> switched to unasyn -Follow up TA   Anemia of chronic disease, normocytic HGB 9.8>9.1 -Transfuse PRBC if HBG less than 7 -Obtain AM CBC to trend H&H -Monitor for signs of bleeding   AKI on CKD4 Nephrology consulted, 9/21 renal US with advanced CKD, complicated by HTN emergency. 1.5L uop past 24 -Nephrology following. Palliative care [place] meeting- Cont DNR, treat treatable, "address need for trach/feeding tube when needed". Appreciate assistance -Ensure renal perfusion. Goal MAP 65 or greater. -Avoid neprotoxic drugs as possible. -Strict I&O's -Follow up AM creatinine -Remove foley today.  Hypertensive emergency-POA Hx poorly controlled HTN Remain maxed on cleviprex  -SBP goal less than 160. Continue cleviprex gtt. On per tube- amlodipine 10, hydralazine '75mg'$  tid, labetalol '300mg'$  tid, Added clonidine 0.3 tid  Best practice:   Best Practice (right click and "Reselect all SmartList Selections" daily)   Diet/type: NPO w/ oral meds DVT prophylaxis: SCD GI prophylaxis: PPI Central venous access:  N/A Foley:  Yes, and it is no longer needed Code Status:  DNR Last date of multidisciplinary goals of care discussion [10/5 PC meeting- Cont DNR, treat treatable, "address need for trach/feeding tube when needed"]    Critical care time: 30 minutes     [employer]mond [employer]., MSN, APRN, AGACNP-BC [facility]  07/08/2021 , 9:24 AM  Please see [email or web address] for pager details  If no response, please call [phone] After hours, please call Elink at [phone]

## 2021-07-08 NOTE — Progress Notes (Addendum)
STROKE TEAM PROGRESS NOTE   SUBJECTIVE (INTERVAL HISTORY) No family is at the bedside. Pt still intubated on vent, off sedation. Eyes open on voice, able to follow simple commands. Na 161, will d/c 3% saline. She still needs high dose of cleviprex, will increase po BP meds. Will start tube feeding and small amount of free water.    OBJECTIVE Temp:  [98.1 F (36.7 C)-98.5 F (36.9 C)] 98.4 F (36.9 C) (10/06 0800) Pulse Rate:  [71-96] 88 (10/06 1100) Cardiac Rhythm: Normal sinus rhythm (10/06 0800) Resp:  [20-36] 22 (10/06 1100) BP: (133-167)/(76-102) 162/90 (10/06 1100) SpO2:  [91 %-96 %] 95 % (10/06 1100) FiO2 (%):  [40 %] 40 % (10/06 1121)  Recent Labs  Lab 07/07/21 1149 07/07/21 1959 07/08/21 0000 07/08/21 0306 07/08/21 0824  GLUCAP 140* 115* 152* 120* 122*   Recent Labs  Lab 07/22/2021 0549 07/28/2021 0556 08/02/2021 0717 07/08/2021 1325 07/07/21 1013 07/07/21 1558 07/07/21 2226 07/08/21 0354 07/08/21 0938  NA 138 140 142   < > 151* 159* 159* 161* 162*  K 3.6 3.6 3.2*  --  4.1  --   --  4.1  --   CL 103 105  --   --  120*  --   --  >130*  --   CO2 24  --   --   --  17*  --   --  18*  --   GLUCOSE 159* 159*  --   --  140*  --   --  111*  --   BUN 62* 68*  --   --  66*  --   --  58*  --   CREATININE 4.90* 5.00*  --   --  5.31*  --   --  5.27*  --   CALCIUM 9.3  --   --   --  9.1  --   --  9.6  --   MG  --   --   --   --   --   --   --  2.4  --   PHOS  --   --   --   --   --   --   --  4.2  --    < > = values in this interval not displayed.   Recent Labs  Lab 07/15/2021 0549  AST 124*  ALT 82*  ALKPHOS 95  BILITOT 0.5  PROT 7.5  ALBUMIN 3.7   Recent Labs  Lab 07/31/2021 0549 07/10/2021 0556 07/17/2021 0717 07/08/21 0354  WBC 11.6*  --   --  13.6*  NEUTROABS 6.8  --   --   --   HGB 9.8* 10.9* 9.9* 9.1*  HCT 30.6* 32.0* 29.0* 28.3*  MCV 88.2  --   --  91.6  PLT 198  --   --  196   No results for input(s): CKTOTAL, CKMB, CKMBINDEX, TROPONINI in the last 168  hours. Recent Labs    07/30/2021 0549  LABPROT 13.3  INR 1.0   Recent Labs    07/23/2021 0912  COLORURINE STRAW*  LABSPEC 1.010  PHURINE 7.0  GLUCOSEU 50*  HGBUR SMALL*  BILIRUBINUR NEGATIVE  KETONESUR NEGATIVE  PROTEINUR 100*  NITRITE NEGATIVE  LEUKOCYTESUR NEGATIVE       Component Value Date/Time   CHOL 157 07/08/2021 0354   TRIG 349 (H) 07/08/2021 0354   HDL 37 (L) 07/08/2021 0354   CHOLHDL 4.2 07/08/2021 0354   VLDL 70 (H) 07/08/2021 0354  LDLCALC 50 07/08/2021 0354   Lab Results  Component Value Date   HGBA1C 5.1 07/08/2021      Component Value Date/Time   LABOPIA NONE DETECTED 07/05/2021 0912   COCAINSCRNUR NONE DETECTED 07/07/2021 0912   LABBENZ NONE DETECTED 07/22/2021 0912   AMPHETMU NONE DETECTED 07/19/2021 0912   THCU NONE DETECTED 08/02/2021 0912   LABBARB NONE DETECTED 07/31/2021 0912    No results for input(s): ETH in the last 168 hours.  I have personally reviewed the radiological images below and agree with the radiology interpretations.  CT HEAD WO CONTRAST (5MM)  Result Date: 07/25/2021 CLINICAL DATA:  Stroke.  Follow-up. EXAM: CT HEAD WITHOUT CONTRAST TECHNIQUE: Contiguous axial images were obtained from the base of the skull through the vertex without intravenous contrast. COMPARISON:  Head CT earlier same day. FINDINGS: Brain: Intraparenchymal hemorrhage within the pons and left midbrain appears similar. The density is becoming less distinct. Approximate size of the hematoma remains 3.5 cm. Edema is seen throughout the pons and midbrain. Small amount of fourth ventricular blood as seen previously. No evidence of additional bleeding since the prior exam. Question if there could be slight increase in fullness of the temporal horns of the lateral ventricles that could be early evidence of hydrocephalus. Otherwise, there are mild chronic small-vessel ischemic changes of the cerebral hemispheric white matter. Vascular: No new vascular finding. Skull:  Negative Sinuses/Orbits: Clear/normal Other: None IMPRESSION: No evidence of progressive hemorrhage. Intraparenchymal hemorrhage in the pons and left midbrain with surrounding edema. Small amount of intraventricular blood in the fourth ventricle. Question slight increase in size of the temporal horns of the lateral ventricles. Electronically Signed   By: Nelson Chimes M.D.   On: 07/05/2021 15:32   CT HEAD WO CONTRAST (5MM)  Result Date: 07/04/2021 CLINICAL DATA:  55 year old female status cardiac arrest and CPR. EXAM: CT HEAD WITHOUT CONTRAST TECHNIQUE: Contiguous axial images were obtained from the base of the skull through the vertex without intravenous contrast. COMPARISON:  Head CT 01/22/2021. brain MRI 01/22/2021. FINDINGS: Brain: Relatively large and lobulated acute brainstem hemorrhage. Hyperdense blood products encompass 39 x 26 x 32 mm (AP by transverse by CC) - 16 mL centered at the mid pons and tracking both cephalad into the left midbrain and laterally toward the left cerebellar peduncle. This is distinct from a much smaller right ventral pontine hemorrhage seen in April. There is generalized pontine edema. Mildly effaced basilar cisterns. Cisterna magna remains patent. No convincing intraventricular or extra-axial extension of blood at this time. No ventriculomegaly. No superimposed acute cortically based infarct identified. There is scattered supratentorial white matter hypodensity which is chronic and stable. Vascular: Mild Calcified atherosclerosis at the skull base. No suspicious intracranial vascular hyperdensity. Skull: No acute osseous abnormality identified. Chronic bilateral lamina papyracea fractures. Sinuses/Orbits: Intubated on the scout view. Fluid in the visible pharynx. Chronic lamina papyracea fractures. Mild paranasal sinus fluid and/or mucosal thickening now. Tympanic cavities and mastoids remain clear. Other: No acute orbit or scalp soft tissue finding. Chronic right posterior  convexity scalp lipoma. IMPRESSION: 1. Primary Acute Brainstem Hemorrhage, with a relatively large 16 mL volume of hyperdense blood within the pons, tracking cephalad into the left midbrain and laterally towards the left cerebellar peduncle. Pontine edema. 2. No intraventricular or extra-axial extension. No ventriculomegaly. And basilar cisterns remain patent at this time. 3. Sequelae of small vessel disease is strongly favored, and this patient presented with a smaller right ventral pontine hemorrhage in April this year (see  Brain MRI 01/22/2021). Critical Value/emergent results were called by telephone at the time of interpretation on 07/12/2021 at 6:16 am to Dr. Shirlyn Goltz , who verbally acknowledged these results. Electronically Signed   By: Genevie Ann M.D.   On: 07/24/2021 06:20   DG Chest Port 1 View  Result Date: 07/28/2021 CLINICAL DATA:  Acute respiratory failure, COVID positive. EXAM: PORTABLE CHEST 1 VIEW COMPARISON:  July 06, 2021 (11:10 a.m.) FINDINGS: There is stable endotracheal tube and nasogastric tube positioning. Mild left suprahilar atelectasis and/or infiltrate is noted. This is mildly increased in severity when compared to the prior study. There is no evidence of a pleural effusion or pneumothorax. The cardiac silhouette is mildly enlarged. Stable prominence of the superior mediastinum is noted with irregular borders along the expected region of the aortic arch. The visualized skeletal structures are unremarkable. IMPRESSION: 1. Mild left suprahilar atelectasis and/or infiltrate, increased in severity when compared to the prior study. 2. Persistent abnormal upper mediastinal contours. Further evaluation with chest CT is recommended. Electronically Signed   By: Virgina Norfolk M.D.   On: 07/12/2021 22:39   DG CHEST PORT 1 VIEW  Result Date: 07/13/2021 CLINICAL DATA:  Line placement, ETT, COVID positive EXAM: PORTABLE CHEST 1 VIEW COMPARISON:  Chest radiograph obtained earlier the same day  FINDINGS: The endotracheal tube is in the midthoracic trachea approximately 3.3 cm from the carina. The cardiac silhouette is stable. The upper mediastinum appears widened, similar to the most recent prior study but increased compared to a more remote study from 2018. There is increased vascular congestion. There is no new focal consolidation. There is no pleural effusion. There is no pneumothorax. The bones are stable. IMPRESSION: 1. Unchanged abnormal upper mediastinal contours. CT chest is recommended for better evaluation. 2. Increased vascular congestion without overt pulmonary edema. Electronically Signed   By: Valetta Mole M.D.   On: 07/30/2021 11:39   DG Chest Port 1 View  Result Date: 07/19/2021 CLINICAL DATA:  55 year old female with brainstem hemorrhage, status post CPR. EXAM: PORTABLE CHEST 1 VIEW COMPARISON:  Chest radiographs 02/06/2017 and earlier. FINDINGS: Portable AP supine view at 0555 hours. Endotracheal tube tip in good position between the clavicles and carina. Enteric tube courses to the abdomen, tip not included. Indistinct superior mediastinal contour, a including in the aortic arch region. Cardiac size appears stable and within normal limits. No pneumothorax, pulmonary edema or consolidation evident on this supine view. There is hypo ventilation at the left lung base. Mildly gas distended stomach. No acute osseous abnormality identified. IMPRESSION: 1. Satisfactory ET tube and visible enteric tube. 2. Indistinct superior mediastinal contours, nonspecific. Unclear whether this is related to bilateral superior perihilar lung opacity (such as due to aspiration in this setting), or an abnormal mediastinum. Chest CT would best evaluate further. 3. Left lung base atelectasis suspected. Electronically Signed   By: Genevie Ann M.D.   On: 07/11/2021 07:02   [place] EKG SITE RITE  Result Date: 07/13/2021 If Site Rite image not attached, placement could not be confirmed due to current cardiac rhythm.     PHYSICAL EXAM  Temp:  [98.1 F (36.7 C)-98.5 F (36.9 C)] 98.4 F (36.9 C) (10/06 0800) Pulse Rate:  [71-96] 88 (10/06 1100) Resp:  [20-36] 22 (10/06 1100) BP: (133-167)/(76-102) 162/90 (10/06 1100) SpO2:  [91 %-96 %] 95 % (10/06 1100) FiO2 (%):  [40 %] 40 % (10/06 1121)  General - Well nourished, well developed, intubated off sedation.  Ophthalmologic - fundi not visualized  due to noncooperation.  Cardiovascular - Regular rate and rhythm.  Neuro - intubated off sedation, eyes open, able to follow simple command on the left. With eye opening, eyes in mid position, not blinking to visual threat bilaterally, able to have downward gaze bilaterally, and partial upward gaze bilaterally, however no movement horizontally, pupil right 1.5 mm and left 87m, nonreactive to light.  Corneal reflex absent bilaterally, gag and cough present. Breathing over the vent.  Facial symmetry not able to test due to ET tube.  Tongue protrusion not cooperative.  Left upper extremity proximal 0/5 but bicep 3-/5 barely against gravity, finger grip 3/5.  Left lower extremity proximal 2/5, distal toe DF/PF 3+/5.  Right UE flaccid proximally but finger grip 2/5, and right LE slight withdraw to pain stimulation, no movement of toes. DTR diminished and no babinski. Sensation, coordination not corporative and gait not tested.   ASSESSMENT/PLAN Ms. MFELISITA PANTAZISis a 55y.o. 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 No tPA given due to [employer]    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 CT head large pontine and midbrain ICH with small IVH CT repeat stable hematoma, questionable slight increase ventricular size CT repeat pending today 2D Echo EF 60 to 65% in 01/2021 LDL 50 HgbA1c 5.1 UDS negative SCDs for VTE prophylaxis No antithrombotic prior to admission, now on No  antithrombotic due to [place] recommendations: Pending Disposition: Pending   Cerebral edema CT showed focal mass-effect at the brainstem On 3% saline @ 50 ->  off Sodium 143->148->152->151->159->161 Na goal 150-155 Na monitoring On FW 100 Q6h  History of ICH 01/2021 admitted for headache and slurred speech as well as hypertensive emergency.  CT showed right small pontine ICH.  EF 60 to 65%.  LDL 69, A1c 5.3.  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 at baseline  Hypertensive emergency Unstable Still on Cleviprex maximize dose Labetalol as needed On hydralazine 75 Q8, and amlodipine 10 Increase labetalol to 300 Q8 Add clonidine 0.3 tid BP goal less than 160 Long term BP goal normotensive  Respiratory failure Possible aspirin pneumonia Intubated for airway protection  Still on ventilation CCM on board Leukocytosis WBC 11.6->13.6 On Rocephin  AKI on CKD Creatinine 3.86->5.00->5.31->5.27 CCM on board On IV fluid Nephrology on board, not candidate for dialysis yet.  COVID PCR positive Isolation CCM on board  Dysphagia Has OG tube On TF and FW Consider cortrak in am  Tobacco abuse Current smoker Smoking cessation counseling will be provided  Other Stroke Risk Factors History of THC use, UDS negative this time  Other Active Problems   Hospital day # 2  This patient is critically ill due to large brainstem bleeding, severe renal failure, hypertensive emergency, respiratory failure, hydrocephalus and at significant risk of neurological worsening, death form brain herniation, renal failure, seizure, hypertensive encephalopathy. This patient's care requires constant monitoring of vital signs, hemodynamics, respiratory and cardiac monitoring, review of multiple databases, neurological assessment, discussion with family, other specialists and medical decision making of high complexity. I spent 45 minutes of neurocritical care time in the care of this patient. I had  long discussion with son over the phone, updated pt current condition, treatment plan and potential prognosis, and answered all the questions. He expressed understanding and appreciation.    JRosalin Hawking MD PhD Stroke Neurology 07/08/2021 11:30 AM    To contact Stroke Continuity  provider, please refer to [email or web address] After hours, contact General Neurology

## 2021-07-08 NOTE — Significant Event (Signed)
Attempted to call [name] White, son of THARA BUFFUM, over the phone at [phone] twice for update on patient care. Unable to get in contact with patient contact. Will attempt clinical update again on 10/6 or PRN.  Redmond School., MSN, APRN, AGACNP-BC [facility]  07/08/2021 , 10:52 AM  Please see [email or web address] for pager details  If no response, please call [phone] After hours, please call Elink at [phone]

## 2021-07-08 NOTE — Progress Notes (Signed)
Daily Progress Note   Patient Name: Robin Navarro       Date: 07/08/2021 DOB: 1966/09/29  Age: 55 y.o. MRN#: [id number] Attending Physician: Stroke, Md, MD Primary Care Physician: [name], MD Admit Date: 07/18/2021 Length of Stay: 2 days  Reason for Consultation/Follow-up: Establishing goals of care  HPI/Patient Profile:  55 y.o. 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 EMS responded with possible PEA arrest requiring CPR in the field for several minutes and noted profound hypertension. Required King airway in the field and converted to ETT in the ER. Stat head CT with large brainstem hemorrhage (approx 16 mL). PCCM, neurology, nephrology have all been consulted.   Noted AKI on CKD, seen by nephrology who is holding on HD given her current condition. Ongoing/planned neurology and neurosurgery evaluation.  Possible aspiration pneumonitis. Remains on the vent, PCCM feels suspected terminal brain bleed but awaiting specialist input. Current plan for 6 hour repeat CT to help with prognostication.   PMT was consulted for goals of care/family discussion given severe/likely terminal condition.  Subjective:   Subjective: Chart Reviewed. Updates received. Patient Assessed. Created space and opportunity for patient  and family to explore thoughts and feelings regarding current medical situation.  Today's Discussion: No family at the patient bedside. Patient opened her eyes to voice, shaked her head "no" when asked if she's in pain. Discussed current clinical status with Brittney (RN) and Mr. Iona Beard (CCM NP). Patient clinically stable compared to yesterday. Still concern about likely edema, high risk for herniation though 3% NaCl likely helped initially resulting in current improvements.  Review of Systems  Unable to perform ROS: Intubated  Constitutional:        Shakes head "no" when asked if  in pain   Objective:   Vital Signs:  BP (!) 152/82 (BP Location: Right Arm)   Pulse 82   Temp 98.4 F (36.9 C) (Oral)   Resp (!) 25   Ht '5\' 4"'$  (1.626 m)   Wt 78 kg   LMP 12/23/2013   SpO2 95%   BMI 29.52 kg/m   Physical Exam: Physical Exam Vitals and nursing note reviewed.  Constitutional:      General: She is sleeping.  HENT:     Head: Normocephalic and atraumatic.  Cardiovascular:     Rate and Rhythm: Normal rate.  Pulmonary:     Effort: No respiratory distress.     Breath sounds: No rhonchi.  Abdominal:     General: Abdomen is flat.     Palpations: Abdomen is soft.  Skin:    General: Skin is warm and dry.  Neurological:     Mental Status: She is easily aroused.     Comments: Follows simple commands. Squeezes left hand, wiggles left toes. Continued right-sided hemiplegia    Palliative Assessment/Data: 10% (intubated, NPO)   Assessment & Plan:   Impression: Present on Admission: . Brainstem hemorrhage ([place]) . ICH (intracerebral hemorrhage) ([place])  Very ill 55 year old female who has suffered a large brainstem bleed. No hernaition at this point, but remains at high risk. Question PEA arrest in the field. Repeat CT head around showed no further bleeding, persistent hematoma, question ventricular changes. Currently has regained some critical reflexes (gag, cough) and still without pupil reaction to light; See neurology full note for details. Has had some responsiveness yesterday and again today, but still high risk for worsening and mortality. Family  aware of how sick she is. Indications that she would not want artificial prolongation of life without ability to interact or have quality. Agreeable to remain DNR. Regarding trach/feeding tube: They understand this may need to be discussed. They're not opped to these in theory, if it'll help her recovery. However, given how unstable the situation is they want to hold off on absolute decisions until needed to allow time for  clinical status to evolce/indication of tradjectory.  SUMMARY OF RECOMMENDATIONS   Continue to treat the treatable Remain DNR Address need for trach/feeding tube when necessary Continued family support Continued spiritual care PMT will continue to follow  Code Status: DNR  Prognosis: Unable to determine  Discharge Planning: To Be Determined  Discussed with: Medical team, nursing team  Thank you for allowing [place] to participate in the care of DOLLYE TRAYWICK PMT will continue to support holistically.  Time Total: 35 min  Visit consisted of counseling and education dealing with the complex and emotionally intense issues of symptom management and palliative care in the setting of serious and potentially life-threatening illness. Greater than 50%  of this time was spent counseling and coordinating care related to the above assessment and plan.  Walden Field, NP Palliative Medicine Team  Team Phone # [phone] (Nights/Weekends)  06/01/2021, 8:17 AM

## 2021-07-08 NOTE — Progress Notes (Addendum)
Initial Nutrition Assessment  DOCUMENTATION CODES:   Not applicable  INTERVENTION:   Initiate Vital AF 1.2 @ 60 ml/hr via OGT   Tube feeding regimen provides 1728 kcal (100% of needs), 108 grams of protein, and 1168 ml of H2O.    NUTRITION DIAGNOSIS:   Inadequate oral intake related to inability to eat as evidenced by NPO status.  GOAL:   Patient will meet greater than or equal to 90% of their needs  MONITOR:   Vent status, Labs, Weight trends, TF tolerance, Skin, I & O's  REASON FOR ASSESSMENT:   Ventilator, Consult Enteral/tube feeding initiation and management  ASSESSMENT:   55 year old woman w/ hx of kidney disease, HTN, previous pontine hemorrhage presenting with slurred speech that started around 4 AM.  When EMS arrived there was a question of PEA arrest and patient received CPR for several minutes however her initial SBP was 300?  Regardless, she had a king airway placed in field for coma which was exchanged for ETT when arrived to ER.  Stat head CT shows large brainstem hemorrhage.  Neurology consulted, PCCM asked to admit.  Pt admitted with large brainstem hemorrhage.   Patient is currently intubated on ventilator support. OGT currently connected to low, intermittent suction.  MV: 12.4 L/min Temp (24hrs), Avg:98.4 F (36.9 C), Min:98.1 F (36.7 C), Max:98.5 F (36.9 C)  Cleviprex: 64 ml/hr (provides 3072 kcals daily)  Reviewed I/O's: +1.2 L x 24 hours and +2.8 L since admission  UOP: 1.5 L x 24 hours  MAP: 82  Case discussed with PCCM. While pt family initially was leaning towards comfort care, pt family would like to pursue TF and DNR. RD to place TF orders.   Reviewed wt hx; pt has experienced a 10.4% wt loss over the past year, which is not significant for time frame.   Medications reviewed and include senokot.   Labs reviewed: CBGS: 161, CBGS: 120-152 (inpatient orders for glycemic control are 0-9 units insulin aspart every 4 hours).    Diet  Order:   Diet Order             Diet NPO time specified  Diet effective now                   EDUCATION NEEDS:   Not appropriate for education at this time  Skin:  Skin Assessment: Reviewed RN Assessment  Last BM:  Unknown  Height:   Ht Readings from Last 1 Encounters:  08/02/2021 '5\' 4"'$  (1.626 m)    Weight:   Wt Readings from Last 1 Encounters:  07/24/2021 78 kg    Ideal Body Weight:  54.5 kg  BMI:  Body mass index is 29.52 kg/m.  Estimated Nutritional Needs:   Kcal:  [id number]  Protein:  100-115 grams  Fluid:  > 1.6 L   Loistine Chance, RD, LDN, [place] Registered Dietitian II Certified Diabetes Care and Education Specialist Please refer to [facility] for RD and/or RD on-call/weekend/after hours pager

## 2021-07-08 NOTE — Progress Notes (Signed)
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  Currently in ICU on hypertonic saline as dosed per neurology - goal noted to be 150-155 and 161 this AM; will defer to their management re: dec dose +/- giving back free water.  BP control per PCCM/neuro.  PMT following - DNR, cont med care, address trach/PEG when time comes.   **AKI on CKD:  advanced CKD at baseline now with worsening renal function in setting of HTN emergency and head bleed.  Renal function stable with good UOP and normal electrolytes.  No current indications for dialysis.   No indications for further w/u at this time; 06/2020 renal US c/w advanced CKD.   **hypernatremia: therapeutic for increased ICP - per above, defer to neurology re: > goal.    **HTN emergency: acute treatment per PCCM. Currently acceptable.   **Anemia: Hb in 9s, no acute issue.     Will follow, call me with issues I can help with but I don't see much I can help with for this unfortunate woman.  She would be a poor dialysis candidate if she requires trach/PEG - only disposition would be LTACH if she's on dialysis.  ------------------------------------------------------------------------------------ Subjective:   Remains criticially ill on vent with significant pontine hemorrhage. Opening eyes, R hemiplegia but can move L some.  UOP 1.5L yesterday.   Objective Vitals:   07/08/21 0615 07/08/21 0630 07/08/21 0700 07/08/21 0800  BP: (!) 154/83 (!) 148/80 (!) 149/80 (!) 152/82  Pulse: 77 79 80 82  Resp: (!) 24 (!) 24 (!) 22 (!) 25  Temp:    98.4 F (36.9 C)  TempSrc:    Oral  SpO2: 94% 94% 95% 95%  Weight:      Height:       Physical Exam General: intubated, obtunded Heart: NSR on monitor Lungs: vent 40% fio2 Neuro: obtunded  Additional Objective Labs: Basic Metabolic Panel: Recent Labs  Lab 07/23/2021 0549 07/26/2021 0556 07/12/2021 0717 07/11/2021 1325  07/07/21 1013 07/07/21 1558 07/07/21 2226 07/08/21 0354  NA 138 140 142   < > 151* 159* 159* 161*  K 3.6 3.6 3.2*  --  4.1  --   --  4.1  CL 103 105  --   --  120*  --   --  >130*  CO2 24  --   --   --  17*  --   --  18*  GLUCOSE 159* 159*  --   --  140*  --   --  111*  BUN 62* 68*  --   --  66*  --   --  58*  CREATININE 4.90* 5.00*  --   --  5.31*  --   --  5.27*  CALCIUM 9.3  --   --   --  9.1  --   --  9.6  PHOS  --   --   --   --   --   --   --  4.2   < > = values in this interval not displayed.    Liver Function Tests: Recent Labs  Lab 07/16/2021 0549  AST 124*  ALT 82*  ALKPHOS 95  BILITOT 0.5  PROT 7.5  ALBUMIN 3.7    No results for input(s): LIPASE, AMYLASE in the last 168 hours. CBC: Recent Labs  Lab 07/08/2021 0549 08/02/2021 0556 07/11/2021 0717 07/08/21 0354  WBC 11.6*  --   --  13.6*  NEUTROABS 6.8  --   --   --  HGB 9.8* 10.9* 9.9* 9.1*  HCT 30.6* 32.0* 29.0* 28.3*  MCV 88.2  --   --  91.6  PLT 198  --   --  196    Blood Culture    Component Value Date/Time   SDES TRACHEAL ASPIRATE 07/07/2021 1048   SPECREQUEST NONE 07/07/2021 1048   CULT  07/07/2021 1048    CULTURE REINCUBATED FOR BETTER GROWTH Performed at [facility] [address] [place], [place] [address]    REPTSTATUS PENDING 07/07/2021 1048    Cardiac Enzymes: No results for input(s): CKTOTAL, CKMB, CKMBINDEX, TROPONINI in the last 168 hours. CBG: Recent Labs  Lab 07/07/21 1149 07/07/21 1959 07/08/21 0000 07/08/21 0306 07/08/21 0824  GLUCAP 140* 115* 152* 120* 122*    Iron Studies: No results for input(s): IRON, TIBC, TRANSFERRIN, FERRITIN in the last 72 hours. _0 @ Studies/Results: CT HEAD WO CONTRAST (5MM)  Result Date: 07/11/2021 CLINICAL DATA:  Stroke.  Follow-up. EXAM: CT HEAD WITHOUT CONTRAST TECHNIQUE: Contiguous axial images were obtained from the base of the skull through the vertex without intravenous contrast. COMPARISON:  Head CT earlier same day.  FINDINGS: Brain: Intraparenchymal hemorrhage within the pons and left midbrain appears similar. The density is becoming less distinct. Approximate size of the hematoma remains 3.5 cm. Edema is seen throughout the pons and midbrain. Small amount of fourth ventricular blood as seen previously. No evidence of additional bleeding since the prior exam. Question if there could be slight increase in fullness of the temporal horns of the lateral ventricles that could be early evidence of hydrocephalus. Otherwise, there are mild chronic small-vessel ischemic changes of the cerebral hemispheric white matter. Vascular: No new vascular finding. Skull: Negative Sinuses/Orbits: Clear/normal Other: None IMPRESSION: No evidence of progressive hemorrhage. Intraparenchymal hemorrhage in the pons and left midbrain with surrounding edema. Small amount of intraventricular blood in the fourth ventricle. Question slight increase in size of the temporal horns of the lateral ventricles. Electronically Signed   By: Nelson Chimes M.D.   On: 07/05/2021 15:32   DG Chest Port 1 View  Result Date: 07/16/2021 CLINICAL DATA:  Acute respiratory failure, COVID positive. EXAM: PORTABLE CHEST 1 VIEW COMPARISON:  July 06, 2021 (11:10 a.m.) FINDINGS: There is stable endotracheal tube and nasogastric tube positioning. Mild left suprahilar atelectasis and/or infiltrate is noted. This is mildly increased in severity when compared to the prior study. There is no evidence of a pleural effusion or pneumothorax. The cardiac silhouette is mildly enlarged. Stable prominence of the superior mediastinum is noted with irregular borders along the expected region of the aortic arch. The visualized skeletal structures are unremarkable. IMPRESSION: 1. Mild left suprahilar atelectasis and/or infiltrate, increased in severity when compared to the prior study. 2. Persistent abnormal upper mediastinal contours. Further evaluation with chest CT is recommended.  Electronically Signed   By: Virgina Norfolk M.D.   On: 07/16/2021 22:39   DG CHEST PORT 1 VIEW  Result Date: 07/03/2021 CLINICAL DATA:  Line placement, ETT, COVID positive EXAM: PORTABLE CHEST 1 VIEW COMPARISON:  Chest radiograph obtained earlier the same day FINDINGS: The endotracheal tube is in the midthoracic trachea approximately 3.3 cm from the carina. The cardiac silhouette is stable. The upper mediastinum appears widened, similar to the most recent prior study but increased compared to a more remote study from 2018. There is increased vascular congestion. There is no new focal consolidation. There is no pleural effusion. There is no pneumothorax. The bones are stable. IMPRESSION: 1. Unchanged abnormal upper  mediastinal contours. CT chest is recommended for better evaluation. 2. Increased vascular congestion without overt pulmonary edema. Electronically Signed   By: Valetta Mole M.D.   On: 07/09/2021 11:39   [place] EKG SITE RITE  Result Date: 07/05/2021 If Site Rite image not attached, placement could not be confirmed due to current cardiac rhythm.  Medications:  ampicillin-sulbactam (UNASYN) IV     clevidipine 32 mg/hr (07/08/21 0800)   sodium chloride (hypertonic) Stopped (07/08/21 0441)    amLODipine  10 mg Per Tube Daily   chlorhexidine gluconate (MEDLINE KIT)  15 mL Mouth Rinse BID   Chlorhexidine Gluconate Cloth  6 each Topical Daily   cloNIDine  0.3 mg Per Tube TID   hydrALAZINE  75 mg Per Tube TID   insulin aspart  0-9 Units Subcutaneous Q4H   labetalol  300 mg Per Tube TID   mouth rinse  15 mL Mouth Rinse 10 times per day   pantoprazole (PROTONIX) IV  40 mg Intravenous BID   senna-docusate  1 tablet Per Tube BID     Jannifer Hick MD 07/08/2021, 10:38 AM  [facility] Pager: [phone]

## 2021-07-09 LAB — CULTURE, RESPIRATORY W GRAM STAIN: Culture: NORMAL

## 2021-07-09 LAB — CBC
HCT: 24.5 % — ABNORMAL LOW (ref 36.0–46.0)
Hemoglobin: 7.6 g/dL — ABNORMAL LOW (ref 12.0–15.0)
MCH: 28.4 pg (ref 26.0–34.0)
MCHC: 31 g/dL (ref 30.0–36.0)
MCV: 91.4 fL (ref 80.0–100.0)
Platelets: 166 10*3/uL (ref 150–400)
RBC: 2.68 MIL/uL — ABNORMAL LOW (ref 3.87–5.11)
RDW: 19.4 % — ABNORMAL HIGH (ref 11.5–15.5)
WBC: 11.6 10*3/uL — ABNORMAL HIGH (ref 4.0–10.5)
nRBC: 0.9 % — ABNORMAL HIGH (ref 0.0–0.2)

## 2021-07-09 LAB — BASIC METABOLIC PANEL
BUN: 60 mg/dL — ABNORMAL HIGH (ref 6–20)
CO2: 23 mmol/L (ref 22–32)
Calcium: 9.1 mg/dL (ref 8.9–10.3)
Chloride: >130 mmol/L (ref 98–111)
Creatinine, Ser: 5.58 mg/dL — ABNORMAL HIGH (ref 0.44–1.00)
GFR, Estimated: 9 mL/min — ABNORMAL LOW (ref >60)
Glucose, Bld: 99 mg/dL (ref 70–99)
Potassium: 4 mmol/L (ref 3.5–5.1)
Sodium: 163 mmol/L (ref 135–145)

## 2021-07-09 LAB — GLUCOSE, CAPILLARY
Glucose-Capillary: 103 mg/dL — ABNORMAL HIGH (ref 70–99)
Glucose-Capillary: 114 mg/dL — ABNORMAL HIGH (ref 70–99)
Glucose-Capillary: 114 mg/dL — ABNORMAL HIGH (ref 70–99)
Glucose-Capillary: 123 mg/dL — ABNORMAL HIGH (ref 70–99)
Glucose-Capillary: 124 mg/dL — ABNORMAL HIGH (ref 70–99)
Glucose-Capillary: 91 mg/dL (ref 70–99)

## 2021-07-09 LAB — PHOSPHORUS
Phosphorus: 6.4 mg/dL — ABNORMAL HIGH (ref 2.5–4.6)
Phosphorus: 4.8 mg/dL — ABNORMAL HIGH (ref 2.5–4.6)

## 2021-07-09 LAB — MAGNESIUM
Magnesium: 2.2 mg/dL (ref 1.7–2.4)
Magnesium: 2.3 mg/dL (ref 1.7–2.4)

## 2021-07-09 LAB — SODIUM
Sodium: 162 mmol/L (ref 135–145)
Sodium: 164 mmol/L (ref 135–145)
Sodium: 163 mmol/L (ref 135–145)

## 2021-07-09 MED ORDER — SODIUM CHLORIDE 0.45 % IV SOLN: INTRAVENOUS | Status: AC

## 2021-07-09 NOTE — Progress Notes (Signed)
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  Currently in ICU on hypertonic saline as dosed per neurology - goal noted to be 150-155 and 161 this AM; will defer to their management re: dec dose +/- giving back free water.  BP control per PCCM/neuro.  PMT following - DNR, cont med care, address trach/PEG when time comes.   **AKI on CKD:  advanced CKD at baseline now with worse renal function in setting of HTN emergency and head bleed.  Foley UA with small blood - nothing unexpected.  Currently renal function stable with good UOP and normal electrolytes (except intentional hypernatremia) and acid/base.  No current indications for dialysis but certainly not much reserve here.  Overall functional status will help guide dialysis plans as it will affect potential disposition.  Will follow closely to help navigate this.   Following I/Os, dose meds for CrCl < 15.    **hypernatremia: therapeutic for increased ICP - per above, defer to neurology re: > goal.     **HTN emergency: acute treatment per PCCM. Currently acceptable.   **Anemia: Hb in 7.6; check ferritin and iron.  Transfuse < 7.  **incidentally COVID +  Will follow, call with questions.  No family bedside but I'll plan to update them on kidney function this weekend.    ------------------------------------------------------------------------------------ Subjective:   Remains criticially ill on vent with significant pontine hemorrhage but now following commands on L have having some improvement.  UOP 875m yesterday.  Having some bradycardic episodes 1 of which led to brief PEA arrest with 27m CPR.    Objective Vitals:   07/09/21 1100 07/09/21 1115 07/09/21 1200 07/09/21 1300  BP: (!) 150/87  (!) 142/78 139/77  Pulse: 76  67 70  Resp: (!) 29  (!) 28 (!) 26  Temp:      TempSrc:      SpO2: 96% 96% 96% 96%  Weight:      Height:       Physical  Exam General: intubated Heart: NSR on monitor Lungs: vent 40% fio2 Neuro: arouses to voice, can move L on command  Additional Objective Labs: Basic Metabolic Panel: Recent Labs  Lab 07/08/21 0354 07/08/21 0938 07/08/21 1148 07/08/21 1742 07/08/21 1825 07/08/21 2216 07/09/21 0522 07/09/21 0956  NA 161*   < >  --  163* 162* 164* 163* 162*  K 4.1  --   --   --  3.8  --  4.0  --   CL >130*  --   --   --  >130*  --  >130*  --   CO2 18*  --   --   --  21*  --  23  --   GLUCOSE 111*  --   --   --  158*  --  99  --   BUN 58*  --   --   --  57*  --  60*  --   CREATININE 5.27*  --   --   --  5.54*  --  5.58*  --   CALCIUM 9.6  --   --   --  9.3  --  9.1  --   PHOS 4.2  --  4.4 6.0*  --   --  6.4*  --    < > = values in this interval not displayed.    Liver Function Tests: Recent Labs  Lab 08/02/2021 0549 07/08/21 1825  AST 124* 43*  ALT 82* 48*  ALKPHOS 95 57  BILITOT 0.5 0.4  PROT 7.5 5.9*  ALBUMIN 3.7 2.5*    No results for input(s): LIPASE, AMYLASE in the last 168 hours. CBC: Recent Labs  Lab 07/25/2021 0549 08/01/2021 0556 07/08/21 0354 07/08/21 1825 07/09/21 0522  WBC 11.6*  --  13.6* 17.3* 11.6*  NEUTROABS 6.8  --   --  11.9*  --   HGB 9.8*   < > 9.1* 7.9* 7.6*  HCT 30.6*   < > 28.3* 26.0* 24.5*  MCV 88.2  --  91.6 91.9 91.4  PLT 198  --  196 183 166   < > = values in this interval not displayed.    Blood Culture    Component Value Date/Time   SDES TRACHEAL ASPIRATE 07/07/2021 1048   SPECREQUEST NONE 07/07/2021 1048   CULT  07/07/2021 1048    Normal respiratory flora-no Staph aureus or Pseudomonas seen Performed at [facility], [place] [address] [place], [place] [address]    REPTSTATUS 07/09/2021 FINAL 07/07/2021 1048    Cardiac Enzymes: No results for input(s): CKTOTAL, CKMB, CKMBINDEX, TROPONINI in the last 168 hours. CBG: Recent Labs  Lab 07/08/21 2006 07/08/21 2354 07/09/21 0346 07/09/21 0811 07/09/21 1228  GLUCAP 115* 123* 91 103*  114*    Iron Studies: No results for input(s): IRON, TIBC, TRANSFERRIN, FERRITIN in the last 72 hours. @lablastinr3 @ Studies/Results: No results found. Medications:  ampicillin-sulbactam (UNASYN) IV Stopped (07/09/21 1050)   clevidipine Stopped (07/08/21 1338)   feeding supplement (VITAL AF 1.2 CAL) Stopped (07/08/21 1815)    amLODipine  10 mg Per Tube Daily   chlorhexidine gluconate (MEDLINE KIT)  15 mL Mouth Rinse BID   Chlorhexidine Gluconate Cloth  6 each Topical Daily   free water  100 mL Per Tube Q6H   hydrALAZINE  75 mg Per Tube TID   insulin aspart  0-9 Units Subcutaneous Q4H   mouth rinse  15 mL Mouth Rinse 10 times per day   pantoprazole (PROTONIX) IV  40 mg Intravenous BID   senna-docusate  1 tablet Per Tube BID     Jannifer Hick MD 07/09/2021, 2:05 PM  [facility] Pager: [phone]

## 2021-07-09 NOTE — Progress Notes (Signed)
STROKE TEAM PROGRESS NOTE   INTERVAL HISTORY Her son is at the bedside.  She remains intubated for respiratory failure but not sedated and is arousable and follows simple commands.  Blood pressure adequately controlled.  Serum sodium remains high at 163.  Hypertonic saline has been stopped for few days.  She has been started on half-normal saline. She had episode of cardiac arrest due to asystole yesterday and was resuscitated after 2 rounds of CPR with epinephrine x1.  He regained consciousness and is following commands.  She continues to have some transient intermittent bradycardia. Vitals:   07/09/21 0700 07/09/21 0800 07/09/21 0805 07/09/21 0900  BP: (!) 144/84 (!) 152/85  (!) 146/78  Pulse: 69 69  63  Resp: (!) 23 (!) 22  20  Temp:  (!) 101.7 F (38.7 C)    TempSrc:  Axillary    SpO2: 97% 97% 96% 96%  Weight:      Height:       CBC:  Recent Labs  Lab 07/19/2021 0549 07/30/2021 0556 07/08/21 1825 07/09/21 0522  WBC 11.6*   < > 17.3* 11.6*  NEUTROABS 6.8  --  11.9*  --   HGB 9.8*   < > 7.9* 7.6*  HCT 30.6*   < > 26.0* 24.5*  MCV 88.2   < > 91.9 91.4  PLT 198   < > 183 166   < > = values in this interval not displayed.   Basic Metabolic Panel:  Recent Labs  Lab 07/08/21 1742 07/08/21 1825 07/08/21 2216 07/09/21 0522  NA 163* 162* 164* 163*  K  --  3.8  --  4.0  CL  --  >130*  --  >130*  CO2  --  21*  --  23  GLUCOSE  --  158*  --  99  BUN  --  57*  --  60*  CREATININE  --  5.54*  --  5.58*  CALCIUM  --  9.3  --  9.1  MG 2.3 2.3  --  2.2  PHOS 6.0*  --   --  6.4*   Lipid Panel:  Recent Labs  Lab 07/08/21 0354  CHOL 157  TRIG 349*  HDL 37*  CHOLHDL 4.2  VLDL 70*  LDLCALC 50   HgbA1c:  Recent Labs  Lab 07/08/21 0354  HGBA1C 5.1   Urine Drug Screen:  Recent Labs  Lab 07/05/2021 0912  LABOPIA NONE DETECTED  COCAINSCRNUR NONE DETECTED  LABBENZ NONE DETECTED  AMPHETMU NONE DETECTED  THCU NONE DETECTED  LABBARB NONE DETECTED    Alcohol Level No  results for input(s): ETH in the last 168 hours.  IMAGING past 24 hours No results found.  PHYSICAL EXAM [name] is intubated. General - Well nourished, well developed, intubated off sedation.   Ophthalmologic - fundi not visualized due to noncooperation.   Cardiovascular - Regular rate and rhythm.   Neuro - intubated off sedation, eyes open, able to follow simple command on the left. With eye opening, eyes in mid position, not blinking to visual threat bilaterally, able to have downward gaze bilaterally, and partial upward gaze bilaterally, however no movement horizontally, pupil right 1.5 mm and left 1m, nonreactive to light.  Corneal reflex absent bilaterally, gag and cough present. Breathing over the vent.  Facial symmetry not able to test due to ET tube.  Tongue protrusion not cooperative.  Left upper extremity proximal 0/5 but bicep 3-/5 barely against gravity, finger grip 3/5.  Left lower  extremity proximal 2/5, distal toe DF/PF 3+/5.  Right UE flaccid proximally but finger grip 2/5, and right LE slight withdraw to pain stimulation, no movement of toes. DTR diminished and no babinski. Sensation, coordination not corporative and gait not tested.     ASSESSMENT/PLAN Ms. Robin Navarro is a 55 y.o. 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 No tPA given due to [place].     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 CT head large pontine and midbrain ICH with small IVH CT repeat stable hematoma, questionable slight increase ventricular size CT repeat pending today due to patient's hemodynamic instability 2D Echo EF 60 to 65% in 01/2021 LDL 50 HgbA1c 5.1 UDS negative SCDs for VTE prophylaxis No antithrombotic prior to admission, now on No antithrombotic due to [place] Therapy  recommendations: Pending Disposition: Pending    Cerebral edema CT showed focal mass-effect at the brainstem On 3% saline @ 50 ->  off Sodium 143->148->152->151->159->161 Na goal 150-155 Na monitoring On FW 100 Q6h   History of ICH 01/2021 admitted for headache and slurred speech as well as hypertensive emergency.  CT showed right small pontine ICH.  EF 60 to 65%.  LDL 69, A1c 5.3.  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 at baseline   Hypertensive emergency Unstable Still on Cleviprex maximize dose Labetalol as needed On hydralazine 75 Q8, and amlodipine 10 Increase labetalol to 300 Q8 Add clonidine 0.3 tid BP goal less than 160 Long term BP goal normotensive   Respiratory failure Possible aspirin pneumonia Intubated for airway protection  Still on ventilation CCM on board Leukocytosis WBC 11.6->13.6 On Rocephin   AKI on CKD Creatinine 3.86->5.00->5.31->5.27 CCM on board On IV fluid Nephrology on board, not candidate for dialysis yet.   COVID PCR positive Isolation CCM on board   Dysphagia Has OG tube On TF and FW Consider cortrak in am   Tobacco abuse Current smoker Smoking cessation counseling will be provided   Other Stroke Risk Factors History of THC use, UDS negative this time   Other Active Problems     Hospital day # 2 Patient had episode of transient or systolic cardiac arrest with was revived.  She continues to have some intermittent bradycardia likely due to brainstem autonomic dysfunction related to her hemorrhage.  Long discussion with the son at the bedside and family clearly wants a full code at the present time but with arrival of patient's mother and sister in the next few days they are willing to meet with palliative care to discuss goals of care.  Recommend atropine at bedside and possibly temporary pacing in case she has recurrent episodes of asystole.  Discussed with Dr. Tacy Learn critical care medicine and patient's son. This patient is  critically ill due to large brainstem bleeding, severe renal failure, hypertensive emergency, respiratory failure, hydrocephalus and at significant risk of neurological worsening, death form brain herniation, renal failure, seizure, hypertensive encephalopathy. This patient's care requires constant monitoring of vital signs, hemodynamics, respiratory and cardiac monitoring, review of multiple databases, neurological assessment, discussion with family, other specialists and medical decision making of high complexity. I spent 40 minutes of neurocritical care time in the care of this patient. I had long discussion with son over the phone, updated pt current condition, treatment plan and potential prognosis, and answered all the questions. He expressed understanding and appreciation.  Antony Contras, MD Stroke Neurology 07/08/2021  To contact Stroke Continuity provider, please refer to [email or web address] After hours, contact General Neurology

## 2021-07-09 NOTE — Progress Notes (Signed)
This chaplain responded to the RN's page for presence and prayer with the family at the Pt. bedside.  The chaplain is appreciative of PMT and  Pt. RN-Jenn's partnership during the family's praise and worship.  This chaplain is available for F/U spiritual care as needed.  Chaplain Sallyanne Kuster [phone]

## 2021-07-09 NOTE — Progress Notes (Signed)
   07/09/21 0852  Clinical Encounter Type  Visited With Other (Comment)  Visit Type Spiritual support  Referral From Nurse  Consult/Referral To Chaplain   This chaplain received call from attending nurse stating the family is requesting prayer at 74 noon. Referral forwarded to Palliative Chaplain Sallyanne Kuster to follow up.This note was prepared by Jeanine Luz, M.Div..  For questions please contact by phone [phone].

## 2021-07-09 NOTE — Progress Notes (Signed)
[facility] Progress Note Patient Name: Robin Navarro DOB: 1965/11/04 MRN: [id number]   Date of Service  07/09/2021  HPI/Events of Note  Sinus bradycardia - HR = 48. BP = 127/78.  eICU Interventions  Plan: D/C Labetalol IV and PO. D/C Catapres. Continue to trend HR.     Intervention Category Major Interventions: Arrhythmia - evaluation and management  Marion Seese [place] 07/09/2021, 12:16 AM

## 2021-07-09 NOTE — Progress Notes (Addendum)
Date and time results received: 07/09/21 1915  Test: Na Critical Value: 164  Name of Provider Notified: E-Link Dr. Oletta Darter Orders Received: Restart 0.45 NaCl IV infusion at 27m/hr x10hours   SWyn Quaker RN

## 2021-07-09 NOTE — Progress Notes (Signed)
While in room doing vent check/assessing pt, noted [facility] w/ rhonchi.  Suctioned pt ETT. Initially HR remained stable, 70's.  Then I orally suctioned pt.  During oral suction, noted sudden bradycardic event= HR dropped to 30's.  I immediately notified nursing staff for help.  While staff was coming into room, HR began to return to 70's.

## 2021-07-09 NOTE — Progress Notes (Signed)
Date and time results received: 07/09/21 1143 (use smartphrase ".now" to insert current time)  Test: Na Critical Value: 162  Name of Provider Notified: Dr. Tacy Learn  Orders Received? Or Actions Taken?: "stay the course"

## 2021-07-09 NOTE — Progress Notes (Signed)
NAME:  Robin Navarro, MRN:  [id number], DOB:  10/06/1965, LOS: 3 ADMISSION DATE:  07/07/2021, CONSULTATION DATE:  07/13/2021 REFERRING MD:  Robin Navarro, CHIEF COMPLAINT:  Slurred speech   Brief History   Large brainstem hemorrhage  History of present illness   55 year old woman w/ hx of kidney disease, HTN, previous pontine hemorrhage presenting with slurred speech that started around 4 AM.  When EMS arrived there was a question of PEA arrest and patient received CPR for several minutes however her initial SBP was 300?Marland Kitchen  King airway placed in field for coma which was exchanged for ETT when arrived to ER.  Stat head CT shows large brainstem hemorrhage.    Neurology admitted, PCCM consulted for vent management.   She remains critically ill.  Past Medical History  Anemia CKD HTN  Significant Hospital Events   10/4 admitted, CVC placed, removed due to malposition, made DNR by palliative care 10/5 Palliative care [place] meeting- Cont DNR, treat treatable, "address need for trach/feeding tube when needed" 10/6 PM Brady->Asystole. ROSC after 2 rounds of CPR 1 epi. Full code per patient. See Robin Navarro 10/6 1826 note.  Consults:  Neuro NSGY  Procedures:  N/a  Interim history/subjective:  10/6 PM Brady->Asystole. ROSC after 2 rounds of CPR 1 epi. Neuro exam returned to baseline  Off cleviprex  1/2 NS started overnight for NA 164  Intubated  +3.8L admission, 860 UOP, +984 ml  Unable to obtain subjective evaluation due to patient status   Objective   Blood pressure (!) 150/87, pulse 76, temperature (!) 101.7 F (38.7 C), temperature source Axillary, resp. rate (!) 29, height '5\' 4"'$  (1.626 m), weight 78 kg, last menstrual period 12/23/2013, SpO2 96 %.    Vent Mode: PRVC FiO2 (%):  [40 %-60 %] 40 % Set Rate:  [18 bmp] 18 bmp Vt Set:  [430 mL] 430 mL PEEP:  [5 cmH20] 5 cmH20 Plateau Pressure:  [19 cmH20-25 cmH20] 19 cmH20   Intake/Output Summary (Last 24 hours) at 07/09/2021 1130 Last data  filed at 07/09/2021 1100 Gross per 24 hour  Intake 2082.02 ml  Output 600 ml  Net 1482.02 ml   Filed Weights   07/03/2021 0700  Weight: 78 kg    Examination: General: In bed, NAD, appears comfortable HEENT: MM pink/moist, anicteric, atraumatic Neuro: RASS 0, PERRL 40m, GCS 10 T opens eyes to voice CV: S1S2, SB, no m/r/g appreciated PULM:  rhonchi  in the upper lobes, rhonchi  in the lower lobes, trachea midline, chest expansion symmetric GI: soft, bsx4 active, non-tender   Extremities: warm/dry, no pretibial edema, capillary refill less than 3 seconds  Skin: no rashes or lesions noted   Resolved Hospital Problem list   Elevated Troponin  Assessment & Plan:   Pontine and left midbrain ICH Small IVH Cerebral edema HX ICH Acute encephalopathy due to hemorrhagic stroke Iatrogenic hypernatremia Recurrent likely hypertensive brainstem hemorrhage.CT with edema at brainstem.  Continued GCS 10T. Suspect that neuro exam will worsen in coming days due due to injury. 10/5 [name], treat treatable, "address need for trach/feeding tube when needed". A1C 5.1. NA 159>161>162>163>162>164>163. Arrested on 10/6 PM Brady->Asystole. ROSC after 2 rounds of CPR 1 epi. Neuro exam unchanged. 10/6 Full code per patient. See XErlinda Hong10/6 1826 note. -Neurology primary, management and workup per neurology -Continue ICU care -Frequent neuro checks. -Hypertonic stopped, Stopped 1/2 ns. Allow for NA to drift down on own. NA Goal 150-155. Free h2o per neurology. -Q4H  NA check -Palliative care following appreciate assistance.  -SBP goal normotension. Labatelol and catapres stopped due to bradycardia. Continue hydralazine and amlodipine. Off cleviprex -no antiplatelet medications or anticoagulation -Repeat head imaging per neurology. CT head on pause due to hemodynamic instability.  -SCD for VTE ppx.  In hospital asystole arrest- 10/6 OCHA Arrested on 10/6 PM Brady->Asystole. ROSC  after 2 rounds of CPR 1 epi. Neuro exam unchanged. Neuro feels this is secondary to damage to brainstem. Does not qualify for TTM secondary to bleed. Lactate 1.0. Slight hgb drop. Feel that this is neurologically mediated. Mg 2.2, K 4.0 -Goal normothermia -Continue supportive care -PRN tylenol for fever -Atropine at the bedside.  Acute respiratory failure secondary to pontine ICH Aspiration pneumonia secondary to [name], on minimal vent settings, believe COVID+ incidentally. No increase in oxygenation needs. WBC no 13.6>17.3>11.6, Tmax 101.7. TA with normal flora. CXR with L infiltrate -LTVV strategy with tidal volumes of 4-8 cc/kg ideal body weight -Goal plateau pressures less than 30 and driving pressures less than 15 -Wean PEEP/FiO2 for SpO2 92-98% -VAP bundle -Daily SAT and SBT -RASS goal 0 to -1 -Follow intermittent CXR and ABG PRN -Continue Unasyn for five days total -Will attempt to extubate today if patient tolerates minimals  Anemia of chronic disease, normocytic HGB 9.8>9.1>7.6. ?coffee ground emesis in NG tube on 10/6. Feeding started -Transfuse PRBC if HBG less than 7 -Obtain AM CBC to trend H&H -Monitor for signs of bleeding -Continue protonix bid  AKI on CKD4 Nephrology consulted, 9/21 renal US with advanced CKD, complicated by HTN emergency. 1.5L uop past 24 [place] -nephrology signed off -Ensure renal perfusion. Goal MAP 65 or greater. -Avoid neprotoxic drugs as possible. -Strict I&O's -Follow up AM creatinine  Hypertensive emergency-POA- Resolved Hx poorly controlled HTN Off cleviprex -SBP goal less than 160. Labatelol and catapres stopped due to bradycardia. Continue hydralazine and amlodipine.   Best practice:   Best Practice (right click and "Reselect all SmartList Selections" daily)   Diet/type: NPO w/ oral meds DVT prophylaxis: SCD GI prophylaxis: PPI Central venous access:   N/A Foley:  N/A Code Status:  full code Last date of multidisciplinary goals of care discussion [Full code per patient. See Robin Navarro 10/6 1826 note.]   Critical care time: 35 minutes     Robin School., MSN, APRN, AGACNP-BC [facility]  07/09/2021 , 11:30 AM  Please see [email or web address] for pager details  If no response, please call [phone] After hours, please call Elink at [phone]

## 2021-07-09 NOTE — Progress Notes (Signed)
[facility] Progress Note Patient Name: Robin Navarro DOB: [birth year] MRN: [id number]   Date of Service  07/09/2021  HPI/Events of Note  Hypernatremia - Na+ = 162 --> 164.  eICU Interventions  Plan: Restart 0.45 NaCl IV infusion at 50 mL/hour X 10 hours.     Intervention Category Major Interventions: Electrolyte abnormality - evaluation and management  Rhayne Chatwin [place] 07/09/2021, 7:34 PM

## 2021-07-09 NOTE — Progress Notes (Signed)
Daily Progress Note   Patient Name: Robin Navarro       Date: 07/09/2021 DOB: March 03, 1966  Age: 55 y.o. MRN#: [id number] Attending Physician: Stroke, Md, MD Primary Care Physician: Robin Pepper, MD Admit Date: 07/10/2021 Length of Stay: 3 days  Reason for Consultation/Follow-up: Establishing goals of care  HPI/Patient Profile:  55 y.o. female  with past medical history of htn, CKD, previous small pontine bleed 01/2021 admitted on 07/24/2021 with slurred speech since 4:00 am. EMS responded with possible PEA arrest requiring CPR in the Navarro for several minutes and noted profound hypertension. Required King airway in the Navarro and converted to ETT in the ER. Stat head CT with large brainstem hemorrhage (approx 16 mL). PCCM, neurology, nephrology have all been consulted.   Noted AKI on CKD, seen by nephrology who is holding on HD given her current condition. Ongoing/planned neurology and neurosurgery evaluation.  Possible aspiration pneumonitis. Remains on the vent, PCCM feels suspected terminal brain bleed but awaiting specialist input. Current plan for 6 hour repeat CT to help with prognostication.   PMT was consulted for goals of care/family discussion given severe/likely terminal condition.  Subjective:   Subjective: Chart Reviewed. Updates received. Patient Assessed. Created space and opportunity for patient  and family to explore thoughts and feelings regarding current medical situation.  Today's Discussion: I met with the patient's son Robin Navarro at the bedside.  We discussed the events of the past 12 to 24 hours including code and rescinding of the DNR.  I expressed that I am happy that she is interacting with them and they are able to communicate with her, however I reemphasized my concern that she is still seriously ill and may not survive hospitalization.  He seems to understand the gravity of the situation but is taking "hour by hour".  He spent the night last night and states that he had  some time where he was talking to her, he played some music that she bounced her foot to.  He seems quite appreciative of this time.  We discussed current situation with medications, which she is still on and the reasons that certain medications have been stopped.  For example, 3% saline stopped due to serum sodium now significantly elevated.  We also discussed that frequent episodes of bradycardia seem to point to worsening neurological condition.  There is a hope for CT, but unsure if she is stable enough to go.  Provided emotional support with therapeutic communication.  Answered all questions, addressed all concerns.  When I was leaving the unit, multiple other family members were arriving for a prior session with the chaplain.  Review of Systems  Unable to perform ROS: Intubated   Objective:   Vital Signs:  BP (!) 149/87   Pulse 64   Temp (!) 101.7 F (38.7 C) (Axillary)   Resp 20   Ht _0  (1.626 m)   Wt 78 kg   LMP 12/23/2013   SpO2 96%   BMI 29.52 kg/m   Physical Exam: Physical Exam Vitals and nursing note reviewed.  Constitutional:      General: She is sleeping. She is not in acute distress.    Appearance: She is ill-appearing.  HENT:     Head: Normocephalic and atraumatic.  Cardiovascular:     Rate and Rhythm: Normal rate.  Pulmonary:     Effort: No respiratory distress.  Abdominal:     General: Abdomen is flat.     Palpations: Abdomen is soft.  Skin:  General: Skin is warm and dry.  Neurological:     Mental Status: She is easily aroused.    Palliative Assessment/Data: 10%   Assessment & Plan:   Impression: Present on Admission: . Brainstem hemorrhage ([place]) . ICH (intracerebral hemorrhage) ([place])  Very ill 55 year old female who has suffered a large brainstem bleed. No hernaition at this point, but remains at high risk. Question PEA arrest in the Navarro. Repeat CT head around showed no further bleeding, persistent hematoma, question ventricular changes.  Currently has regained some critical reflexes (gag, cough) and still without pupil reaction to light; See neurology full note for details. Has had some responsiveness yesterday and again today, but still high risk for worsening and mortality. Family aware of how sick she is. Indications that she would not want artificial prolongation of life without ability to interact or have quality. Agreeable to remain DNR. Regarding trach/feeding tube: They understand this may need to be discussed. They're not opped to these in theory, if it'll help her recovery. However, given how unstable the situation is they want to hold off on absolute decisions until needed to allow time for clinical status to evolce/indication of tradjectory.  SUMMARY OF RECOMMENDATIONS   Continue full code based on family wishes Continue to treat the treatable Address need for trach/feeding tube when necessary Continued family support Continued spiritual care PMT will continue to follow  Code Status: Full code  Prognosis: Unable to determine  Discharge Planning: To Be Determined  Discussed with: Medical Team, nursing team, patient's family, Spiritual Care Chaplain  Thank you for allowing [place] to participate in the care of Robin Navarro PMT will continue to support holistically.  Time Total: 75 min  Visit consisted of counseling and education dealing with the complex and emotionally intense issues of symptom management and palliative care in the setting of serious and potentially life-threatening illness. Greater than 50%  of this time was spent counseling and coordinating care related to the above assessment and plan.  Robin Field, NP Palliative Medicine Team  Team Phone # [phone] (Nights/Weekends)  06/01/2021, 8:17 AM

## 2021-07-09 NOTE — Progress Notes (Signed)
Patients Sodium levels have been slowly creeping up despite 3% being turned off last night. Neurology and CCM made aware of critical values. Decision ultimately made by CCM to start patient on .45 NS and recheck sodium in 4 hours. Stroke team is on board with CCM decision. Patient has also been bradycardic in the 40s and 50s all shift. Dr. Oletta Darter aware.

## 2021-07-10 ENCOUNTER — Inpatient Hospital Stay (HOSPITAL_COMMUNITY): Payer: Medicaid Other

## 2021-07-10 LAB — GLUCOSE, CAPILLARY
Glucose-Capillary: 109 mg/dL — ABNORMAL HIGH (ref 70–99)
Glucose-Capillary: 108 mg/dL — ABNORMAL HIGH (ref 70–99)
Glucose-Capillary: 124 mg/dL — ABNORMAL HIGH (ref 70–99)
Glucose-Capillary: 146 mg/dL — ABNORMAL HIGH (ref 70–99)
Glucose-Capillary: 137 mg/dL — ABNORMAL HIGH (ref 70–99)

## 2021-07-10 LAB — CBC
HCT: 25.2 % — ABNORMAL LOW (ref 36.0–46.0)
Hemoglobin: 7.5 g/dL — ABNORMAL LOW (ref 12.0–15.0)
MCH: 27.7 pg (ref 26.0–34.0)
MCHC: 29.8 g/dL — ABNORMAL LOW (ref 30.0–36.0)
MCV: 93 fL (ref 80.0–100.0)
Platelets: 156 10*3/uL (ref 150–400)
RBC: 2.71 MIL/uL — ABNORMAL LOW (ref 3.87–5.11)
RDW: 19.3 % — ABNORMAL HIGH (ref 11.5–15.5)
WBC: 11.4 10*3/uL — ABNORMAL HIGH (ref 4.0–10.5)
nRBC: 1.2 % — ABNORMAL HIGH (ref 0.0–0.2)

## 2021-07-10 LAB — BASIC METABOLIC PANEL
BUN: 68 mg/dL — ABNORMAL HIGH (ref 6–20)
CO2: 21 mmol/L — ABNORMAL LOW (ref 22–32)
Calcium: 8.7 mg/dL — ABNORMAL LOW (ref 8.9–10.3)
Chloride: >130 mmol/L (ref 98–111)
Creatinine, Ser: 5.99 mg/dL — ABNORMAL HIGH (ref 0.44–1.00)
GFR, Estimated: 8 mL/min — ABNORMAL LOW (ref >60)
Glucose, Bld: 110 mg/dL — ABNORMAL HIGH (ref 70–99)
Potassium: 3.8 mmol/L (ref 3.5–5.1)
Sodium: 161 mmol/L (ref 135–145)

## 2021-07-10 LAB — SODIUM
Sodium: 160 mmol/L — ABNORMAL HIGH (ref 135–145)
Sodium: 162 mmol/L (ref 135–145)

## 2021-07-10 LAB — IRON AND TIBC
Iron: 10 ug/dL — ABNORMAL LOW (ref 28–170)
Saturation Ratios: 6 % — ABNORMAL LOW (ref 10.4–31.8)
TIBC: 181 ug/dL — ABNORMAL LOW (ref 250–450)
UIBC: 171 ug/dL

## 2021-07-10 LAB — FERRITIN: Ferritin: 294 ng/mL (ref 11–307)

## 2021-07-10 IMAGING — CT CT HEAD W/O CM
3 of 4 series · 15 of 47 positions shown, 18 images · non-contrast
Comparison: Prior head CT examinations [DATE] and earlier.

CLINICAL DATA: Neuro deficit, acute, stroke suspected.

EXAM:
CT HEAD WITHOUT CONTRAST
TECHNIQUE: Contiguous axial images were obtained from the base of the skull
through the vertex without intravenous contrast.

[Series 3: head 2.0 h70h · axial · 0.45mm/px · z∈[-104,+30]mm · 9 of 85 slices shown, 12 images]
[im 9/85  brain]
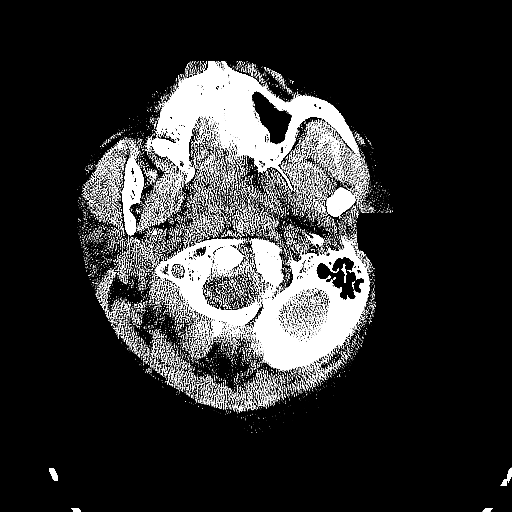
[im 9/85  bone]
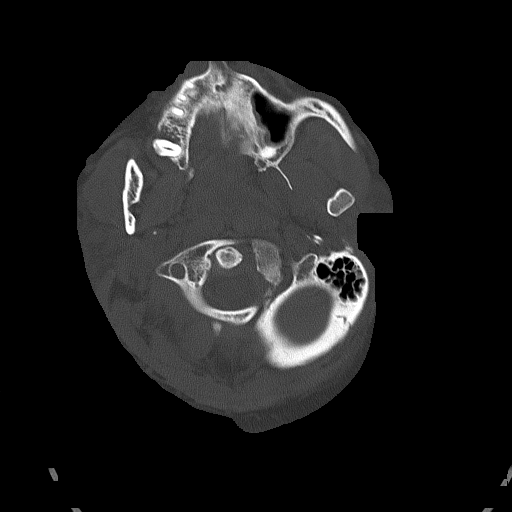
[im 17/85  brain]
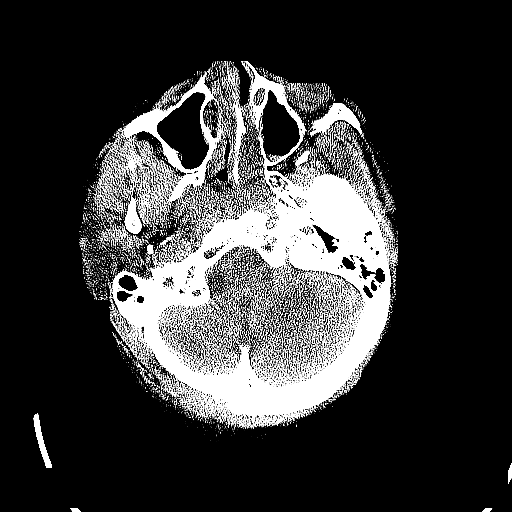
[im 26/85  brain]
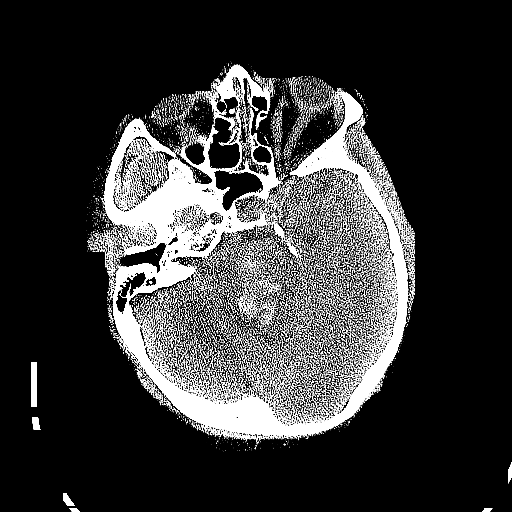
[im 34/85  brain]
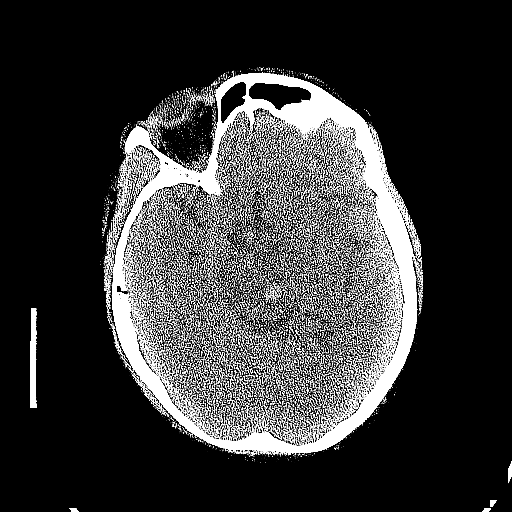
[im 43/85  brain]
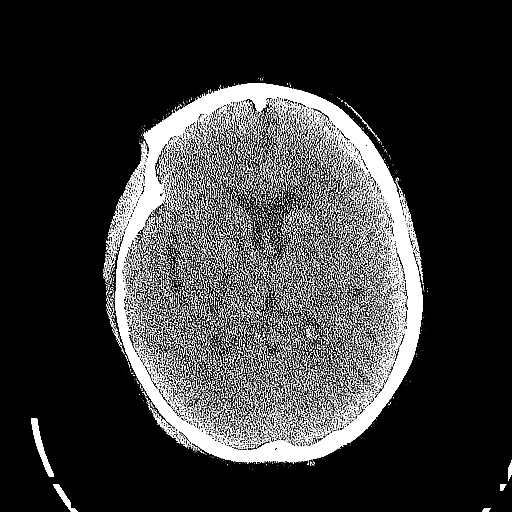
[im 43/85  bone]
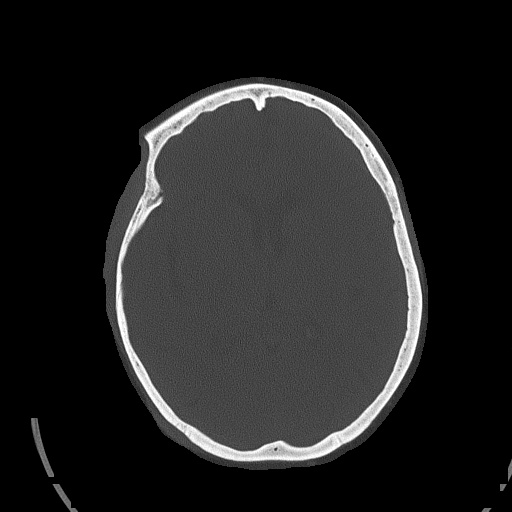
[im 51/85  brain]
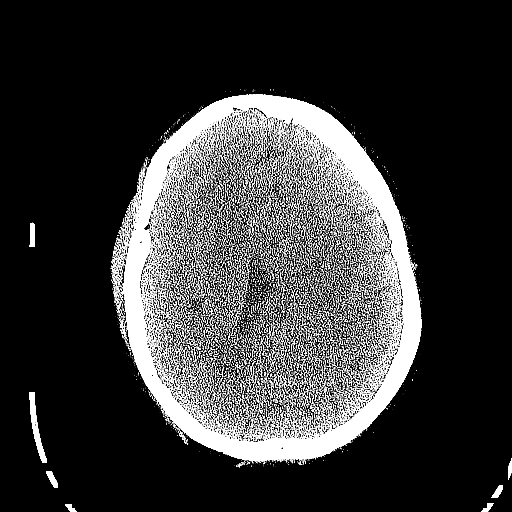
[im 59/85  brain]
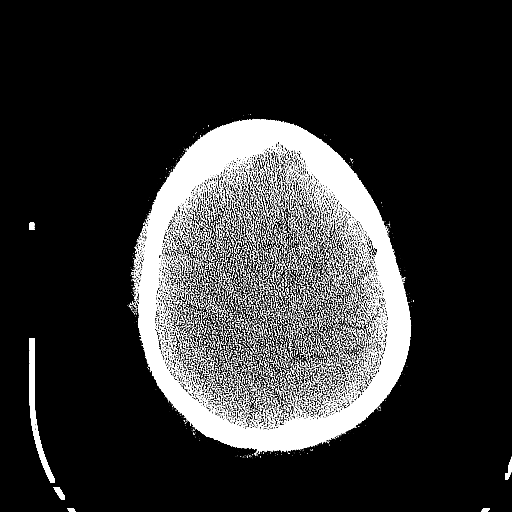
[im 68/85  brain]
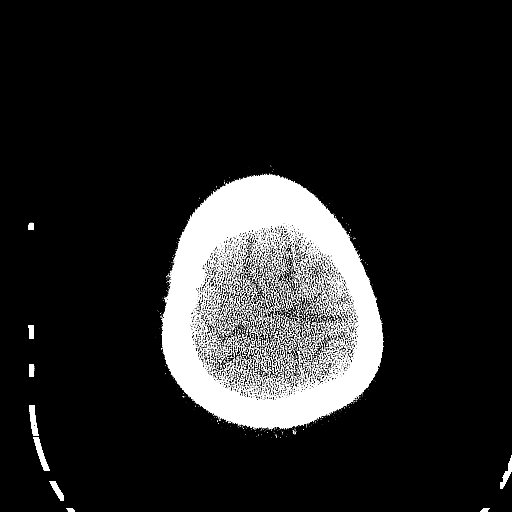
[im 76/85  brain]
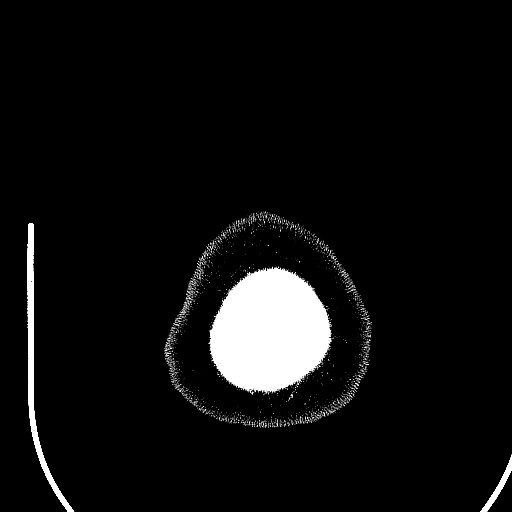
[im 76/85  bone]
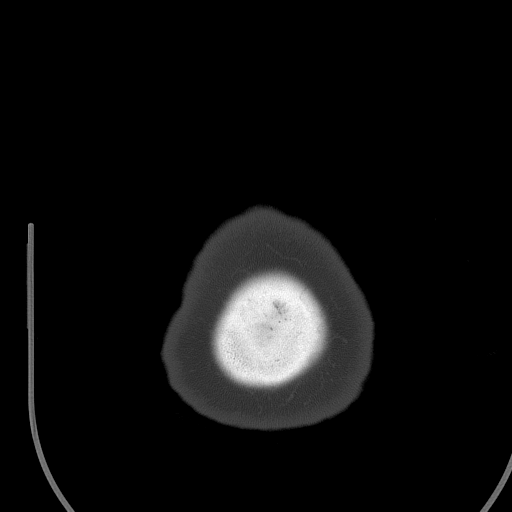

[Series 5: head 3.0 mpr cor · coronal · 0.33mm/px · 3 of 67 slices shown]
[im 23/67  brain]
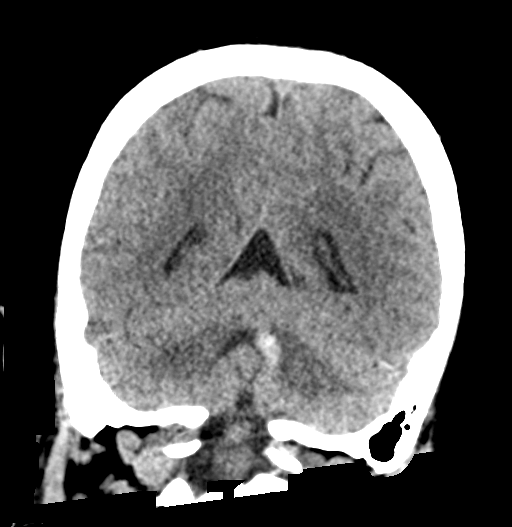
[im 30/67  brain]
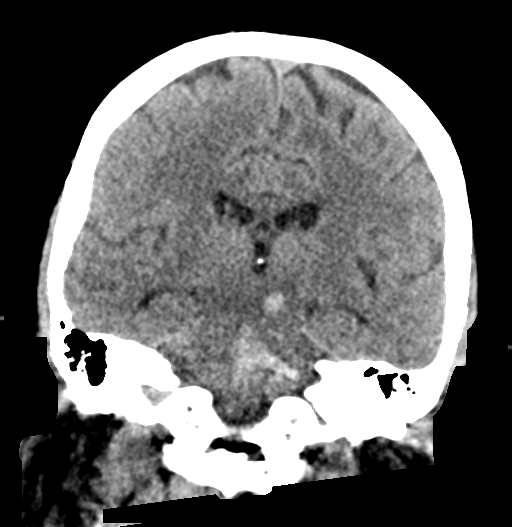
[im 37/67  brain]
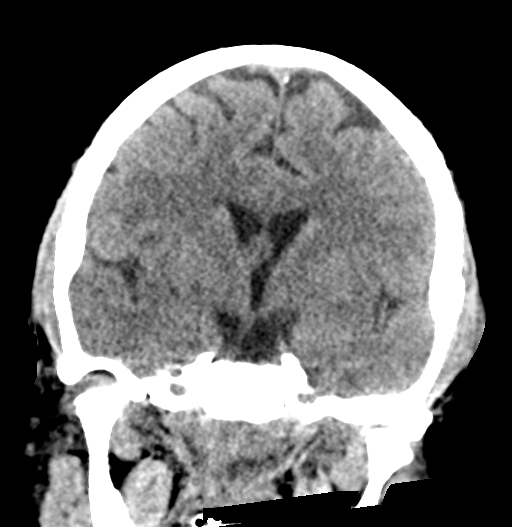

[Series 6: head 3.0 mpr sag · sagittal · 0.33mm/px · 3 of 55 slices shown]
[im 22/55  brain]
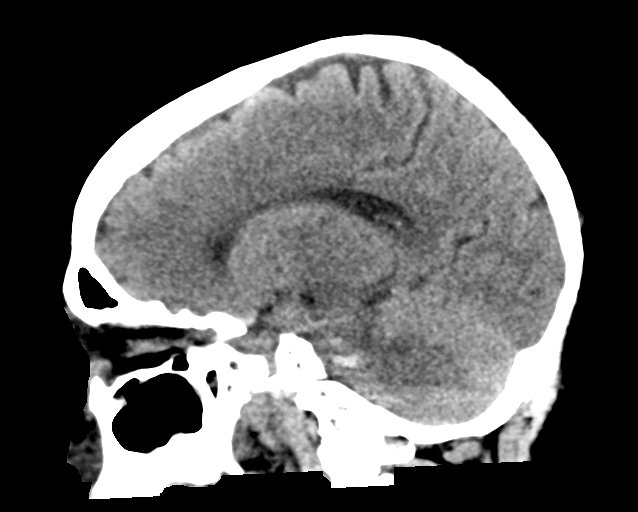
[im 28/55  brain]
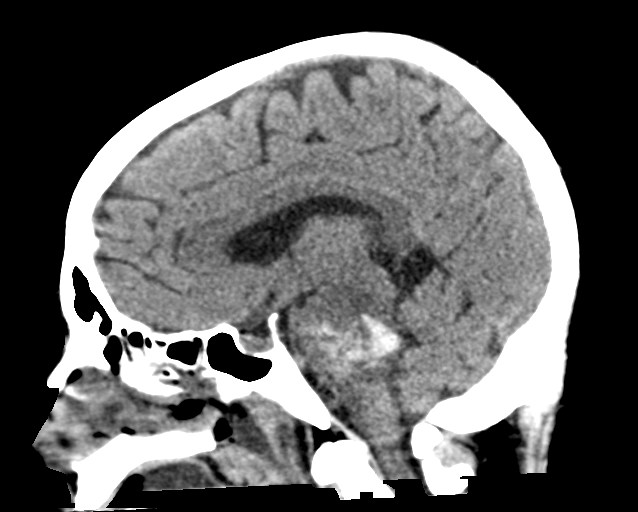
[im 33/55  brain]
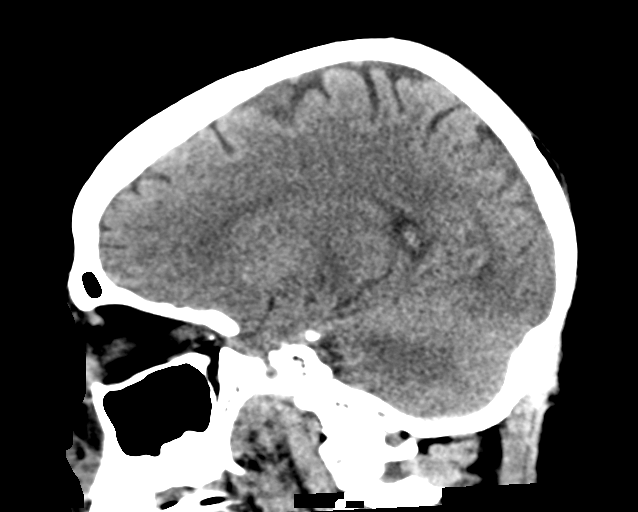

[15 of 47 positions shown; findings below may reference images not displayed]

FINDINGS: Brain:

Cerebral volume is normal.

A an acute parenchymal hemorrhage within the pons and midbrain has
not significantly changed in size as compared to the prior head CT
of [DATE]. Unchanged surrounding edema and mass effect with
partial effacement the fourth ventricle. As before, there is small
volume extension of hemorrhage into the fourth ventricle. No
evidence of hydrocephalus at this time.

Mild-to-moderate patchy and ill-defined hypoattenuation within the
cerebral white matter, nonspecific but compatible with chronic small
vessel ischemic disease.

Vascular: No hyperdense vessel.  Atherosclerotic calcifications.

Skull: Normal. Negative for fracture or focal lesion.

Sinuses/Orbits: Visualized orbits show no acute finding. Chronic
medially displaced fracture deformities of the lamina papyracea
bilaterally. Scattered mild paranasal sinus mucosal thickening.
Small volume frothy secretions within the right sphenoid sinus.

Other: Trace fluid within the bilateral mastoid air cells.
IMPRESSION: Acute parenchymal hemorrhage within the pons and midbrain, not
significant changed as compared to the head CT of [DATE].
Unchanged surrounding edema and mass effect with partial effacement
of the fourth ventricle. As before, there is probable small-volume
extension of hemorrhage into the fourth ventricle. No evidence of
hydrocephalus at this time.

Stable mild-to-moderate cerebral white matter chronic small vessel
ischemic disease.

Paranasal sinus disease, as described.

## 2021-07-10 MED ORDER — FREE WATER
200.0000 mL | Freq: Four times a day (QID) | Status: DC
  Administered 2021-07-10 – 2021-07-11 (×5): 200 mL

## 2021-07-10 MED ORDER — LABETALOL HCL 5 MG/ML IV SOLN
INTRAVENOUS | Status: AC
  Administered 2021-07-10: 20 mg via INTRAVENOUS
  Filled 2021-07-10: qty 4

## 2021-07-10 MED ORDER — CLONIDINE HCL 0.2 MG PO TABS
0.3000 mg | ORAL_TABLET | Freq: Three times a day (TID) | ORAL | Status: DC
  Administered 2021-07-10 – 2021-07-18 (×25): 0.3 mg
  Filled 2021-07-10 (×25): qty 1

## 2021-07-10 MED ORDER — ISOSORBIDE DINITRATE 20 MG PO TABS
40.0000 mg | ORAL_TABLET | Freq: Three times a day (TID) | ORAL | Status: DC
  Administered 2021-07-10 – 2021-07-18 (×25): 40 mg
  Filled 2021-07-10 (×27): qty 2

## 2021-07-10 MED ORDER — LABETALOL HCL 5 MG/ML IV SOLN: 20.0000 mg | Freq: Once | INTRAVENOUS | Status: AC

## 2021-07-10 MED ORDER — HYDRALAZINE HCL 50 MG PO TABS
100.0000 mg | ORAL_TABLET | Freq: Three times a day (TID) | ORAL | Status: DC
  Administered 2021-07-10 – 2021-07-18 (×25): 100 mg
  Filled 2021-07-10 (×25): qty 2

## 2021-07-10 MED ORDER — ATROPINE SULFATE 1 MG/ML IJ SOLN
INTRAMUSCULAR | Status: AC
  Filled 2021-07-10: qty 1

## 2021-07-10 NOTE — Progress Notes (Signed)
NAME:  Robin Navarro, MRN:  [id number], DOB:  1966-04-22, LOS: 4 ADMISSION DATE:  07/09/2021, CONSULTATION DATE:  07/15/2021 REFERRING MD:  Darl Householder, CHIEF COMPLAINT:  Slurred speech   Brief History   Large brainstem hemorrhage  History of present illness   55 year old woman w/ hx of kidney disease, HTN, previous pontine hemorrhage presenting with slurred speech that started around 4 AM.  When EMS arrived there was a question of PEA arrest and patient received CPR for several minutes however her initial SBP was 300?Marland Kitchen  King airway placed in field for coma which was exchanged for ETT when arrived to ER.  Stat head CT shows large brainstem hemorrhage.    Significant Hospital Events   10/4 admitted, CVC placed, removed due to malposition, made DNR by palliative care 10/5 Palliative care [place] meeting- Cont DNR, treat treatable, "address need for trach/feeding tube when needed" 10/6 PM Brady->Asystole. ROSC after 2 rounds of CPR 1 epi. Full code per patient. See Xu 10/6 1826 note.  Consults:  Neuro NSGY  Procedures:  N/a  Interim history/subjective:  Patient had 2 bradycardic event yesterday while ET tube suctioning or change in position Remained afebrile  Objective   Blood pressure 138/69, pulse 81, temperature 99.7 F (37.6 C), temperature source Axillary, resp. rate (!) 23, height '5\' 4"'$  (1.626 m), weight 78 kg, last menstrual period 12/23/2013, SpO2 95 %.    Vent Mode: PRVC FiO2 (%):  [40 %-50 %] 40 % Set Rate:  [18 bmp] 18 bmp Vt Set:  [430 mL] 430 mL PEEP:  [5 cmH20] 5 cmH20 Pressure Support:  [12 cmH20] 12 cmH20 Plateau Pressure:  [20 cmH20-22 cmH20] 22 cmH20   Intake/Output Summary (Last 24 hours) at 07/10/2021 1127 Last data filed at 07/10/2021 0851 Gross per 24 hour  Intake 2237.08 ml  Output 1200 ml  Net 1037.08 ml   Filed Weights   07/29/2021 0700 07/10/21 0414  Weight: 78 kg 78 kg    Examination:  General: Crtitically ill-appearing middle-aged African-American  female, orally intubated HEENT: [place]/AT, eyes anicteric.  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  Assessment & Plan:  Acute pontine/midbrain intraparenchymal hemorrhage in the setting of hypertensive emergency, ICH score of 4 Small IVH Cerebral edema Hypertensive emergency Acute encephalopathy due to hemorrhagic stroke Iatrogenic hypernatremia Continue neuro watch every hour Mental status remains the same, she is following commands Stroke team is following Will get CT head today Hypertonic saline was stopped as serum sodium goal was 150-155 and now her serum sodium is 161 Avoid sedation with RASS goal 0/-1 Blood pressure is not well controlled Still requiring clevidipine at 32 mg/h, will place her on clonidine, increase hydralazine and started on Isordil Try to taper off clevidipine infusion  S/p PEA cardiac arrest Patient had PEA cardiac arrest at home before admission and once after admission in the hospital, ROSC was achieved   Acute respiratory failure secondary hemorrhage acute stroke Aspiration pneumonia COVID 19, incidental Continue lung protective ventilation Patient is unable to tolerate a spontaneous breathing trial due to tachypnea and increased work of breathing Goals of care discussions are going on with the family, patient will  likely need tracheostomy Continue IV Unasyn to complete 7 days therapy Continue contact and airborne isolation  Anemia of chronic disease H&H remained stable Transfuse if less than 7  AKI on CKD5 Appreciate nephrology follow-up, recommended no  indication for dialysis yet Monitors serum creatinine, electrolytes and urine output  Coffee-ground emesis likely due to upper GI bleeding Continue Protonix IV twice daily H&H remained stable  Best practice:   Best Practice (right click and "Reselect all SmartList Selections" daily)   Diet/type: Tube feeds DVT prophylaxis: SCD GI prophylaxis: PPI Central venous access:  N/A Foley:  N/A Code Status:  full code Last date of multidisciplinary goals of care discussion [10/6: Palliative care, neurology and CCM had meeting with family, they would like to keep patient on full code    Critical care time:     Total critical care time: 41 minutes  Performed by: Jacky Kindle   Critical care time was exclusive of separately billable procedures and treating other patients.   Critical care was necessary to treat or prevent imminent or life-threatening deterioration.   Critical care was time spent personally by me on the following activities: development of treatment plan with patient and/or surrogate as well as nursing, discussions with consultants, evaluation of patient's response to treatment, examination of patient, obtaining history from patient or surrogate, ordering and performing treatments and interventions, ordering and review of laboratory studies, ordering and review of radiographic studies, pulse oximetry and re-evaluation of patient's condition.   Jacky Kindle MD [facility] See Amion for pager If no response to pager, please call [phone] until 7pm After 7pm, Please call E-link [phone]

## 2021-07-10 NOTE — Progress Notes (Signed)
Called CT for transport. RT notified.

## 2021-07-10 NOTE — Progress Notes (Addendum)
STROKE TEAM PROGRESS NOTE   STROKE TEAM PROGRESS NOTE   INTERVAL HISTORY  10/6 episode of cardiac arrest due to asystole yesterday and was resuscitated after 2 rounds of CPR with epinephrine x1. Subsequent transient bradycardia episodes which seem exacerbated by lying flat per nursing have limited follow up imaging. Discussed with Dr. Tacy Learn.  No visitors at bedside today. Remains intubated.  Blood pressure adequately controlled.  Serum sodium remains high at 163.  Ramains on Unasyn, half-normal saline and cleviprex infusion. Receiving tube feedings.    Vitals:   07/10/21 1000 07/10/21 1100 07/10/21 1300 07/10/21 1332  BP: (!) 146/75 138/69 (!) 162/84 (!) 147/77  Pulse: 88 81 97 94  Resp: (!) 24 (!) 23 (!) 24 18  Temp:      TempSrc:      SpO2: 96% 95% 94% 99%  Weight:      Height:       CBC:  Recent Labs  Lab 07/21/2021 0549 07/10/2021 0556 07/08/21 1825 07/09/21 0522 07/10/21 0438  WBC 11.6*   < > 17.3* 11.6* 11.4*  NEUTROABS 6.8  --  11.9*  --   --   HGB 9.8*   < > 7.9* 7.6* 7.5*  HCT 30.6*   < > 26.0* 24.5* 25.2*  MCV 88.2   < > 91.9 91.4 93.0  PLT 198   < > 183 166 156   < > = values in this interval not displayed.   Basic Metabolic Panel:  Recent Labs  Lab 07/09/21 0522 07/09/21 0956 07/09/21 1725 07/09/21 2214 07/10/21 0438  NA 163*   < > 164* 163* 161*  K 4.0  --   --   --  3.8  CL >130*  --   --   --  >130*  CO2 23  --   --   --  21*  GLUCOSE 99  --   --   --  110*  BUN 60*  --   --   --  68*  CREATININE 5.58*  --   --   --  5.99*  CALCIUM 9.1  --   --   --  8.7*  MG 2.2  --  2.3  --   --   PHOS 6.4*  --  4.8*  --   --    < > = values in this interval not displayed.   Lipid Panel:  Recent Labs  Lab 07/08/21 0354  CHOL 157  TRIG 349*  HDL 37*  CHOLHDL 4.2  VLDL 70*  LDLCALC 50   HgbA1c:  Recent Labs  Lab 07/08/21 0354  HGBA1C 5.1   Urine Drug Screen:  Recent Labs  Lab 07/03/2021 0912  LABOPIA NONE DETECTED  COCAINSCRNUR NONE DETECTED   LABBENZ NONE DETECTED  AMPHETMU NONE DETECTED  THCU NONE DETECTED  LABBARB NONE DETECTED    Alcohol Level No results for input(s): ETH in the last 168 hours.  IMAGING past 24 hours CT HEAD WO CONTRAST (5MM)  Result Date: 07/10/2021 CLINICAL DATA:  Neuro deficit, acute, stroke suspected. EXAM: CT HEAD WITHOUT CONTRAST TECHNIQUE: Contiguous axial images were obtained from the base of the skull through the vertex without intravenous contrast. COMPARISON:  Prior head CT examinations 07/14/2021 and earlier. FINDINGS: Brain: Cerebral volume is normal. A an acute parenchymal hemorrhage within the pons and midbrain has not significantly changed in size as compared to the prior head CT of 07/25/2021. Unchanged surrounding edema and mass effect with partial effacement the fourth ventricle. As before, there  is small volume extension of hemorrhage into the fourth ventricle. No evidence of hydrocephalus at this time. Mild-to-moderate patchy and ill-defined hypoattenuation within the cerebral white matter, nonspecific but compatible with chronic small vessel ischemic disease. Vascular: No hyperdense vessel.  Atherosclerotic calcifications. Skull: Normal. Negative for fracture or focal lesion. Sinuses/Orbits: Visualized orbits show no acute finding. Chronic medially displaced fracture deformities of the lamina papyracea bilaterally. Scattered mild paranasal sinus mucosal thickening. Small volume frothy secretions within the right sphenoid sinus. Other: Trace fluid within the bilateral mastoid air cells. IMPRESSION: Acute parenchymal hemorrhage within the pons and midbrain, not significant changed as compared to the head CT of 07/12/2021. Unchanged surrounding edema and mass effect with partial effacement of the fourth ventricle. As before, there is probable small-volume extension of hemorrhage into the fourth ventricle. No evidence of hydrocephalus at this time. Stable mild-to-moderate cerebral white matter chronic  small vessel ischemic disease. Paranasal sinus disease, as described. Electronically Signed   By: Kellie Simmering D.O.   On: 07/10/2021 12:32    PHYSICAL EXAM [name] is intubated. General - Well nourished, well developed, intubated off sedation.      Cardiovascular - Regular rate and rhythm.   Neuro - intubated off sedation, eyes open, able to follow simple command on the left. With eye opening, eyes in mid position, not blinking to visual threat bilaterally, able to have downward gaze bilaterally, and partial upward gaze bilaterally, however no movement horizontally, pupil right 1.5 mm and left 33m, nonreactive to light.  Corneal reflex absent bilaterally, gag and cough present. Breathing over the vent.  Facial symmetry not able to test due to ET tube.  Tongue protrusion not cooperative.  Left upper extremity proximal 0/5 but bicep 3-/5 barely against gravity, finger grip 3/5.  Left lower extremity proximal 2/5, distal toe DF/PF 3+/5.  Right UE flaccid proximally but finger grip 2/5, and right LE slight withdraw to pain stimulation, no movement of toes. DTR diminished and no babinski. Sensation, coordination not corporative and gait not tested.     ASSESSMENT/PLAN Ms. MMAYU ROCKHOLTis a 55y.o. 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 CT head large pontine and midbrain ICH with small IVH CT repeat stable hematoma, questionable slight increase ventricular size CT repeat pending today due to patient's hemodynamic instability 2D Echo EF 60 to 65% in 01/2021 LDL 50 HgbA1c 5.1 UDS negative SCDs for VTE prophylaxis No antithrombotic prior to admission, now on No antithrombotic due to [place] recommendations:  Pending Disposition: Pending    Cerebral edema CT showed focal mass-effect at the brainstem On 3% saline @ 50 ->  off Sodium 143->148->152->151->159->161->162->161 Na goal 150-155 Na monitoring On FW 100 Q6h   History of ICH 01/2021 admitted for headache and slurred speech as well as hypertensive emergency.  CT showed right small pontine ICH.  EF 60 to 65%.  LDL 69, A1c 5.3.  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     COVID PCR positive incidentally  Isolation CCM on board  Anemia Hgb 9.1->7.9->7.6->7.5 Iron deficiency  Transfuse hgb less than 7  S/p cardiac arrest 10/6 Bradycardia episodes   atient had episode of transient or systolic cardiac arrest with was revived.  She continues to have some intermittent bradycardia which may be due to brainstem autonomic dysfunction related to her hemorrhage.  Dysphagia Has OG tube On TF and FW Consider cortrak Monday    Tobacco abuse Current smoker Smoking cessation counseling will be provided   Other Stroke Risk Factors History of THC use, UDS negative this time   Other Active Problems     ATTENDING ATTESTATION:  Dr. Reeves Forth evaluated pt independently, reviewed imaging, chart, labs. Discussed and formulated plan with the APP. Please see APP note above for details.      This patient is critically ill due to respiratory distress, stroke  s/p tPA and at significant risk of neurological worsening, death form heart failure, respiratory failure, recurrent stroke, bleeding from [facility], seizure, sepsis. This patient's care requires constant monitoring of vital signs, hemodynamics, respiratory and cardiac monitoring, review of multiple databases, neurological assessment, discussion with family, other specialists and medical decision making of high complexity. I spent 35 minutes of neurocritical care time in the care of this patient.   Curly Mackowski,MD  To contact Stroke Continuity provider, please refer to [email or web address] After hours, contact General Neurology After hours, contact General Neurology

## 2021-07-10 NOTE — Progress Notes (Signed)
Pt tachycardic and hypertensive. EKG obtained due to tachycardia. CCM contacted and EKG shown. Orders obtained for IV Labetalol '20mg'$ . Given per order.

## 2021-07-10 NOTE — Progress Notes (Signed)
[facility] Progress Note Patient Name: Robin Navarro DOB: 1966-01-21 MRN: [id number]   Date of Service  07/10/2021  HPI/Events of Note  Na+ = 164 --> 161. 0.45 NaCl infusion X 10 hours now completed. Goal Na+ = 150-155.  eICU Interventions  Plan: Will increase free water per tube to 200 mL Q 6 hours.     Intervention Category Major Interventions: Electrolyte abnormality - evaluation and management  Lucero Ide Eugene 07/10/2021, 6:09 AM

## 2021-07-10 NOTE — Progress Notes (Signed)
Will transport pt to CT with RT per CCM MD order.

## 2021-07-10 NOTE — Progress Notes (Signed)
Pt transported to CT and back to 4N 23 on full vent support. No complications noted.

## 2021-07-10 NOTE — Progress Notes (Signed)
[facility] Progress Note  Assessment/Plan **pontine hemorrrhage: large hemorrhage in setting of uncontrolled HTN.  Currently in ICU on hypertonic saline as dosed per neurology - goal noted to be 150-155 and 161 this AM; will defer to their management re: dec dose +/- giving back free water.  BP control per PCCM/neuro.  PMT following - DNR reversed, cont med care, address trach/PEG when time comes.   **AKI on CKD:  advanced CKD at baseline now with worse renal function in setting of HTN emergency and head bleed.  Foley UA with small blood - nothing unexpected.  Currently renal function stable with good UOP and normal electrolytes (except intentional hypernatremia) and acid/base.  No current indications for dialysis but certainly not much reserve here.  Overall functional status will help guide dialysis plans as it will affect potential disposition.  Will follow closely to help navigate this.   Following I/Os, dose meds for CrCl < 15.    **hypernatremia: therapeutic for increased ICP - per above, defer to neurology re: > goal.     **HTN emergency: acute treatment per PCCM.    **Anemia: Hb in 7s.  Labs showing iron deficiency; would replete.  Transfuse < 7.    **incidentally COVID +  Will follow, call with questions.  No family bedside but I'll plan to update them on kidney function this weekend.    ------------------------------------------------------------------------------------ Subjective:   Remains criticially ill on vent with significant pontine hemorrhage but now following commands on L have having some improvement.  UOP 1150m yesterday.  Having some bradycardic episodes with suctioning and turning still.  Remains on IV antiHTN, po meds being titrated.   Objective Vitals:   07/10/21 0945 07/10/21 0950 07/10/21 1000 07/10/21 1100  BP: (!) 167/89 (!) 152/77 (!) 146/75 138/69  Pulse: (!) 134 (!) 103 88 81  Resp: (!) 28 18 (!) 24 (!) 23  Temp:      TempSrc:      SpO2:  99% 97% 96% 95%  Weight:      Height:       Physical Exam General: intubated Heart: NSR on monitor Lungs: vent 40% fio2 Neuro: arouses to voice, can move L on command  Additional Objective Labs: Basic Metabolic Panel: Recent Labs  Lab 07/08/21 1742 07/08/21 1825 07/08/21 2216 07/09/21 0522 07/09/21 0956 07/09/21 1725 07/09/21 2214 07/10/21 0438  NA 163* 162*   < > 163*   < > 164* 163* 161*  K  --  3.8  --  4.0  --   --   --  3.8  CL  --  >130*  --  >130*  --   --   --  >130*  CO2  --  21*  --  23  --   --   --  21*  GLUCOSE  --  158*  --  99  --   --   --  110*  BUN  --  57*  --  60*  --   --   --  68*  CREATININE  --  5.54*  --  5.58*  --   --   --  5.99*  CALCIUM  --  9.3  --  9.1  --   --   --  8.7*  PHOS 6.0*  --   --  6.4*  --  4.8*  --   --    < > = values in this interval not displayed.    Liver Function Tests: Recent Labs  Lab 07/11/2021 0549 07/08/21 1825  AST 124* 43*  ALT 82* 48*  ALKPHOS 95 57  BILITOT 0.5 0.4  PROT 7.5 5.9*  ALBUMIN 3.7 2.5*    No results for input(s): LIPASE, AMYLASE in the last 168 hours. CBC: Recent Labs  Lab 07/30/2021 0549 07/05/2021 0556 07/08/21 0354 07/08/21 1825 07/09/21 0522 07/10/21 0438  WBC 11.6*  --  13.6* 17.3* 11.6* 11.4*  NEUTROABS 6.8  --   --  11.9*  --   --   HGB 9.8*   < > 9.1* 7.9* 7.6* 7.5*  HCT 30.6*   < > 28.3* 26.0* 24.5* 25.2*  MCV 88.2  --  91.6 91.9 91.4 93.0  PLT 198  --  196 183 166 156   < > = values in this interval not displayed.    Blood Culture    Component Value Date/Time   SDES TRACHEAL ASPIRATE 07/07/2021 1048   SPECREQUEST NONE 07/07/2021 1048   CULT  07/07/2021 1048    Normal respiratory flora-no Staph aureus or Pseudomonas seen Performed at [facility], [place] [address], [place], [place] [address]    REPTSTATUS 07/09/2021 FINAL 07/07/2021 1048    Cardiac Enzymes: No results for input(s): CKTOTAL, CKMB, CKMBINDEX, TROPONINI in the last 168 hours. CBG: Recent Labs   Lab 07/09/21 1658 07/09/21 2016 07/09/21 2336 07/10/21 0411 07/10/21 0848  GLUCAP 123* 124* 114* 109* 108*    Iron Studies:  Recent Labs    07/10/21 0438  IRON 10*  TIBC 181*  FERRITIN 294   _0 @ Studies/Results: No results found. Medications:  ampicillin-sulbactam (UNASYN) IV 1.5 g (07/10/21 0921)   clevidipine 32 mg/hr (07/10/21 1127)   feeding supplement (VITAL AF 1.2 CAL) 1,000 mL (07/09/21 1653)    amLODipine  10 mg Per Tube Daily   atropine       chlorhexidine gluconate (MEDLINE KIT)  15 mL Mouth Rinse BID   Chlorhexidine Gluconate Cloth  6 each Topical Daily   cloNIDine  0.3 mg Per Tube TID   free water  200 mL Per Tube Q6H   hydrALAZINE  100 mg Per Tube TID   insulin aspart  0-9 Units Subcutaneous Q4H   isosorbide dinitrate  40 mg Per Tube TID   mouth rinse  15 mL Mouth Rinse 10 times per day   pantoprazole (PROTONIX) IV  40 mg Intravenous BID   senna-docusate  1 tablet Per Tube BID     Jannifer Hick MD 07/10/2021, 12:06 PM  [facility] Pager: [phone]

## 2021-07-11 LAB — RENAL FUNCTION PANEL
Albumin: 2.2 g/dL — ABNORMAL LOW (ref 3.5–5.0)
Anion gap: 12 (ref 5–15)
BUN: 78 mg/dL — ABNORMAL HIGH (ref 6–20)
CO2: 20 mmol/L — ABNORMAL LOW (ref 22–32)
Calcium: 8.5 mg/dL — ABNORMAL LOW (ref 8.9–10.3)
Chloride: 129 mmol/L — ABNORMAL HIGH (ref 98–111)
Creatinine, Ser: 6.3 mg/dL — ABNORMAL HIGH (ref 0.44–1.00)
GFR, Estimated: 7 mL/min — ABNORMAL LOW (ref >60)
Glucose, Bld: 106 mg/dL — ABNORMAL HIGH (ref 70–99)
Phosphorus: 5.5 mg/dL — ABNORMAL HIGH (ref 2.5–4.6)
Potassium: 3.8 mmol/L (ref 3.5–5.1)
Sodium: 161 mmol/L (ref 135–145)

## 2021-07-11 LAB — GLUCOSE, CAPILLARY
Glucose-Capillary: 108 mg/dL — ABNORMAL HIGH (ref 70–99)
Glucose-Capillary: 120 mg/dL — ABNORMAL HIGH (ref 70–99)
Glucose-Capillary: 127 mg/dL — ABNORMAL HIGH (ref 70–99)
Glucose-Capillary: 130 mg/dL — ABNORMAL HIGH (ref 70–99)
Glucose-Capillary: 132 mg/dL — ABNORMAL HIGH (ref 70–99)
Glucose-Capillary: 151 mg/dL — ABNORMAL HIGH (ref 70–99)

## 2021-07-11 LAB — TRIGLYCERIDES: Triglycerides: 101 mg/dL (ref <150)

## 2021-07-11 LAB — SODIUM
Sodium: 161 mmol/L (ref 135–145)
Sodium: 162 mmol/L (ref 135–145)
Sodium: 159 mmol/L — ABNORMAL HIGH (ref 135–145)

## 2021-07-11 MED ORDER — FERROUS SULFATE 220 (44 FE) MG/5ML PO ELIX
220.0000 mg | ORAL_SOLUTION | Freq: Every day | ORAL | Status: DC
  Administered 2021-07-15 – 2021-07-18 (×4): 220 mg
  Filled 2021-07-11 (×5): qty 5

## 2021-07-11 MED ORDER — FREE WATER
300.0000 mL | Freq: Four times a day (QID) | Status: DC
  Administered 2021-07-11 – 2021-07-15 (×15): 300 mL

## 2021-07-11 MED ORDER — SODIUM CHLORIDE 0.9 % IV SOLN
250.0000 mg | Freq: Every day | INTRAVENOUS | Status: AC
  Administered 2021-07-11 – 2021-07-14 (×4): 250 mg via INTRAVENOUS
  Filled 2021-07-11 (×4): qty 20

## 2021-07-11 MED ORDER — SODIUM CHLORIDE 0.9 % IV SOLN
INTRAVENOUS | Status: DC | PRN
  Administered 2021-07-11 (×2): 250 mL via INTRAVENOUS

## 2021-07-11 NOTE — Progress Notes (Signed)
Pharmacy Antibiotic Note  Robin Navarro is a 55 y.o. female admitted on 07/25/2021 with  aspiration pneumonia .  Pharmacy was consulted to switch from Rocephin to Unasyn given worsening leukocytosis.    SCr up to 5.99, CrCL 11 ml/min, now afebrile, WBC 11.4.  Plan: Continue Unasyn 1.5gm IV Q12H - CCM planning 7 days Monitor renal fxn, clinical progress  Height: '5\' 4"'$  (162.6 cm) Weight: 82.3 kg (181 lb 7 oz) (1 pillow,1 blanket, 1 sheet) IBW/kg (Calculated) : 54.7  Temp (24hrs), Avg:99.6 F (37.6 C), Min:99.6 F (37.6 C), Max:99.6 F (37.6 C)  Recent Labs  Lab 07/05/2021 0549 07/16/2021 0556 07/07/21 1013 07/08/21 0354 07/08/21 1825 07/08/21 1826 07/08/21 2216 07/09/21 0522 07/10/21 0438  WBC 11.6*  --   --  13.6* 17.3*  --   --  11.6* 11.4*  CREATININE 4.90*   < > 5.31* 5.27* 5.54*  --   --  5.58* 5.99*  LATICACIDVEN  --   --   --   --   --  1.8 1.0  --   --    < > = values in this interval not displayed.     Estimated Creatinine Clearance: 11.3 mL/min (A) (by C-G formula based on SCr of 5.99 mg/dL (H)).    Allergies  Allergen Reactions   Lisinopril Cough    CTX 10/4 >10/6 Unasyn 10/6 >>    10/5 TA >> negF   [name], PharmD, BCPS, [place] 07/11/2021, 10:33 AM

## 2021-07-11 NOTE — Progress Notes (Signed)
Patient had brief moment of asystole while coughing on ventilator. Upon entering room, patient HR sustaining 70-80s and awake, following commands. Upon review of monitor strip, there was approx 45 sec interval with 4 long pauses between beats.

## 2021-07-11 NOTE — Progress Notes (Signed)
[facility] Progress Note  Assessment/Plan **pontine hemorrrhage: large hemorrhage in setting of uncontrolled HTN.  Currently in ICU on hypertonic saline as dosed per neurology - goal noted to be 150-155 and 161 this AM; will defer to their management re: dec dose +/- giving back free water.  BP control per PCCM/neuro.  PMT following - DNR reversed, cont med care, address trach/PEG when time comes.   **AKI on CKD:  advanced CKD at baseline now with worse renal function in setting of HTN emergency and head bleed.  Foley UA with small blood - nothing unexpected.  Currently renal function stable with good UOP and normal electrolytes (except intentional hypernatremia) and acid/base.  No current indications for dialysis but certainly not much reserve here.  Overall functional status will help guide dialysis plans as it will affect potential disposition.  Will follow closely to help navigate this.   Following I/Os, dose meds for CrCl < 15.  Labs this AM pending, will f/u.   **hypernatremia: therapeutic for increased ICP - per above, defer to neurology re: > goal.     **HTN emergency: acute treatment per PCCM.    **Anemia: Hb in 7s.  Labs showing iron deficiency; would replete.  Transfuse < 7.    **incidentally COVID +  Will follow, call with questions.    ------------------------------------------------------------------------------------ Subjective:   Remains criticially ill on vent with significant pontine hemorrhage but now following commands on L have having some improvement. CT head yesterday stable. UOP 1577m yesterday.  Finally off IV antiHTN meds.   Objective Vitals:   07/11/21 0645 07/11/21 0700 07/11/21 0715 07/11/21 0800  BP: 130/69 129/68 129/71 131/71  Pulse: 74 71 72 70  Resp: (!) 22 (!) 21 (!) 21 (!) 22  Temp:    99.6 F (37.6 C)  TempSrc:    Axillary  SpO2: 99% 99% 99% 98%  Weight:      Height:       Physical Exam General: intubated Heart: NSR on  monitor Lungs: vent 40% fio2 Neuro: arouses to voice, can move L on command  Additional Objective Labs: Basic Metabolic Panel: Recent Labs  Lab 07/08/21 1742 07/08/21 1825 07/08/21 2216 07/09/21 0522 07/09/21 0956 07/09/21 1725 07/09/21 2214 07/10/21 0438 07/10/21 1638 07/10/21 2142 07/11/21 0405  NA 163* 162*   < > 163*   < > 164*   < > 161* 160* 162* 161*  K  --  3.8  --  4.0  --   --   --  3.8  --   --   --   CL  --  >130*  --  >130*  --   --   --  >130*  --   --   --   CO2  --  21*  --  23  --   --   --  21*  --   --   --   GLUCOSE  --  158*  --  99  --   --   --  110*  --   --   --   BUN  --  57*  --  60*  --   --   --  68*  --   --   --   CREATININE  --  5.54*  --  5.58*  --   --   --  5.99*  --   --   --   CALCIUM  --  9.3  --  9.1  --   --   --  8.7*  --   --   --   PHOS 6.0*  --   --  6.4*  --  4.8*  --   --   --   --   --    < > = values in this interval not displayed.    Liver Function Tests: Recent Labs  Lab 07/12/2021 0549 07/08/21 1825  AST 124* 43*  ALT 82* 48*  ALKPHOS 95 57  BILITOT 0.5 0.4  PROT 7.5 5.9*  ALBUMIN 3.7 2.5*    No results for input(s): LIPASE, AMYLASE in the last 168 hours. CBC: Recent Labs  Lab 07/22/2021 0549 07/23/2021 0556 07/08/21 0354 07/08/21 1825 07/09/21 0522 07/10/21 0438  WBC 11.6*  --  13.6* 17.3* 11.6* 11.4*  NEUTROABS 6.8  --   --  11.9*  --   --   HGB 9.8*   < > 9.1* 7.9* 7.6* 7.5*  HCT 30.6*   < > 28.3* 26.0* 24.5* 25.2*  MCV 88.2  --  91.6 91.9 91.4 93.0  PLT 198  --  196 183 166 156   < > = values in this interval not displayed.    Blood Culture    Component Value Date/Time   SDES TRACHEAL ASPIRATE 07/07/2021 1048   SPECREQUEST NONE 07/07/2021 1048   CULT  07/07/2021 1048    Normal respiratory flora-no Staph aureus or Pseudomonas seen Performed at [facility], [place] [address], [place], [place] [address]    REPTSTATUS 07/09/2021 FINAL 07/07/2021 1048    Cardiac Enzymes: No results for  input(s): CKTOTAL, CKMB, CKMBINDEX, TROPONINI in the last 168 hours. CBG: Recent Labs  Lab 07/10/21 1551 07/10/21 2104 07/11/21 0007 07/11/21 0431 07/11/21 0748  GLUCAP 146* 137* 151* 108* 132*    Iron Studies:  Recent Labs    07/10/21 0438  IRON 10*  TIBC 181*  FERRITIN 294    _0 @ Studies/Results: CT HEAD WO CONTRAST (5MM)  Result Date: 07/10/2021 CLINICAL DATA:  Neuro deficit, acute, stroke suspected. EXAM: CT HEAD WITHOUT CONTRAST TECHNIQUE: Contiguous axial images were obtained from the base of the skull through the vertex without intravenous contrast. COMPARISON:  Prior head CT examinations 07/12/2021 and earlier. FINDINGS: Brain: Cerebral volume is normal. A an acute parenchymal hemorrhage within the pons and midbrain has not significantly changed in size as compared to the prior head CT of 08/02/2021. Unchanged surrounding edema and mass effect with partial effacement the fourth ventricle. As before, there is small volume extension of hemorrhage into the fourth ventricle. No evidence of hydrocephalus at this time. Mild-to-moderate patchy and ill-defined hypoattenuation within the cerebral white matter, nonspecific but compatible with chronic small vessel ischemic disease. Vascular: No hyperdense vessel.  Atherosclerotic calcifications. Skull: Normal. Negative for fracture or focal lesion. Sinuses/Orbits: Visualized orbits show no acute finding. Chronic medially displaced fracture deformities of the lamina papyracea bilaterally. Scattered mild paranasal sinus mucosal thickening. Small volume frothy secretions within the right sphenoid sinus. Other: Trace fluid within the bilateral mastoid air cells. IMPRESSION: Acute parenchymal hemorrhage within the pons and midbrain, not significant changed as compared to the head CT of 07/25/2021. Unchanged surrounding edema and mass effect with partial effacement of the fourth ventricle. As before, there is probable small-volume extension  of hemorrhage into the fourth ventricle. No evidence of hydrocephalus at this time. Stable mild-to-moderate cerebral white matter chronic small vessel ischemic disease. Paranasal sinus disease, as described. Electronically Signed   By: Kellie Simmering D.O.   On: 07/10/2021 12:32  Medications:  ampicillin-sulbactam (UNASYN) IV Stopped (07/10/21 2215)   clevidipine 4 mg/hr (07/11/21 0427)   feeding supplement (VITAL AF 1.2 CAL) 1,000 mL (07/11/21 0750)    amLODipine  10 mg Per Tube Daily   chlorhexidine gluconate (MEDLINE KIT)  15 mL Mouth Rinse BID   Chlorhexidine Gluconate Cloth  6 each Topical Daily   cloNIDine  0.3 mg Per Tube TID   free water  200 mL Per Tube Q6H   hydrALAZINE  100 mg Per Tube TID   insulin aspart  0-9 Units Subcutaneous Q4H   isosorbide dinitrate  40 mg Per Tube TID   mouth rinse  15 mL Mouth Rinse 10 times per day   pantoprazole (PROTONIX) IV  40 mg Intravenous BID   senna-docusate  1 tablet Per Tube BID     Jannifer Hick MD 07/11/2021, 8:08 AM  [facility] Pager: [phone]

## 2021-07-11 NOTE — Progress Notes (Signed)
Daily Progress Note   Patient Name: Robin Navarro       Date: 07/11/2021 DOB: 1966/08/29  Age: 55 y.o. MRN#: [id number] Attending Physician: Stroke, Md, MD Primary Care Physician: [name], MD Admit Date: 07/15/2021 Length of Stay: 5 days  Reason for Consultation/Follow-up: Establishing goals of care  HPI/Patient Profile:   55 y.o. 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 EMS responded with possible PEA arrest requiring CPR in the field for several minutes and noted profound hypertension. Required King airway in the field and converted to ETT in the ER. Stat head CT with large brainstem hemorrhage (approx 16 mL). PCCM, neurology, nephrology have all been consulted.   Noted AKI on CKD, seen by nephrology who is holding on HD given her current condition. Ongoing/planned neurology and neurosurgery evaluation.  Possible aspiration pneumonitis. Remains on the vent, PCCM feels suspected terminal brain bleed but time for outcomes. Repeat CT 07/10/21 with no significant change.   PMT was consulted for goals of care/family discussion given severe/likely terminal condition.  Subjective:   Subjective: Chart Reviewed. Updates received. Patient Assessed. Created space and opportunity for patient  and family to explore thoughts and feelings regarding current medical situation.  Today's Discussion: I met with the patient and her son [name] at the bedside. The patient is not able to participate in the discussion. I had an extensive discussion with [name], when we discussed how having a large, involved family can be difficult because of multiple opinions, personality types, and grieving styles. It is hard for him to, in a sense, make everyone happy. I offered that in all of this, in the difficult situation his mom is facing, that it is really of prime importance to try and act on his mom's behalf based on what  he feels HER wishes would be. I offered when discussing with family, that with so many variable opinions that it'd be impossible to agree with everyone, but to acknowledge that each person feels each way, and advocates for decisions, with good and loving intention; that just because a decision is or is not made any particular way, that we can all agree each person loves Robin Navarro.  [name] shares that the doctors are wanting a decision on trach (and possibly peg?) placement by tomorrow. He is wanting the doctors to meet with the family to explain the trach, risks, benefits, to help in decision making; [name] seems tired with attempts to explain things to multiple family members. I discussed with the nurse that when family is here today, to see if a PCCM provider can meet with them to talk; I offered to attend if at all possible. I also brought up the improtance of knowing what the "future" likely looks like for each decision; the issue of if multiple medical specialties are needed long term, that there may not be a local facility able to provide the specific long term care that she needs.  I provided emotional support and general support through therapeutic listening. I encouraged [name] to provide self care to himself (eating enough, resting, etc). I answered all questions and addressed all concerns.  Review of Systems  Unable to perform ROS: Intubated   Objective:   Vital Signs:  BP 131/71   Pulse 70   Temp 99.6 F (37.6 C) (Axillary)   Resp (!) 22   Ht 5' 4"  (1.626 m)   Wt 82.3 kg Comment: 1 pillow,1  blanket, 1 sheet  LMP 12/23/2013   SpO2 98%   BMI 31.14 kg/m   Physical Exam: Physical Exam Vitals and nursing note reviewed.  Constitutional:      General: She is sleeping.     Comments: Appears to arouse less easily today  HENT:     Head: Normocephalic and atraumatic.  Cardiovascular:     Rate and Rhythm: Normal rate.     Heart sounds: No murmur heard. Pulmonary:     Effort: No  respiratory distress.     Breath sounds: Wheezing present. No rhonchi.  Abdominal:     General: Abdomen is flat.     Palpations: Abdomen is soft.  Skin:    General: Skin is warm and dry.  Neurological:     Comments: Appears to follow commands less readily today, not tracking like she was previously/opening eyes to voice.    Palliative Assessment/Data: 10% (intubated, OGT)   Assessment & Plan:   Impression: Present on Admission: . Brainstem hemorrhage ([place]) . ICH (intracerebral hemorrhage) ([place])  Very ill 55 year old female who has suffered a large brainstem bleed. No hernaition at this point, but remains at high risk. Question PEA arrest in the field. Repeat CT head around showed no further bleeding, persistent hematoma, question ventricular changes. Currently has regained some critical reflexes (gag, cough) and still without pupil reaction to light; See neurology full note for details. Has had some responsiveness development, but still high risk for worsening and mortality. Family aware of how sick she is. Indications that she would not want artificial prolongation of life without ability to interact or have quality. Family has switched her back to full code after code event. Regarding trach/feeding tube: They understand this may need to be discussed. They're not opped to these in theory, if it'll help her recovery. However, given how unstable the situation is they want to hold off on absolute decisions until needed to allow time for clinical status to evolce/indication of tradjectory. Son [name] seems to be struggling with the burden of decisions with multiple family members involved in discussion. He will need support and guidance, assistance with grounding family to try and prevent family dynamics issues  SUMMARY OF RECOMMENDATIONS   Continue full code based on family wishes Continue to treat the treatable Address need for trach/feeding tube when necessary (tomorrow?) Continued family  support; especially [name] Continued spiritual care PMT will continue to follow  Code Status: Full code  Prognosis: Unable to determine  Discharge Planning: To Be Determined  Discussed with: Medical team, nursing team, patient family  Thank you for allowing [place] to participate in the care of Robin Navarro PMT will continue to support holistically.  Time Total: 75 min  Visit consisted of counseling and education dealing with the complex and emotionally intense issues of symptom management and palliative care in the setting of serious and potentially life-threatening illness. Greater than 50%  of this time was spent counseling and coordinating care related to the above assessment and plan.  Walden Field, NP Palliative Medicine Team  Team Phone # [phone] (Nights/Weekends)  06/01/2021, 8:17 AM

## 2021-07-11 NOTE — Progress Notes (Signed)
NAME:  Robin Navarro, MRN:  [id number], DOB:  1966-07-03, LOS: 5 ADMISSION DATE:  07/27/2021, CONSULTATION DATE:  07/22/2021 REFERRING MD:  Darl Householder, CHIEF COMPLAINT:  Slurred speech   Brief History   Large brainstem hemorrhage  History of present illness   55 year old woman w/ hx of kidney disease, HTN, previous pontine hemorrhage presenting with slurred speech that started around 4 AM.  When EMS arrived there was a question of PEA arrest and patient received CPR for several minutes however her initial SBP was 300?Marland Kitchen  King airway placed in field for coma which was exchanged for ETT when arrived to ER.  Stat head CT shows large brainstem hemorrhage.    Significant Hospital Events   10/4 admitted, CVC placed, removed due to malposition, made DNR by palliative care 10/5 Palliative care [place] meeting- Cont DNR, treat treatable, "address need for trach/feeding tube when needed" 10/6 PM Brady->Asystole. ROSC after 2 rounds of CPR 1 epi. Full code per patient. See Xu 10/6 1826 note.  Consults:  Neuro NSGY  Procedures:  N/a  Interim history/subjective:   Remains critically ill. Intubated on life support. Sodium high. Giving free water back   Objective   Blood pressure 131/71, pulse 78, temperature 99.6 F (37.6 C), temperature source Axillary, resp. rate (!) 22, height '5\' 4"'$  (1.626 m), weight 82.3 kg, last menstrual period 12/23/2013, SpO2 99 %.    Vent Mode: PRVC FiO2 (%):  [40 %-50 %] 40 % Set Rate:  [18 bmp] 18 bmp Vt Set:  [430 mL] 430 mL PEEP:  [5 cmH20] 5 cmH20 Plateau Pressure:  [21 cmH20-22 cmH20] 22 cmH20   Intake/Output Summary (Last 24 hours) at 07/11/2021 0936 Last data filed at 07/11/2021 0700 Gross per 24 hour  Intake 2881.18 ml  Output 1340 ml  Net 1541.18 ml   Filed Weights   07/05/2021 0700 07/10/21 0414 07/11/21 0500  Weight: 78 kg 78 kg 82.3 kg    Examination:  General: critically ill fm, intubated on life support  HEENT: NCAT, tracking  Neuro: opens eyes  follows some commands, right plegia, I couldn't get any movement on left  Chest: BL vented breaths  Heart: RRR, s1 s2  Abdomen: soft nt nd  Skin: no rash   Resolved Hospital Problem list   Demand cardiac ischemia 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 infusion  Assessment & Plan:   Acute pontine/midbrain intraparenchymal hemorrhage in the setting of hypertensive emergency, ICH score of 4 Small IVH Cerebral edema Hypertensive emergency Acute encephalopathy due to hemorrhagic stroke Iatrogenic hypernatremia P: Neuro checks Hypertonic held Giving free water  Per neuro team  Avoiding sedation  RASS -1 Don't restart any hypotonic solution   S/p PEA cardiac arrest P: Support care post-arrest   Acute respiratory failure secondary hemorrhage acute stroke Aspiration pneumonia COVID 19, incidental P: Complete unasyn  LTVV I suspect will plan for trach next week  Complete unasyn  Anemia of chronic disease Follow H&H  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 No indication for dialysis at this time   Coffee-ground emesis likely due to upper GI bleeding H&H stable  Continue to observe   Best practice:   Best Practice (right click and "Reselect all SmartList Selections" daily)   Diet/type: Tube feeds DVT prophylaxis: SCD GI prophylaxis: PPI Central venous access:  N/A Foley:  N/A Code Status:  full code Last date of multidisciplinary goals of care discussion [10/6: Palliative care, neurology and CCM had meeting with family, they would like to keep patient on  full code   This patient is critically ill with multiple organ system failure; which, requires frequent high complexity decision making, assessment, support, evaluation, and titration of therapies. This was completed through the application of advanced monitoring technologies and extensive interpretation of multiple databases. During this encounter critical care time was devoted to patient care services described in this note for  32 minutes.  Garner Nash, DO [facility] 07/11/2021 9:36 AM

## 2021-07-11 NOTE — Progress Notes (Addendum)
STROKE TEAM PROGRESS NOTE   INTERVAL HISTORY  S/p CPR by EMS ?PEA arrest  10/4 S/p CPR for bradycardia/asystole 10/6 INTERVAL HISTORY RN reports another transient brady episode earlier this am when [facility] lowered for patient care.  She remains intubated. Cleviprex weaned off a bit earlier. Cre up to 5.99. On Unasyn. NA 161.  She did tolerate  Head CT which showed stable hemorrhage, edema and mass effect. No hydro.  Palliative care following, may meet with family later today if they assemble. Son, [name], at bedside. We discussed her plan of care and answered his questions.   Vitals:   07/11/21 1130 07/11/21 1145 07/11/21 1159 07/11/21 1200  BP: 120/69 129/70  128/72  Pulse: 69 73  76  Resp: 20 (!) 25  (!) 22  Temp:   98.5 F (36.9 C)   TempSrc:   Axillary   SpO2: 98% 99%  98%  Weight:      Height:       CBC:  Recent Labs  Lab 07/10/2021 0549 07/27/2021 0556 07/08/21 1825 07/09/21 0522 07/10/21 0438  WBC 11.6*   < > 17.3* 11.6* 11.4*  NEUTROABS 6.8  --  11.9*  --   --   HGB 9.8*   < > 7.9* 7.6* 7.5*  HCT 30.6*   < > 26.0* 24.5* 25.2*  MCV 88.2   < > 91.9 91.4 93.0  PLT 198   < > 183 166 156   < > = values in this interval not displayed.   Basic Metabolic Panel:  Recent Labs  Lab 07/09/21 0522 07/09/21 0956 07/09/21 1725 07/09/21 2214 07/10/21 0438 07/10/21 1638 07/11/21 0405 07/11/21 1003  NA 163*   < > 164*   < > 161*   < > 161* 161*  K 4.0  --   --   --  3.8  --   --  3.8  CL >130*  --   --   --  >130*  --   --  129*  CO2 23  --   --   --  21*  --   --  20*  GLUCOSE 99  --   --   --  110*  --   --  106*  BUN 60*  --   --   --  68*  --   --  78*  CREATININE 5.58*  --   --   --  5.99*  --   --  6.30*  CALCIUM 9.1  --   --   --  8.7*  --   --  8.5*  MG 2.2  --  2.3  --   --   --   --   --   PHOS 6.4*  --  4.8*  --   --   --   --  5.5*   < > = values in this interval not displayed.   Lipid Panel:  Recent Labs  Lab 07/08/21 0354 07/11/21 0405  CHOL 157  --    TRIG 349* 101  HDL 37*  --   CHOLHDL 4.2  --   VLDL 70*  --   LDLCALC 50  --    HgbA1c:  Recent Labs  Lab 07/08/21 0354  HGBA1C 5.1   Urine Drug Screen:  Recent Labs  Lab 07/13/2021 0912  LABOPIA NONE DETECTED  COCAINSCRNUR NONE DETECTED  LABBENZ NONE DETECTED  AMPHETMU NONE DETECTED  THCU NONE DETECTED  LABBARB NONE DETECTED    Alcohol Level No  results for input(s): ETH in the last 168 hours.  PHYSICAL EXAM [name] is intubated. General - Well nourished, well developed, intubated off sedation.   Cardiovascular - Regular rate and rhythm.   Neuro - intubated off sedation, eyes open, Not following commands today on the left. With eye opening, eyes in mid position, not blinking to visual threat bilaterally, able to have downward gaze bilaterally, and partial upward gaze bilaterally, however no movement horizontally, pupil right 1.5 mm and left 43m, nonreactive to light.  Corneal reflex absent bilaterally, gag and cough present. Breathing over the vent.  Facial symmetry not able to test due to ET tube.  Tongue protrusion not cooperative.  Left upper extremity proximal 0/5 but bicep 3-/5 barely against gravity, finger grip 3/5.  Left lower extremity proximal 2/5, distal toe DF/PF 3+/5.  Right UE flaccid proximally but finger grip 2/5, and right LE slight withdraw to pain stimulation, no movement of toes. DTR diminished and no babinski. Sensation, coordination not corporative and gait not tested.   ASSESSMENT/PLAN Ms. MDANIYAH CRAGLEis a 55y.o. 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 Unchanged surrounding edema and mass effect with partial effacement of the fourth ventricle. As before, there is probable small-volume extension of hemorrhage into the fourth ventricle. No evidence of hydrocephalus at this time. Stable mild-to-moderate cerebral white matter chronic small vessel ischemic disease. 2D Echo EF 60 to 65% in 01/2021 LDL 50 HgbA1c 5.1 UDS negative SCDs for VTE prophylaxis No antithrombotic prior to admission, now on No antithrombotic due to IArpinObtain Stat HCT for any neurologic decline  Therapy recommendations: Pending Disposition: Pending    Cerebral edema CT showed focal mass-effect at the brainstem On 3% saline @ 50 ->  off Sodium 143->148->152->151->159->161->162->161->161 Na goal 150-155 Na monitoring On FW 100 Q6h, consider increase today    History of ICH 01/2021 admitted for headache and slurred speech as well as hypertensive emergency.  CT showed right small pontine ICH.  EF 60 to 65%.  LDL 69, A1c 5.3.  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 at baseline   Hypertensive emergency Cleviprex weaned off this morning,  transitioned to oral meds via tube.  Not using BB due to bradycardia episodes.  On hydralazine 100 Q8, and amlodipine 10 Added clonidine 0.3 TID 10/7, Isordil added 10/8.  Per discussion with Dr. KJohnney Oucan titrate isordil if needed.  BP goal less than 160 Long term BP goal normotensive This is fourth hospital visit since Sep 2021 for hypertensive emergency. Noncompliance with medications has been an ongoing issue.    Respiratory failure Possible aspiration pneumonia Intubated for airway protection  Still on ventilation CCM on board Leukocytosis WBC 11.6->13.6->17.3->11.6->11.3 On Rocephin   AKI on CKD Creatinine 3.86->5.00->5.31->5.27->5.99 CCM on board On IV  fluid Nephrology on board, not candidate for dialysis yet, but "not much reserve" dose meds for CrCl < 15.     COVID PCR positive incidentally  Isolation CCM on board  Anemia Hgb 9.1->7.9->7.6->7.5 Iron deficiency, discussed replacement  with pharmacist Mina Marble who is recommending IV iron.  Transfuse hgb less than 7  S/p cardiac arrest 10/6 Bradycardia episodes  Patient had episode of transient or systolic cardiac arrest [id number]. She continues to have some intermittent bradycardia which may be due to brainstem autonomic dysfunction related to her hemorrhage.  Dysphagia Has OG tube On TF and FW Consider cortrak Monday pending family decision making for goals of care    Tobacco abuse Current smoker Smoking cessation counseling will be provided  Goals of care Family may have meeting with palliative care today re: possible trach/PEG May require more formal meeting tomorrow    Other Stroke Risk Factors History of THC use, UDS negative this time   Other Active Problems  This patient was seen and evaluated with Dr. Reeves Forth. He directed the plan of care. He may make changes to this note as he deems appropriate.   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  Repeat head CT with some delay due to inability to lay down completed yesterday.  It appears to be stable with no worsening.  She has a poor prognosis.  I evaluated pt independently, reviewed imaging, chart, labs. Discussed and formulated plan with the APP. Please see APP note above for details.      This patient is critically ill due to respiratory distress, stroke and at significant risk of neurological worsening, death form heart failure, respiratory failure, recurrent stroke, bleeding from [facility], seizure, sepsis. This patient's care requires constant monitoring of vital signs, hemodynamics, respiratory and cardiac monitoring,  review of multiple databases, neurological assessment, discussion with family, other specialists and medical decision making of high complexity. I spent 35 minutes of neurocritical care time in the care of this patient.   Parmvir Boomer,MD  To contact Stroke Continuity provider, please refer to [email or web address] After hours, contact General Neurology

## 2021-07-12 LAB — CBC
HCT: 22.2 % — ABNORMAL LOW (ref 36.0–46.0)
HCT: 29.3 % — ABNORMAL LOW (ref 36.0–46.0)
Hemoglobin: 6.7 g/dL — CL (ref 12.0–15.0)
Hemoglobin: 9.2 g/dL — ABNORMAL LOW (ref 12.0–15.0)
MCH: 28 pg (ref 26.0–34.0)
MCH: 28.5 pg (ref 26.0–34.0)
MCHC: 30.2 g/dL (ref 30.0–36.0)
MCHC: 31.4 g/dL (ref 30.0–36.0)
MCV: 92.9 fL (ref 80.0–100.0)
MCV: 90.7 fL (ref 80.0–100.0)
Platelets: 128 10*3/uL — ABNORMAL LOW (ref 150–400)
Platelets: 147 K/uL — ABNORMAL LOW (ref 150–400)
RBC: 2.39 MIL/uL — ABNORMAL LOW (ref 3.87–5.11)
RBC: 3.23 MIL/uL — ABNORMAL LOW (ref 3.87–5.11)
RDW: 19.1 % — ABNORMAL HIGH (ref 11.5–15.5)
RDW: 17.2 % — ABNORMAL HIGH (ref 11.5–15.5)
WBC: 8.9 10*3/uL (ref 4.0–10.5)
WBC: 9.5 K/uL (ref 4.0–10.5)
nRBC: 1 % — ABNORMAL HIGH (ref 0.0–0.2)
nRBC: 1.2 % — ABNORMAL HIGH (ref 0.0–0.2)

## 2021-07-12 LAB — RENAL FUNCTION PANEL
Albumin: 2.1 g/dL — ABNORMAL LOW (ref 3.5–5.0)
Anion gap: 9 (ref 5–15)
BUN: 83 mg/dL — ABNORMAL HIGH (ref 6–20)
CO2: 22 mmol/L (ref 22–32)
Calcium: 8.6 mg/dL — ABNORMAL LOW (ref 8.9–10.3)
Chloride: 128 mmol/L — ABNORMAL HIGH (ref 98–111)
Creatinine, Ser: 6.25 mg/dL — ABNORMAL HIGH (ref 0.44–1.00)
GFR, Estimated: 7 mL/min — ABNORMAL LOW (ref >60)
Glucose, Bld: 143 mg/dL — ABNORMAL HIGH (ref 70–99)
Phosphorus: 6.5 mg/dL — ABNORMAL HIGH (ref 2.5–4.6)
Potassium: 4.3 mmol/L (ref 3.5–5.1)
Sodium: 159 mmol/L — ABNORMAL HIGH (ref 135–145)

## 2021-07-12 LAB — GLUCOSE, CAPILLARY
Glucose-Capillary: 110 mg/dL — ABNORMAL HIGH (ref 70–99)
Glucose-Capillary: 115 mg/dL — ABNORMAL HIGH (ref 70–99)
Glucose-Capillary: 126 mg/dL — ABNORMAL HIGH (ref 70–99)
Glucose-Capillary: 129 mg/dL — ABNORMAL HIGH (ref 70–99)
Glucose-Capillary: 132 mg/dL — ABNORMAL HIGH (ref 70–99)
Glucose-Capillary: 138 mg/dL — ABNORMAL HIGH (ref 70–99)

## 2021-07-12 LAB — SODIUM: Sodium: 158 mmol/L — ABNORMAL HIGH (ref 135–145)

## 2021-07-12 LAB — PREPARE RBC (CROSSMATCH)

## 2021-07-12 MED ORDER — SODIUM CHLORIDE 0.9% IV SOLUTION: Freq: Once | INTRAVENOUS | Status: DC

## 2021-07-12 NOTE — Progress Notes (Signed)
Daily Progress Note   Patient Name: Robin Navarro       Date: 07/12/2021 DOB: Aug 04, 1966  Age: 55 y.o. MRN#: [id number] Attending Physician: Stroke, Md, MD Primary Care Physician: Robin Pepper, MD Admit Date: 07/16/2021 Length of Stay: 6 days  Reason for Consultation/Follow-up: Establishing goals of care  HPI/Patient Profile:  55 y.o. 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 EMS responded with possible PEA arrest requiring CPR in the field for several minutes and noted profound hypertension. Required King airway in the field and converted to ETT in the ER. Stat head CT with large brainstem hemorrhage (approx 16 mL). PCCM, neurology, nephrology have all been consulted.   Noted AKI on CKD, seen by nephrology who is holding on HD given her current condition. Ongoing/planned neurology and neurosurgery evaluation.  Possible aspiration pneumonitis. Remains on the vent, PCCM feels suspected terminal brain bleed but time for outcomes. Repeat CT 07/10/21 with no significant change.   PMT was consulted for goals of care/family discussion given severe/likely terminal condition.  Subjective:   Subjective: Chart Reviewed. Updates received. Patient Assessed. Created space and opportunity for patient  and family to explore thoughts and feelings regarding current medical situation.  Today's Discussion: Initially I saw the patient at bedside with no family present.  The patient was not interactive or able to respond substantially to me.  I spoke with the nurse caring for the patient who states that generally she has been stable today.  There is a question of possible family meeting either today or tomorrow, but nothing definitively set.  ADDENDUM: I was called to the bedside later in the day by the nurse who indicated if family was there and requesting a family meeting with PCCM and palliative.  I responded,  discussed situation with PCCM and nephrology via secure chat and in person.  We went to the family conference room with the patient's son, Robin Navarro, and 2 additional relatives in person.  There were additionally 2-3 additional relatives participating via speaker phone.  We had an extensive discussion about the current clinical situation, prognosis, likelihood of no significant neurological recovery.  Discussed multiple concepts including risk versus benefit for further aggressive care, trach, PEG and the likely futility of any of these.  Also shared that nephrology feels the patient is not currently a hemodialysis candidate and with her current trajectory of worsening we kidney function would limit her life as well.  We allowed time for sharing of opinions and emotions.  Provided emotional and general support via therapeutic listening.  Answered all questions, addressed all concerns.  It was decided, in theory, that we would progress to comfort care.  We will allow approximately 24 to 48 hours for family and friends to visit with the patient.  There is question about CODE STATUS in the interim.  After telephone relatives wait in it was decided to keep the patient a full code for now.  However, the patient's son will make a final decision later this afternoon/early evening regarding CODE STATUS and timing of comfort care.  Review of Systems  Unable to perform ROS: Intubated   Objective:   Vital Signs:  BP (!) 141/94   Pulse 69   Temp (!) 97 F (36.1 C) (Axillary)   Resp 18   Ht '5\' 4"'$  (1.626 m)   Wt 82.3 kg Comment: 1 pillow,1 blanket, 1 sheet  LMP 12/23/2013   SpO2 98%  BMI 31.14 kg/m   Physical Exam: Physical Exam Vitals and nursing note reviewed.  Constitutional:      General: She is sleeping. She is not in acute distress.    Appearance: She is obese. She is ill-appearing.  HENT:     Head: Normocephalic and atraumatic.  Cardiovascular:     Rate and Rhythm: Normal rate and regular  rhythm.  Pulmonary:     Effort: No respiratory distress.     Breath sounds: No wheezing or rhonchi.  Abdominal:     General: Abdomen is flat.     Palpations: Abdomen is soft.  Skin:    General: Skin is warm and dry.  Neurological:     Mental Status: She is lethargic.     Comments: Less responsive then a few days ago    Palliative Assessment/Data: 10%   Assessment & Plan:   Impression: Present on Admission: . Brainstem hemorrhage ([place]) . ICH (intracerebral hemorrhage) ([place])  Very ill 54 year old female who has suffered a large brainstem bleed. No hernaition at this point, but remains at high risk. Question PEA arrest in the field. Repeat CT head around showed no further bleeding, persistent hematoma, question ventricular changes. Currently has regained some critical reflexes (gag, cough) and still without pupil reaction to light; See neurology full note for details. Has had some responsiveness development, but still high risk for worsening and mortality. Family aware of how sick she is. Indications that she would not want artificial prolongation of life without ability to interact or have quality. Family has switched her back to full code after code event. Regarding trach/feeding tube: After family meeting today when it was made clear that trach, PEG, other aggressive care is likely futile.  They seem to understand this and have accepted it. Son Robin Navarro seems to be struggling with the burden of decisions with multiple family members involved in discussion. He will need support and guidance, assistance with grounding family to try and prevent family dynamics issues Overall decision was made to progress toward comfort care.  Timing to be decided by the patient's son later today or tomorrow.  SUMMARY OF RECOMMENDATIONS   Remain full code per family wishes CODE STATUS may change later today Theoretically agreed to transition toward comfort care, timing to be decided later today Anticipate  shift to comfort care in the next 24 to 48 hours No escalation of care moving forward PMT will continue to follow  Code Status: Full code  Prognosis: Unable to determine  Discharge Planning: Anticipated Hospital Death  Discussed with: Medical team, nursing team, patient's family  Thank you for allowing [place] to participate in the care of Robin Navarro PMT will continue to support holistically.  Time Total: 165 min (2h 54m  Visit consisted of counseling and education dealing with the complex and emotionally intense issues of symptom management and palliative care in the setting of serious and potentially life-threatening illness. Greater than 50%  of this time was spent counseling and coordinating care related to the above assessment and plan.  EWalden Field NP Palliative Medicine Team  Team Phone # [phone](Nights/Weekends)  06/01/2021, 8:17 AM

## 2021-07-12 NOTE — Progress Notes (Signed)
STROKE TEAM PROGRESS NOTE    S/p CPR by EMS ?PEA arrest  10/4 S/p CPR for bradycardia/asystole 10/6 INTERVAL HISTORY She remains intubated off sedation. Cre continues to climb up to 6.30. Nephrology following. On Unasyn. NA 159.  Palliative care following, may meet with family later today if they are available per nursing report. Her nurse is at the bedside.  Patient is arousable and follows a few simple commands on the left side. Vitals:   07/12/21 0800 07/12/21 0830 07/12/21 0900 07/12/21 0906  BP: (!) 142/96  (!) 144/107   Pulse: 68 71 81   Resp: '19 19 19   '$ Temp:  (!) 97.3 F (36.3 C)    TempSrc:  Axillary    SpO2: 97% 98% 98% 98%  Weight:      Height:       CBC:  Recent Labs  Lab 07/13/2021 0549 07/18/2021 0556 07/08/21 1825 07/09/21 0522 07/10/21 0438 07/11/21 2217  WBC 11.6*   < > 17.3*   < > 11.4* 8.9  NEUTROABS 6.8  --  11.9*  --   --   --   HGB 9.8*   < > 7.9*   < > 7.5* 6.7*  HCT 30.6*   < > 26.0*   < > 25.2* 22.2*  MCV 88.2   < > 91.9   < > 93.0 92.9  PLT 198   < > 183   < > 156 128*   < > = values in this interval not displayed.   Basic Metabolic Panel:  Recent Labs  Lab 07/09/21 0522 07/09/21 0956 07/09/21 1725 07/09/21 2214 07/11/21 1003 07/11/21 1641 07/11/21 2217  NA 163*   < > 164*   < > 161* 162* 159*  159*  K 4.0  --   --    < > 3.8  --  4.3  CL >130*  --   --    < > 129*  --  128*  CO2 23  --   --    < > 20*  --  22  GLUCOSE 99  --   --    < > 106*  --  143*  BUN 60*  --   --    < > 78*  --  83*  CREATININE 5.58*  --   --    < > 6.30*  --  6.25*  CALCIUM 9.1  --   --    < > 8.5*  --  8.6*  MG 2.2  --  2.3  --   --   --   --   PHOS 6.4*  --  4.8*  --  5.5*  --  6.5*   < > = values in this interval not displayed.   Lipid Panel:  Recent Labs  Lab 07/08/21 0354 07/11/21 0405  CHOL 157  --   TRIG 349* 101  HDL 37*  --   CHOLHDL 4.2  --   VLDL 70*  --   LDLCALC 50  --    HgbA1c:  Recent Labs  Lab 07/08/21 0354  HGBA1C 5.1    Urine Drug Screen:  Recent Labs  Lab 07/15/2021 0912  LABOPIA NONE DETECTED  COCAINSCRNUR NONE DETECTED  LABBENZ NONE DETECTED  AMPHETMU NONE DETECTED  THCU NONE DETECTED  LABBARB NONE DETECTED    Alcohol Level No results for input(s): ETH in the last 168 hours.  PHYSICAL EXAM [name] is intubated. General - Well nourished, well developed, intubated off  sedation.   Cardiovascular - Regular rate and rhythm.   Neuro - intubated off sedation, eyes open,   following a few commands today on the left. With eye opening, eyes in mid position, not blinking to visual threat bilaterally, able to have downward gaze bilaterally, and partial upward gaze bilaterally, however no movement horizontally, pupil right 1.5 mm and left 13m, nonreactive to light.  Corneal reflex absent bilaterally, gag and cough present. Breathing over the vent.  Facial symmetry not able to test due to ET tube.  Tongue protrusion not cooperative.  Left upper extremity proximal 0/5 but bicep 3-/5 barely against gravity, finger grip 3/5.  Left lower extremity proximal 2/5, distal toe DF/PF 3+/5.  Right UE flaccid proximally but finger grip 2/5, and right LE slight withdraw to pain stimulation, no movement of toes. DTR diminished and no babinski. Sensation, coordination not corporative and gait not tested.   ASSESSMENT/PLAN Ms. Robin KAWAGUCHIis a 55y.o. 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 Unchanged surrounding edema and mass effect with partial effacement of the fourth ventricle. As before, there is probable small-volume extension of hemorrhage into the fourth ventricle. No evidence of hydrocephalus at this time. Stable mild-to-moderate cerebral white matter chronic small vessel ischemic disease. 2D Echo EF 60 to 65% in 01/2021 LDL 50 HgbA1c 5.1 UDS negative SCDs for VTE prophylaxis No antithrombotic prior to admission, now on No antithrombotic due to [place] HCT for any neurologic decline  Therapy recommendations: Pending Disposition: Pending    Cerebral edema CT showed focal mass-effect at the brainstem On 3% saline @ 50 ->  off Sodium 143->148->152->151->159->161->162->161->161->159 Na goal 150-155 Na monitoring On FW 100 Q6h, consider increase today    History of ICH 01/2021 admitted for headache and slurred speech as well as hypertensive emergency.  CT showed right small pontine ICH.  EF 60 to 65%.  LDL 69, A1c 5.3.  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 at baseline   Hypertensive emergency Cleviprex weaned off this morning,  transitioned to oral meds via tube.  Not using BB  due to bradycardia episodes.  On hydralazine 100 Q8, and amlodipine 10 Added clonidine 0.3 TID 10/7, Isordil added 10/8.  Per discussion with Dr. KJohnney Oucan titrate isordil if needed.  BP goal less than 160 Long term BP goal normotensive This is fourth hospital visit since Sep 2021 for hypertensive emergency. Noncompliance with medications has been an ongoing issue.    Respiratory failure Possible aspiration pneumonia Intubated for airway protection  Still on ventilation CCM on board Leukocytosis WBC 11.6->13.6->17.3->11.6->11.3 On Rocephin   AKI on CKD Creatinine 3.86->5.00->5.31->5.27->5.99->6.30 CCM on board On IV fluid Nephrology on board, not candidate for dialysis currently dose meds for CrCl < 15.     COVID PCR positive incidentally  Isolation CCM  on board  Anemia Iron deficiency Hgb 9.1->7.9->7.6->7.5->6.7 Iron deficiency, IV replacement x 4 doses underway Transfuse hgb less than per nephrology, received on unit PRBCs overnight   S/p cardiac arrest 10/6 Bradycardia episodes  Patient had episode of transient or systolic cardiac arrest [id number] and 10/6. She continues to have some intermittent bradycardia which may be due to brainstem autonomic dysfunction related to her hemorrhage.  Dysphagia Has OG tube On TF and FW Consider cortrak Monday pending family decision making for goals of care    Tobacco abuse Current smoker Smoking cessation counseling will be provided  Goals of care Palliative care following, possible family meeting re: possible trach/PEG   Other Stroke Risk Factors History of THC use, UDS negative this time   Other Active Problems  This patient was seen and evaluated with Dr. Leonie Man. He directed the plan of care. He may make changes to this note as he deems appropriate.   Robin Brooke, NP-C   STROKE MD NOTE :  Patient's neurological exam remains poor and with her hemodynamic instability and large brainstem hemorrhage chances of survival without prolonged life support and trach and PEG and nursing home care negligible.  Given worsening renal function and poor general condition she is may not be a good candidate for dialysis.  Family to meet with palliative care team and critical care team today to discuss goals of care.  Discussed with Dr. Silas Flood critical care medicine and palliative care team.This patient is critically ill and at significant risk of neurological worsening, death and care requires constant monitoring of vital signs, hemodynamics,respiratory and cardiac monitoring, extensive review of multiple databases, frequent neurological assessment, discussion with family, other specialists and medical decision making of high complexity.I have made any additions or clarifications directly to the above  note.This critical care time does not reflect procedure time, or teaching time or supervisory time of PA/NP/Med Resident etc but could involve care discussion time.  I spent 30 minutes of neurocritical care time  in the care of  this patient.  Antony Contras MD

## 2021-07-12 NOTE — Progress Notes (Signed)
NAME:  Robin Navarro, MRN:  [id number], DOB:  Dec 20, 1965, LOS: 53 ADMISSION DATE:  07/11/2021, CONSULTATION DATE:  07/04/2021 REFERRING MD:  Darl Householder, CHIEF COMPLAINT:  Slurred speech   Brief History   Large brainstem hemorrhage Incidental COVID +  History of present illness   55 year old woman w/ hx of kidney disease, HTN, previous pontine hemorrhage presenting with slurred speech that started around 4 AM.  When EMS arrived there was a question of PEA arrest and patient received CPR for several minutes however her initial SBP was 300?Marland Kitchen  King airway placed in field for coma which was exchanged for ETT when arrived to ER.  Stat head CT shows large brainstem hemorrhage.    Significant Hospital Events   10/4 admitted, CVC placed, removed due to malposition, made DNR by palliative care 10/5 Palliative care [place] meeting- Cont DNR, treat treatable, "address need for trach/feeding tube when needed" 10/6 PM [place]->Asystole. ROSC after 2 rounds of CPR 1 epi. Full code per patient. See Xu 10/6 1826 note.  Consults:  Neuro NSGY  Procedures:  N/a  Interim history/subjective:  Hgb 6.7, receiving 2 units PRBC per primary team RN reports patient with pauses when laying flat.  Sodium trending down Creatinine plateau   Objective   Blood pressure 136/90, pulse 71, temperature (!) 97.3 F (36.3 C), temperature source Axillary, resp. rate 19, height '5\' 4"'$  (1.626 m), weight 82.3 kg, last menstrual period 12/23/2013, SpO2 98 %.    Vent Mode: PRVC FiO2 (%):  [40 %] 40 % Set Rate:  [18 bmp] 18 bmp Vt Set:  [430 mL] 430 mL PEEP:  [5 cmH20] 5 cmH20 Plateau Pressure:  [18 cmH20-30 cmH20] 23 cmH20   Intake/Output Summary (Last 24 hours) at 07/12/2021 0900 Last data filed at 07/12/2021 0745 Gross per 24 hour  Intake 3123.02 ml  Output 1311 ml  Net 1812.02 ml    Filed Weights   07/27/2021 0700 07/10/21 0414 07/11/21 0500  Weight: 78 kg 78 kg 82.3 kg    Examination:  General: Adult female,  intubated without sedation  HEENT: [place]/AT  Neuro: opens eyes to voice, follows some commands on left. Unable to lift left arm/leg fully off bed. Right hemiplegia.  Chest: Symmetric expansion, minimal secretions via ETT, minimal vent settings.  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  Holding hypertonic and on FWF.  Do not restart hypotonic solution. Sodium trending down -Continue neuro checks -Hold sedation. RASS goal -1  Hypertension -Hold BB in setting of bradycardia and pauses -Goal SBP < 160 -Cleviprex off 10/9 -Norvasc, hydralazine, isordil for blood pressure control.   S/p PEA cardiac arrest -Supportive care   Acute respiratory failure secondary hemorrhage acute stroke Aspiration pneumonia COVID 19, incidental -Unasyn D4 -Minimal vent settings, Isolation precautions in place for COVID +.  -Continue full vent support with lung protective strategy, concern for ability to protect airway.  Goals of care discussions are ongoing and family considering trach.  BUN/Creatinine elevated and would like to discuss if patient would be a candidate for dialysis in the future as this may change  discussions of care  Anemia of chronic disease -Transfusing PRBC this am for hgb < 7.  No bleeding on exam, no bloody output from NGT. Discussed with bedside RN, no concern for active bleeding. Repeat CBC Post transfusions.   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 No current indication for  dialysis  Coffee-ground emesis likely due to upper GI bleeding -PPI  Best practice:   Best Practice (right click and "Reselect all SmartList Selections" daily)   Diet/type: Tube feeds DVT prophylaxis: SCD GI prophylaxis: PPI Central venous access:  N/A Foley:  N/A Code Status:  full code Last date of multidisciplinary goals of care discussion Palliative care following and pending goals of care meetings  This patient is critically ill with multiple organ system failure; which, requires frequent high complexity decision making, assessment, support, evaluation, and titration of therapies. This was completed through the application of advanced monitoring technologies and extensive interpretation of multiple databases. During this encounter critical care time was devoted to patient care services described in this note for 33 minutes.  Minda Ditto, ACNP [facility] 07/12/2021 9:00 AM

## 2021-07-12 NOTE — Progress Notes (Signed)
Patient ID: Robin Navarro, female   DOB: [birth year], 55 y.o.   MRN: [id number] [facility] Progress Note   Assessment/ Plan:   1. Acute kidney Injury on chronic kidney disease stage IV: Nonoliguric and with renal function that is essentially unchanged overnight.  She does not have any laboratory volume indications for hemodialysis at this time.  The utility of dialysis at this time is minimal given the overall neurological insult and degree of interventions required that would essentially relegate her to long-term acute care.  Her ability to tolerate long-term dialysis depends on her extent of neurological injury and ability to come off of the ventilator. 2.  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  Continue supportive management with close monitoring per neurology recommendations.  Unfortunately, with guarded outlook. 3.  Hypernatremia: Therapeutic to help mitigate increased intracranial pressure by osmotic shifts.  We will continue to follow this and defer goals to neurology. 4.  Anemia: Without overt blood loss, status post PRBC transfusion earlier today. 5.  Acute respiratory failure: Secondary to hemorrhagic CVA.  With incidentally discovered COVID-19 infection.  On ventilator via endotracheal tube and on Unasyn coverage.  Subjective:   No acute events noted overnight-PRBC transfusion ordered earlier.   Objective:   BP 136/90   Pulse 73   Temp (!) 97 F (36.1 C) (Axillary)   Resp 19   Ht _0  (1.626 m)   Wt 82.3 kg Comment: 1 pillow,1 blanket, 1 sheet  LMP 12/23/2013   SpO2 97%   BMI 31.14 kg/m   Intake/Output Summary (Last 24 hours) at 07/12/2021 1039 Last data filed at 07/12/2021 1030 Gross per 24 hour  Intake 3199.48 ml  Output 1311 ml  Net 1888.48 ml   Weight  change:   Physical Exam: Gen: Intubated, opens her eyes to calling out her name but not able to track. CVS: Pulse regular rhythm, normal rate, S1 and S2 normal Resp: Anteriorly clear to auscultation, no rales/rhonchi Abd: Soft, obese, nontender, bowel sounds normal Ext: Trace lower extremity edema  Imaging: CT HEAD WO CONTRAST (5MM)  Result Date: 07/10/2021 CLINICAL DATA:  Neuro deficit, acute, stroke suspected. EXAM: CT HEAD WITHOUT CONTRAST TECHNIQUE: Contiguous axial images were obtained from the base of the skull through the vertex without intravenous contrast. COMPARISON:  Prior head CT examinations 07/25/2021 and earlier. FINDINGS: Brain: Cerebral volume is normal. A an acute parenchymal hemorrhage within the pons and midbrain has not significantly changed in size as compared to the prior head CT of 07/05/2021. Unchanged surrounding edema and mass effect with partial effacement the fourth ventricle. As before, there is small volume extension of hemorrhage into the fourth ventricle. No evidence of hydrocephalus at this time. Mild-to-moderate patchy and ill-defined hypoattenuation within the cerebral white matter, nonspecific but compatible with chronic small vessel ischemic disease. Vascular: No hyperdense vessel.  Atherosclerotic calcifications. Skull: Normal. Negative for fracture or focal lesion. Sinuses/Orbits: Visualized orbits show no acute finding. Chronic medially displaced fracture deformities of the lamina papyracea bilaterally. Scattered mild paranasal sinus mucosal thickening. Small volume frothy secretions within the right sphenoid sinus. Other: Trace fluid within the bilateral mastoid air cells. IMPRESSION: Acute parenchymal hemorrhage within the pons and midbrain, not significant changed as compared to the head CT of 07/15/2021. Unchanged surrounding edema and mass effect with partial effacement of the fourth ventricle. As before, there is probable small-volume  extension of hemorrhage  into the fourth ventricle. No evidence of hydrocephalus at this time. Stable mild-to-moderate cerebral white matter chronic small vessel ischemic disease. Paranasal sinus disease, as described. Electronically Signed   By: Kellie Simmering D.O.   On: 07/10/2021 12:32    Labs: BMET Recent Labs  Lab 07/07/21 1013 07/07/21 1558 07/08/21 0354 07/08/21 7482 07/08/21 1148 07/08/21 1742 07/08/21 1825 07/08/21 2216 07/09/21 0522 07/09/21 0956 07/09/21 1725 07/09/21 2214 07/10/21 0438 07/10/21 1638 07/10/21 2142 07/11/21 0405 07/11/21 1003 07/11/21 1641 07/11/21 2217  NA 151*   < > 161*   < >  --  163* 162*   < > 163*   < > 164*   < > 161* 160* 162* 161* 161* 162* 159*  159*  K 4.1  --  4.1  --   --   --  3.8  --  4.0  --   --   --  3.8  --   --   --  3.8  --  4.3  CL 120*  --  >130*  --   --   --  >130*  --  >130*  --   --   --  >130*  --   --   --  129*  --  128*  CO2 17*  --  18*  --   --   --  21*  --  23  --   --   --  21*  --   --   --  20*  --  22  GLUCOSE 140*  --  111*  --   --   --  158*  --  99  --   --   --  110*  --   --   --  106*  --  143*  BUN 66*  --  58*  --   --   --  57*  --  60*  --   --   --  68*  --   --   --  78*  --  83*  CREATININE 5.31*  --  5.27*  --   --   --  5.54*  --  5.58*  --   --   --  5.99*  --   --   --  6.30*  --  6.25*  CALCIUM 9.1  --  9.6  --   --   --  9.3  --  9.1  --   --   --  8.7*  --   --   --  8.5*  --  8.6*  PHOS  --   --  4.2  --  4.4 6.0*  --   --  6.4*  --  4.8*  --   --   --   --   --  5.5*  --  6.5*   < > = values in this interval not displayed.   CBC Recent Labs  Lab 07/17/2021 0549 07/23/2021 0556 07/08/21 1825 07/09/21 0522 07/10/21 0438 07/11/21 2217  WBC 11.6*   < > 17.3* 11.6* 11.4* 8.9  NEUTROABS 6.8  --  11.9*  --   --   --   HGB 9.8*   < > 7.9* 7.6* 7.5* 6.7*  HCT 30.6*   < > 26.0* 24.5* 25.2* 22.2*  MCV 88.2   < > 91.9 91.4 93.0 92.9  PLT 198   < > 183 166 156 128*   < > = values in this  interval not displayed.     Medications:     sodium chloride   Intravenous Once   amLODipine  10 mg Per Tube Daily   chlorhexidine gluconate (MEDLINE KIT)  15 mL Mouth Rinse BID   Chlorhexidine Gluconate Cloth  6 each Topical Daily   cloNIDine  0.3 mg Per Tube TID   [START ON 07/15/2021] ferrous sulfate  220 mg Per Tube Q breakfast   free water  300 mL Per Tube Q6H   hydrALAZINE  100 mg Per Tube TID   insulin aspart  0-9 Units Subcutaneous Q4H   isosorbide dinitrate  40 mg Per Tube TID   mouth rinse  15 mL Mouth Rinse 10 times per day   pantoprazole (PROTONIX) IV  40 mg Intravenous BID   senna-docusate  1 tablet Per Tube BID      Elmarie Shiley, MD 07/12/2021, 10:39 AM

## 2021-07-12 NOTE — Progress Notes (Signed)
   07/12/21 1900  Clinical Encounter Type  Visited With Family;Health care provider  Visit Type Follow-up;Psychological support;Spiritual support;Social support;Critical Care  Referral From Family;Nurse  Consult/Referral To Chaplain  Spiritual Encounters  Spiritual Needs Prayer;Emotional;Grief support  Stress Factors  Family Stress Factors Loss of control;Major life changes;Family relationships;Health changes    [facility] received PG to provide support to pt.'s son [name] at bedside; [facility] met [name] in conference room.  In extended conversation, [name] shared that he is pt.'s MPOA and has been wrestling with decision of whether to pursue DNR and comfort care for pt. or continue current interventions.  [name] is confident that pt. would not want to be kept alive in the condition she is in now and would not want to be a "burden" to anyone, and this impression was confirmed by one of pt.'s close friends in a phone conversation earlier today.  [name] says he feels "peace" in believing he would be following pt.'s wishes by stopping aggressive treatment and allowing her to pass "with dignity."  He anticipates pushback from family as he plans to discuss plan via phone conference tonight but seems resolved in the decision to pursue DNR/comfort care.  [name] expressed the desire to pursue grief counseling after pt.'s eventual death and requested resources; [facility] wrote down info for [facility]'s grief support programs for [name].  [place] offered prayer for [name] and pt. and family at end of visit; chaplains will continue to follow to provide support as able and remain available via page.  Lindaann Pascal, Chaplain Pager: [phone]

## 2021-07-12 NOTE — Progress Notes (Addendum)
Critical Hgb of 6.7. This is an indication for transfusion.   Patient unable to provide consent. Family unavailable by telephone.   From a benefits/risks and ethical standpoint, transfusion indicated without availability of patient/family consent as she is at risk of acute clinical deterioration without this treatment.   Order for 2 U PRBC transfusiion placed. Discussed with RN.   Electronically signed: Dr. Kerney Elbe

## 2021-07-13 LAB — BASIC METABOLIC PANEL
Anion gap: 10 (ref 5–15)
BUN: 92 mg/dL — ABNORMAL HIGH (ref 6–20)
CO2: 23 mmol/L (ref 22–32)
Calcium: 8.8 mg/dL — ABNORMAL LOW (ref 8.9–10.3)
Chloride: 125 mmol/L — ABNORMAL HIGH (ref 98–111)
Creatinine, Ser: 5.76 mg/dL — ABNORMAL HIGH (ref 0.44–1.00)
GFR, Estimated: 8 mL/min — ABNORMAL LOW (ref >60)
Glucose, Bld: 136 mg/dL — ABNORMAL HIGH (ref 70–99)
Potassium: 4.5 mmol/L (ref 3.5–5.1)
Sodium: 158 mmol/L — ABNORMAL HIGH (ref 135–145)

## 2021-07-13 LAB — SODIUM
Sodium: 156 mmol/L — ABNORMAL HIGH (ref 135–145)
Sodium: 157 mmol/L — ABNORMAL HIGH (ref 135–145)
Sodium: 157 mmol/L — ABNORMAL HIGH (ref 135–145)

## 2021-07-13 LAB — TYPE AND SCREEN
ABO/RH(D): O POS
Antibody Screen: NEGATIVE
Unit division: 0
Unit division: 0

## 2021-07-13 LAB — GLUCOSE, CAPILLARY
Glucose-Capillary: 118 mg/dL — ABNORMAL HIGH (ref 70–99)
Glucose-Capillary: 128 mg/dL — ABNORMAL HIGH (ref 70–99)
Glucose-Capillary: 130 mg/dL — ABNORMAL HIGH (ref 70–99)
Glucose-Capillary: 133 mg/dL — ABNORMAL HIGH (ref 70–99)
Glucose-Capillary: 133 mg/dL — ABNORMAL HIGH (ref 70–99)
Glucose-Capillary: 135 mg/dL — ABNORMAL HIGH (ref 70–99)
Glucose-Capillary: 146 mg/dL — ABNORMAL HIGH (ref 70–99)

## 2021-07-13 LAB — BPAM RBC
Blood Product Expiration Date: 202211022359
Blood Product Expiration Date: 202211032359
ISSUE DATE / TIME: 202210100604
ISSUE DATE / TIME: 202210100823
Unit Type and Rh: 5100
Unit Type and Rh: 5100

## 2021-07-13 LAB — MAGNESIUM: Magnesium: 2.7 mg/dL — ABNORMAL HIGH (ref 1.7–2.4)

## 2021-07-13 MED FILL — Medication: Qty: 1 | Status: AC

## 2021-07-13 NOTE — Progress Notes (Addendum)
STROKE TEAM PROGRESS NOTE      INTERVAL HISTORY She remains intubated off sedation. Kidney disease stabilizing now at creatinine at 5.76; nephrology consulting.   Palliative care met wit family members yesterday, and summary of outcome reveals patient remained full code per family wishes with anticipation to transition to comfort care in the next 24 to 48 hours.  Her nurse is at the bedside.  Patient is more sleepy today and difficult to arouse not following commands.  Vital signs stable.  Vitals:   07/13/21 1000 07/13/21 1100 07/13/21 1115 07/13/21 1200  BP: (!) 145/90 (!) 141/92 (!) 141/92 (!) 149/99  Pulse: 74 65  70  Resp: 16 14  16   Temp:    98.1 F (36.7 C)  TempSrc:    Axillary  SpO2: 98% 98% 98% 98%  Weight:      Height:       CBC:  Recent Labs  Lab 07/08/21 1825 07/09/21 0522 07/11/21 2217 07/12/21 1821  WBC 17.3*   < > 8.9 9.5  NEUTROABS 11.9*  --   --   --   HGB 7.9*   < > 6.7* 9.2*  HCT 26.0*   < > 22.2* 29.3*  MCV 91.9   < > 92.9 90.7  PLT 183   < > 128* 147*   < > = values in this interval not displayed.    Basic Metabolic Panel:  Recent Labs  Lab 07/09/21 1725 07/09/21 2214 07/11/21 1003 07/11/21 1641 07/11/21 2217 07/12/21 1821 07/13/21 0037 07/13/21 0900  NA 164*   < > 161*   < > 159*  159*   < > 156* 158*  K  --    < > 3.8  --  4.3  --   --  4.5  CL  --    < > 129*  --  128*  --   --  125*  CO2  --    < > 20*  --  22  --   --  23  GLUCOSE  --    < > 106*  --  143*  --   --  136*  BUN  --    < > 78*  --  83*  --   --  92*  CREATININE  --    < > 6.30*  --  6.25*  --   --  5.76*  CALCIUM  --    < > 8.5*  --  8.6*  --   --  8.8*  MG 2.3  --   --   --   --   --   --  2.7*  PHOS 4.8*  --  5.5*  --  6.5*  --   --   --    < > = values in this interval not displayed.    Lipid Panel:  Recent Labs  Lab 07/08/21 0354 07/11/21 0405  CHOL 157  --   TRIG 349* 101  HDL 37*  --   CHOLHDL 4.2  --   VLDL 70*  --   LDLCALC 50  --      HgbA1c:  Recent Labs  Lab 07/08/21 0354  HGBA1C 5.1    Urine Drug Screen:  No results for input(s): LABOPIA, COCAINSCRNUR, LABBENZ, AMPHETMU, THCU, LABBARB in the last 168 hours.   Alcohol Level No results for input(s): ETH in the last 168 hours.  PHYSICAL EXAM [name] is intubated. General - Well nourished, well developed, intubated off  sedation.   Cardiovascular - Regular rate and rhythm.   Neuro - intubated off sedation, stuporous.  Eyes closed.  Partially opens eyes to sternal rub., eyes in mid position, not blinking to visual threat bilaterally, pupil right 1.5 mm and left 70m, nonreactive to light.  Corneal reflex absent bilaterally, gag and cough present. Breathing over the vent.  Facial symmetry not able to test due to ET tube.  Tongue protrusion not cooperative.  Able to move the left upper and lower extremity.  Purposefully to painful stimulus.  Right UE flaccid with only slight withdraw to pain stimulation, no movement of toes. DTR diminished and no babinski. Sensation, coordination not corporative and gait not tested.   ASSESSMENT/PLAN Ms. MSHABANA ARMENTROUTis a 55y.o. 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 No tPA given due to [place]    Patient's neurological exam remains poor and with her hemodynamic instability and large brainstem hemorrhage chances of survival without prolonged life support and trach and PEG and nursing home care negligible.  Given worsening renal function and poor general condition she is may not be a good candidate for dialysis.  palliative care team and critical care team have already met with family to discuss goals of  care.   Discussed with Dr. HSilas Floodcritical care medicine and palliative care team.   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 Unchanged surrounding edema and mass effect with partial effacement of the fourth ventricle. As before, there is probable small-volume extension of hemorrhage into the fourth ventricle. No evidence of hydrocephalus at this time. Stable mild-to-moderate cerebral white matter chronic small vessel ischemic disease. 2D Echo EF 60 to 65% in 01/2021 LDL 50 HgbA1c 5.1 UDS negative SCDs for VTE prophylaxis No antithrombotic prior to admission, now on No antithrombotic due to [place] recommendations: Pending Disposition: Pending    Cerebral edema CT showed focal mass-effect at the brainstem On 3% saline @ 50 ->  off Sodium 143->148->152->151->159->161->162->161->161->159 Na goal 150-155 Na monitoring On FW 100 Q6h, consider increase today    History of ICH 01/2021 admitted for headache and slurred speech as well as hypertensive emergency.  CT showed right small pontine ICH.  EF 60 to 65%.  LDL 69, A1c 5.3.  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 at baseline   Hypertensive emergency Cleviprex weaned off this morning,  transitioned to oral meds via tube.  Not using BB  due to bradycardia episodes.  On hydralazine 100 Q8, and amlodipine 10 Added clonidine 0.3 TID 10/7, Isordil added 10/8.  Per discussion with Dr. KJohnney Oucan titrate isordil if needed.  BP goal systolic less than 1315Long term BP goal normotensive This is fourth hospital visit since Sep 2021 for hypertensive emergency. Noncompliance with medications has been an ongoing issue.    Respiratory failure Possible aspiration pneumonia Intubated for airway protection  Still on ventilation CCM on board Leukocytosis WBC 11.6->13.6->17.3->11.6->11.3 On Rocephin   AKI  on CKD Creatinine 3.86->5.00->5.31->5.27->5.99->6.30 CCM on board On IV fluid Nephrology on board, not candidate for dialysis currently dose meds for CrCl < 15.     COVID PCR positive incidentally  Isolation CCM on board  Anemia Iron deficiency Hgb 9.1->7.9->7.6->7.5->6.7 Iron deficiency, IV replacement x 4 doses underway Transfuse hgb less than per nephrology, received on unit PRBCs overnight   S/p cardiac arrest 10/6 Bradycardia episodes  Patient had episode of transient or systolic cardiac arrest 32/3 and 10/6. She continues to have some intermittent bradycardia which may be due to brainstem autonomic dysfunction related to her hemorrhage.  Dysphagia Has core track tube for feeding On TF and FW Will likely need PEG tube pending family decision making for goals of care    Tobacco abuse Current smoker Smoking cessation counseling will be provided  Goals of care Palliative care following, possible family meeting re: possible trach/PEG   Other Stroke Risk Factors History of THC use, UDS negative this time   Other Active Problems  This patient was seen and evaluated with Dr. Leonie Man. He directed the plan of care. He may make changes to this note as he deems appropriate.    I have personally obtained history,examined this patient, reviewed notes, independently viewed imaging studies, participated in medical decision making and plan of care.ROS completed by me personally and pertinent positives fully documented  I have made any additions or clarifications directly to the above note. Agree with note above.  Patient neurological exam is declined today in the setting of renal failure and nephrology and CCM both feel should not candidate for dialysis.  Family struggling with decision about comfort care and goals of care.  Appreciate help from palliative care team.  I agree she has a poor prognosis given likely brainstem hemorrhage she is unlikely to survive and live independently and is  likely going to need prolonged ventilatory support with tracheostomy and PEG tube and prolonged stay in nursing home if she survives.  Hopefully family medical decision soon.  Discussed with Dr. Silas Flood and Dr. Posey Pronto.  No family available at the bedside for discussion.  This patient is critically ill and at significant risk of neurological worsening, death and care requires constant monitoring of vital signs, hemodynamics,respiratory and cardiac monitoring, extensive review of multiple databases, frequent neurological assessment, discussion with family, other specialists and medical decision making of high complexity.I have made any additions or clarifications directly to the above note.This critical care time does not reflect procedure time, or teaching time or supervisory time of PA/NP/Med Resident etc but could involve care discussion time.  I spent 30 minutes of neurocritical care time  in the care of  this patient.      Antony Contras, MD Medical Director [facility] Pager: [phone] 07/13/2021 2:16 PM

## 2021-07-13 NOTE — Progress Notes (Addendum)
Patient ID: Robin Navarro, female   DOB: 11/28/1965, 55 y.o.   MRN: [id number] [facility] Progress Note   Assessment/ Plan:   1. Acute kidney Injury on chronic kidney disease stage IV: Nonoliguric and with renal function that is essentially unchanged overnight.  Labs pending from this morning to assess renal function-she is not a candidate for dialysis given its limited utility (improving labs without improving prognosis of what appears to be fairly catastrophic brain injury).  Trajectory noted to be heading towards comfort care. 2.  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  Poor outlook-supportive management per neurology recommendations. 3.  Hypernatremia: Therapeutic to help mitigate increased intracranial pressure by osmotic shifts.  We will continue to follow this and defer goals to neurology. 4.  Anemia: Without overt blood loss, status post PRBC transfusion earlier today. 5.  Acute respiratory failure: Secondary to hemorrhagic CVA.  With incidentally discovered COVID-19 infection.  Remains ventilator dependent and currently on Unasyn.  Subjective:   Bradycardia noted on telemetry overnight.   Objective:   BP (!) 144/97   Pulse 64   Temp (!) 97.2 F (36.2 C)   Resp 15   Ht 5' 4"  (1.626 m)   Wt 82.3 kg Comment: 1 pillow,1 blanket, 1 sheet  LMP 12/23/2013   SpO2 98%   BMI 31.14 kg/m   Intake/Output Summary (Last 24 hours) at 07/13/2021 0932 Last data filed at 07/13/2021 4854 Gross per 24 hour  Intake 1843.16 ml  Output 2150 ml  Net -306.84 ml   Weight change:   Physical Exam: Gen: Intubated, not able to follow commands-increased lethargy CVS: Pulse regular rhythm, normal rate, S1 and S2 normal Resp: Anteriorly clear to auscultation, no rales/rhonchi Abd: Soft,  obese, nontender, bowel sounds normal Ext: Trace lower extremity edema  Imaging: No results found.  Labs: BMET Recent Labs  Lab 07/07/21 1013 07/07/21 1558 07/08/21 0354 07/08/21 6270 07/08/21 1148 07/08/21 1742 07/08/21 1825 07/08/21 2216 07/09/21 0522 07/09/21 3500 07/09/21 1725 07/09/21 2214 07/10/21 0438 07/10/21 1638 07/10/21 2142 07/11/21 0405 07/11/21 1003 07/11/21 1641 07/11/21 2217 07/12/21 1821 07/13/21 0037  NA 151*   < > 161*   < >  --  163* 162*   < > 163*   < > 164*   < > 161*   < > 162* 161* 161* 162* 159*  159* 158* 156*  K 4.1  --  4.1  --   --   --  3.8  --  4.0  --   --   --  3.8  --   --   --  3.8  --  4.3  --   --   CL 120*  --  >130*  --   --   --  >130*  --  >130*  --   --   --  >130*  --   --   --  129*  --  128*  --   --   CO2 17*  --  18*  --   --   --  21*  --  23  --   --   --  21*  --   --   --  20*  --  22  --   --   GLUCOSE 140*  --  111*  --   --   --  158*  --  99  --   --   --  110*  --   --   --  106*  --  143*  --   --   BUN 66*  --  58*  --   --   --  57*  --  60*  --   --   --  68*  --   --   --  78*  --  83*  --   --   CREATININE 5.31*  --  5.27*  --   --   --  5.54*  --  5.58*  --   --   --  5.99*  --   --   --  6.30*  --  6.25*  --   --   CALCIUM 9.1  --  9.6  --   --   --  9.3  --  9.1  --   --   --  8.7*  --   --   --  8.5*  --  8.6*  --   --   PHOS  --   --  4.2  --  4.4 6.0*  --   --  6.4*  --  4.8*  --   --   --   --   --  5.5*  --  6.5*  --   --    < > = values in this interval not displayed.   CBC Recent Labs  Lab 07/08/21 1825 07/09/21 0522 07/10/21 0438 07/11/21 2217 07/12/21 1821  WBC 17.3* 11.6* 11.4* 8.9 9.5  NEUTROABS 11.9*  --   --   --   --   HGB 7.9* 7.6* 7.5* 6.7* 9.2*  HCT 26.0* 24.5* 25.2* 22.2* 29.3*  MCV 91.9 91.4 93.0 92.9 90.7  PLT 183 166 156 128* 147*    Medications:     sodium chloride   Intravenous Once   amLODipine  10 mg Per Tube Daily   chlorhexidine gluconate (MEDLINE KIT)  15 mL  Mouth Rinse BID   Chlorhexidine Gluconate Cloth  6 each Topical Daily   cloNIDine  0.3 mg Per Tube TID   [START ON 07/15/2021] ferrous sulfate  220 mg Per Tube Q breakfast   free water  300 mL Per Tube Q6H   hydrALAZINE  100 mg Per Tube TID   insulin aspart  0-9 Units Subcutaneous Q4H   isosorbide dinitrate  40 mg Per Tube TID   mouth rinse  15 mL Mouth Rinse 10 times per day   pantoprazole (PROTONIX) IV  40 mg Intravenous BID   senna-docusate  1 tablet Per Tube BID      Elmarie Shiley, MD 07/13/2021, 9:32 AM

## 2021-07-13 NOTE — Progress Notes (Addendum)
NAME:  Robin Navarro, MRN:  [id number], DOB:  03-Jul-1966, LOS: 32 ADMISSION DATE:  07/04/2021, CONSULTATION DATE:  07/21/2021 REFERRING MD:  Darl Householder, CHIEF COMPLAINT:  Slurred speech   Brief History   Large brainstem hemorrhage Incidental COVID +  History of present illness   55 year old woman w/ hx of kidney disease, HTN, previous pontine hemorrhage presenting with slurred speech that started around 4 AM.  When EMS arrived there was a question of PEA arrest and patient received CPR for several minutes however her initial SBP was 300?Marland Kitchen  King airway placed in field for coma which was exchanged for ETT when arrived to ER.  Stat head CT shows large brainstem hemorrhage.    Significant Hospital Events   10/4 admitted, CVC placed, removed due to malposition, made DNR by palliative care 10/5 Palliative care [place] meeting- Cont DNR, treat treatable, "address need for trach/feeding tube when needed" 10/6 PM Brady->Asystole. ROSC after 2 rounds of CPR 1 epi. Full code per patient. See Xu 10/6 1826 note. 10/10>> HGB 6.7, received 2 units PRBC, family goals of care conference  Consults:  Neuro NSGY  Procedures:  N/a  Interim history/subjective:  Hgb 6.7, 07/12/2021>> received 2 units PRBC, up to 9.2  on 10/11, platelets are 147, WBC 9.109mT Max 97.5 Na 156 HR in 60's overnight , with one episode of bradycardia overnight  Sodium continues to slowly down trend, 156 on 10/11. Creatinine last evaluated 10/9 was 6.25, pending this morning. Net + 8.5 L,  2150 UO last 24 hours   Objective   Blood pressure (!) 146/93, pulse 63, temperature (!) 97.2 F (36.2 C), resp. rate 16, height '5\' 4"'$  (1.626 m), weight 82.3 kg, last menstrual period 12/23/2013, SpO2 98 %.    Vent Mode: PSV;CPAP FiO2 (%):  [40 %] 40 % Set Rate:  [18 bmp] 18 bmp Vt Set:  [430 mL] 430 mL PEEP:  [5 cmH20] 5 cmH20 Pressure Support:  [10 cmH20] 10 cmH20 Plateau Pressure:  [19 cmH20-27 cmH20] 19 cmH20   Intake/Output Summary (Last  24 hours) at 07/13/2021 0846 Last data filed at 07/13/2021 0519 Gross per 24 hour  Intake 1903.16 ml  Output 2150 ml  Net -246.84 ml   Filed Weights   07/29/2021 0700 07/10/21 0414 07/11/21 0500  Weight: 78 kg 78 kg 82.3 kg    Examination:  General: Adult female, intubated not sedated HEENT: [place]/AT , ETT secure and intact, OG with TF infusing Neuro: opens eyes to voice, follows some commands on left. Unable to lift left arm/leg fully off bed. Right hemiplegia.  Chest: Bilateral chest excursion, secretions  noted via via ETT, Currently on CPAP/ PS  Heart: RRR, s1 s2, + edema Abdomen: soft nt nd , BS +, TF infusing per OG Skin: no rash , no lesions   Resolved Hospital Problem list   Demand cardiac ischemia 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 infusion  Assessment & Plan:   Acute pontine/midbrain intraparenchymal hemorrhage in the setting of hypertensive emergency ICH score of 4 Small IVH Cerebral edema Hypertensive emergency Iatrogenic hypernatremia Toxic Metabolic encephalopathy -Neurology following -Left sided weakness at baseline from previous [place], but following come simple commands with L -Na goal > 150, currently meeting.  Holding hypertonic and on FWF.  Do not restart hypotonic solution. Sodium trending down -Continue neuro checks -Hold sedation. RASS goal -1  Hypertension/ Bradycardia  -Hold BB in setting of bradycardia and pauses -Goal SBP < 160 -Cleviprex off 10/9 -Norvasc, hydralazine,  isordil for blood pressure control.  - Trend Mag prn with goal > 2   S/p PEA cardiac arrest -Supportive care   Acute respiratory failure secondary hemorrhage acute stroke Aspiration pneumonia COVID 19, incidental CPAP PS at intervals -Unasyn D5 -Minimal vent settings, Isolation precautions in place for COVID +.  -Continue full vent support with lung protective strategy, concern for ability to protect airway.  - CPAP/ PS as tolerated  - Goals of care  discussions are ongoing and family considering trach.  BUN/Creatinine elevated and would like to discuss if patient would be a candidate for dialysis in the future as this may change discussions of care  Anemia of chronic disease -Transfusing PRBC  10/10  for hgb < 7.  No bleeding on exam, . No signs of  active bleeding.  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 No current indication for dialysis - Trend BMET - Replete electrolytes as needed  Coffee-ground emesis likely due to upper GI bleeding - Continue PPI  Best practice:   Best Practice (right click and "Reselect all SmartList Selections" daily)   Diet/type: Tube feeds DVT prophylaxis: SCD GI prophylaxis: PPI Central venous access:  N/A Foley:  N/A Code Status:  full code Last date of multidisciplinary goals of care discussion Palliative care following and pending goals of care meetings  This patient is critically ill with multiple organ system failure; which, requires frequent high complexity decision making, assessment, support, evaluation, and titration of therapies. This was completed through the application of advanced monitoring technologies and extensive interpretation of multiple databases. During this encounter critical care time was devoted to patient care services described in this note for 35 minutes.  Magdalen Spatz, ACNP [facility] 07/13/2021 8:46 AM    Critical care attending attestation note:  Patient seen and examined and relevant ancillary tests reviewed.  I agree with the assessment and plan of care as outlined by Paulita Fujita, NP.    55 year old with devastating pontine bleed. ICH score 4. Worsening level of alertness on vent. Likely terminal event. Long discussion with family yesterday. Multiple people present state she would not want to live on machines. Discussed her expected life if were to continue. Not a  HD candidate given neurologic devastation. Therefore not trach candidate. Patient would not want these things. Recommended DNR and transition to comfort care once family arrives.    Synopsis of assessment and plan:   Pontine/Midbrain bleed: Due to HTN. worsening exam.  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  Critical care was necessary to treat or prevent imminent or life-threatening deterioration.  Critical care was time spent personally by me on the following activities: development of treatment plan with patient and/or surrogate as well as nursing, discussions with consultants, evaluation of patient's response to  treatment, examination of patient, obtaining history from patient or surrogate, ordering and performing treatments and interventions, ordering and review of laboratory studies, ordering and review of radiographic studies, pulse oximetry, re-evaluation of patient's condition and participation in multidisciplinary rounds.  Lanier Clam, MD See Amion for contact info  07/13/2021, 10:32 AM

## 2021-07-13 NOTE — Progress Notes (Signed)
Daily Progress Note   Patient Name: Robin Navarro       Date: 07/13/2021 DOB: September 30, 1966  Age: 55 y.o. MRN#: [id number] Attending Physician: Stroke, Md, MD Primary Care Physician: [name], MD Admit Date: 07/31/2021 Length of Stay: 7 days  Reason for Consultation/Follow-up: Establishing goals of care  HPI/Patient Profile:  55 y.o. female  with past medical history of htn, CKD, previous small pontine bleed 01/2021 admitted on 07/18/2021 with slurred speech since 4:00 am. EMS responded with possible PEA arrest requiring CPR in the field for several minutes and noted profound hypertension. Required King airway in the field and converted to ETT in the ER. Stat head CT with large brainstem hemorrhage (approx 16 mL). PCCM, neurology, nephrology have all been consulted.   Noted AKI on CKD, seen by nephrology who is holding on HD given her current condition. Ongoing/planned neurology and neurosurgery evaluation.  Possible aspiration pneumonitis. Remains on the vent, PCCM feels suspected terminal brain bleed but time for outcomes. Repeat CT 07/10/21 with no significant change.   PMT was consulted for goals of care/family discussion given severe/likely terminal condition.  Subjective:   Subjective: Chart Reviewed. Updates received. Patient Assessed. Created space and opportunity for patient  and family to explore thoughts and feelings regarding current medical situation.  Today's Discussion: I examined patient at her bedside.  She is much less responsive today, not following simple commands.  She did open her eyes when her name was called repeatedly.  However, no tracking.  I spoke with nursing staff who indicated that the patient's son would be by sometime today.  He indicated he would message me to let me know when her son arrived.  Anticipating decision on CODE STATUS and comfort care within the next 24 hours.  Based on family meeting yesterday it appears that they are leaning toward comfort care  after extensive discussion allowing him to recognize her unlikely chance of significant neurological recovery given multiple chronic comorbidities, acute illnesses.  Review of Systems  Unable to perform ROS: Intubated   Objective:   Vital Signs:  BP (!) 144/97   Pulse 64   Temp (!) 97.2 F (36.2 C)   Resp 15   Ht '5\' 4"'$  (1.626 m)   Wt 82.3 kg Comment: 1 pillow,1 blanket, 1 sheet  LMP 12/23/2013   SpO2 98%   BMI 31.14 kg/m   Physical Exam: Physical Exam Vitals and nursing note reviewed.  Constitutional:      General: She is sleeping. She is not in acute distress.    Appearance: She is ill-appearing.  HENT:     Head: Normocephalic and atraumatic.  Cardiovascular:     Rate and Rhythm: Normal rate.  Pulmonary:     Effort: Pulmonary effort is normal. No respiratory distress.  Abdominal:     General: Abdomen is flat.     Palpations: Abdomen is soft.  Neurological:     Comments: Minimally responsive, no purposeful movements    Palliative Assessment/Data: 20%   Assessment & Plan:   Impression: Present on Admission: . Brainstem hemorrhage ([place]) . ICH (intracerebral hemorrhage) ([place])  Very ill 55 year old female who has suffered a large brainstem bleed. No hernaition at this point, but remains at high risk. Question PEA arrest in the field. Repeat CT head around showed no further bleeding, persistent hematoma, question ventricular changes. Currently has regained some critical reflexes (gag, cough) and still without pupil reaction to light; See neurology full note for details. Has had some responsiveness  development, but still high risk for worsening and mortality. Family aware of how sick she is. Indications that she would not want artificial prolongation of life without ability to interact or have quality. Family has switched her back to full code after code event. Regarding trach/feeding tube: After family meeting today when it was made clear that trach, PEG, other aggressive  care is likely futile.  They seem to understand this and have accepted it. Son [name] seems to be struggling with the burden of decisions with multiple family members involved in discussion. He will need support and guidance, assistance with grounding family to try and prevent family dynamics issues Overall decision was made to progress toward comfort care.  Awaiting final decision on DNR and timing for transition to comfort care.  SUMMARY OF RECOMMENDATIONS   Remain full code per family wishes Await family decision on CODE STATUS Theoretically agreed to transition toward comfort care, timing to be decided Anticipate shift to comfort care in the next 24 to 48 hours No escalation of care moving forward PMT will continue to follow  Code Status: Full code  Prognosis: Unable to determine  Discharge Planning: To Be Determined  Discussed with: 35 min  Thank you for allowing [place] to participate in the care of AMARYLIS DONAN PMT will continue to support holistically.  Time Total: Medical team, nursing team  Visit consisted of counseling and education dealing with the complex and emotionally intense issues of symptom management and palliative care in the setting of serious and potentially life-threatening illness. Greater than 50%  of this time was spent counseling and coordinating care related to the above assessment and plan.  Walden Field, NP Palliative Medicine Team  Team Phone # [phone] (Nights/Weekends)  06/01/2021, 8:17 AM

## 2021-07-14 DIAGNOSIS — I619 Nontraumatic intracerebral hemorrhage, unspecified: Secondary | ICD-10-CM

## 2021-07-14 LAB — GLUCOSE, CAPILLARY
Glucose-Capillary: 126 mg/dL — ABNORMAL HIGH (ref 70–99)
Glucose-Capillary: 147 mg/dL — ABNORMAL HIGH (ref 70–99)
Glucose-Capillary: 128 mg/dL — ABNORMAL HIGH (ref 70–99)
Glucose-Capillary: 136 mg/dL — ABNORMAL HIGH (ref 70–99)
Glucose-Capillary: 109 mg/dL — ABNORMAL HIGH (ref 70–99)

## 2021-07-14 LAB — CBC
HCT: 29.8 % — ABNORMAL LOW (ref 36.0–46.0)
Hemoglobin: 9.3 g/dL — ABNORMAL LOW (ref 12.0–15.0)
MCH: 28.9 pg (ref 26.0–34.0)
MCHC: 31.2 g/dL (ref 30.0–36.0)
MCV: 92.5 fL (ref 80.0–100.0)
Platelets: 158 10*3/uL (ref 150–400)
RBC: 3.22 MIL/uL — ABNORMAL LOW (ref 3.87–5.11)
RDW: 17.7 % — ABNORMAL HIGH (ref 11.5–15.5)
WBC: 8.3 10*3/uL (ref 4.0–10.5)
nRBC: 1.9 % — ABNORMAL HIGH (ref 0.0–0.2)

## 2021-07-14 LAB — BASIC METABOLIC PANEL
Anion gap: 11 (ref 5–15)
BUN: 100 mg/dL — ABNORMAL HIGH (ref 6–20)
CO2: 18 mmol/L — ABNORMAL LOW (ref 22–32)
Calcium: 8.7 mg/dL — ABNORMAL LOW (ref 8.9–10.3)
Chloride: 126 mmol/L — ABNORMAL HIGH (ref 98–111)
Creatinine, Ser: 5.62 mg/dL — ABNORMAL HIGH (ref 0.44–1.00)
GFR, Estimated: 8 mL/min — ABNORMAL LOW (ref >60)
Glucose, Bld: 121 mg/dL — ABNORMAL HIGH (ref 70–99)
Potassium: 4.8 mmol/L (ref 3.5–5.1)
Sodium: 155 mmol/L — ABNORMAL HIGH (ref 135–145)

## 2021-07-14 LAB — SODIUM
Sodium: 157 mmol/L — ABNORMAL HIGH (ref 135–145)
Sodium: 154 mmol/L — ABNORMAL HIGH (ref 135–145)
Sodium: 156 mmol/L — ABNORMAL HIGH (ref 135–145)

## 2021-07-14 NOTE — Progress Notes (Signed)
Patient ID: Robin Navarro, female   DOB: [birth year], 55 y.o.   MRN: [id number] Trajectory of care moving towards transition to comfort care/palliative care measures after suffering a devastating neurological injury from pontine hemorrhage. I would not recommend hemodialysis as this will not impact her overall clinical status in a positive way.   Renal service will continue to remain available for questions or concerns.   Elmarie Shiley MD [facility]. Office # [phone] Pager # [phone] 11:58 AM

## 2021-07-14 NOTE — Progress Notes (Signed)
Daily Progress Note   Patient Name: Robin Navarro       Date: 07/14/2021 DOB: 1965-12-28  Age: 55 y.o. MRN#: [id number] Attending Physician: Stroke, Md, MD Primary Care Physician: [name], MD Admit Date: 07/21/2021 Length of Stay: 8 days  Reason for Consultation/Follow-up: Establishing goals of care  HPI/Patient Profile:  55 y.o. 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 EMS responded with possible PEA arrest requiring CPR in the field for several minutes and noted profound hypertension. Required King airway in the field and converted to ETT in the ER. Stat head CT with large brainstem hemorrhage (approx 16 mL). PCCM, neurology, nephrology have all been consulted.   Noted AKI on CKD, seen by nephrology who is holding on HD given her current condition. Ongoing/planned neurology and neurosurgery evaluation.  Possible aspiration pneumonitis. Remains on the vent, PCCM feels suspected terminal brain bleed but time for outcomes. Repeat CT 07/10/21 with no significant change.   PMT was consulted for goals of care/family discussion given severe/likely terminal condition.  Subjective:   Subjective: Chart Reviewed. Updates received. Patient Assessed. Created space and opportunity for patient  and family to explore thoughts and feelings regarding current medical situation.  Today's Discussion: Patient is not meaningfully responsive at this time. Spoke with [name] (son) and her cousin at the bedside. Updated that the patient is stable, but some declining responsiveness. [name] states that his grandmother (the patient's mom) is arriving tomorrow and would like to see the patient 1 final time before they make final decisions.  He states that they are leaning towards making the patient a DNR and comfort care with liberation from the ventilator and "allow God to take her in his time."  We confirmed, again,  that the patient would not want to be in her current state.  He states he asked her if she wants to be a DNR and she shook her head yes.  He knows that this is not definitive, but it helps confirm to him that he is doing the right thing.  Anticipate final decisions to be made tomorrow after family visits.  Discussed care with JD (PCCM PA) and Dr. Silas Flood.  They appreciated the patient likely has no meaningful recovery.  Dr. Silas Flood specifically informed the patient's son that there may come a time where continued ventilatory support, especially with no offered hemodialysis, would be futile at which point aggressive care would be discontinued.  He seemed to verbalize understanding of this.  Hopefully, the family will make their decision tomorrow before the medical team needs to intervene.  Review of Systems  Unable to perform ROS: Intubated   Objective:   Vital Signs:  BP 134/81   Pulse 76   Temp (!) 96.4 F (35.8 C) (Axillary) Comment: warm blankets applied  Resp 19   Ht '5\' 4"'$  (1.626 m)   Wt 85.7 kg   LMP 12/23/2013   SpO2 98%   BMI 32.43 kg/m   Physical Exam: Physical Exam Vitals and nursing note reviewed.  Constitutional:      General: She is sleeping. She is not in acute distress.    Appearance: She is ill-appearing.     Comments: Minimally responsive today  HENT:     Head: Normocephalic and atraumatic.  Cardiovascular:     Rate and Rhythm: Normal rate.  Pulmonary:     Effort: Pulmonary effort is normal. No respiratory distress.  Breath sounds: No wheezing or rhonchi.  Abdominal:     General: Abdomen is flat.     Palpations: Abdomen is soft.  Skin:    General: Skin is warm and dry.  Neurological:     Mental Status: She is unresponsive.    Palliative Assessment/Data: 10%   Assessment & Plan:   Impression: Present on Admission: . Brainstem hemorrhage ([place]) . ICH (intracerebral hemorrhage) ([place])  Very ill 55 year old female who has suffered a large  brainstem bleed. No hernaition at this point, but remains at high risk. Question PEA arrest in the field. Repeat CT head around showed no further bleeding, persistent hematoma, question ventricular changes. Currently has regained some critical reflexes (gag, cough) and still without pupil reaction to light; See neurology full note for details. Has had some responsiveness development, but still high risk for worsening and mortality. Family aware of how sick she is. Indications that she would not want artificial prolongation of life without ability to interact or have quality. Family has switched her back to full code after code event. Regarding trach/feeding tube: After family meeting today when it was made clear that trach, PEG, other aggressive care is likely futile.  They seem to understand this and have accepted it. Son [name] seems to be struggling with the burden of decisions with multiple family members involved in discussion. He will need support and guidance, assistance with grounding family to try and prevent family dynamics issues Overall decision was made to progress toward comfort care.  Awaiting final decision on DNR and timing for transition to comfort care, likely tomorrow after the patient's mom has been able to visit again and for her to be there when DNR is made.  SUMMARY OF RECOMMENDATIONS   Remain full code per family wishes CODE STATUS may change tomorrow Theoretically agreed to transition toward comfort care, likely transition tomorrow Anticipate shift to comfort care tomorrow No escalation of care moving forward PMT will continue to follow  Code Status: Full code  Prognosis: Hours - Days  Discharge Planning: Anticipated Hospital Death  Discussed with: Medical team, nursing team, patient's family  Thank you for allowing [place] to participate in the care of KEYOKA KUNKLE PMT will continue to support holistically.  Time Total: 35 min  Visit consisted of counseling and  education dealing with the complex and emotionally intense issues of symptom management and palliative care in the setting of serious and potentially life-threatening illness. Greater than 50%  of this time was spent counseling and coordinating care related to the above assessment and plan.  Walden Field, NP Palliative Medicine Team  Team Phone # [phone] (Nights/Weekends)  06/01/2021, 8:17 AM

## 2021-07-14 NOTE — Progress Notes (Signed)
Nutrition Follow-up  DOCUMENTATION CODES:   Not applicable  INTERVENTION:   Tube feeding via OG tube: Vital AF 1.2 at 60 ml/h (1440 ml per day)  Provides 1728 kcal, 108 gm protein, 1168 ml free water daily  300 ml free water every 6 hours Total free water: 2368 ml   NUTRITION DIAGNOSIS:   Inadequate oral intake related to inability to eat as evidenced by NPO status. Ongoing.   GOAL:   Patient will meet greater than or equal to 90% of their needs Met.   MONITOR:   Vent status, Labs, Weight trends, TF tolerance, Skin, I & O's  REASON FOR ASSESSMENT:   Ventilator, Consult Enteral/tube feeding initiation and management  ASSESSMENT:   Pt with PMH of uncontrolled HTN admitted with large brainstem hemorrhage, incidental COVID+.  Per MD at rounds plan for transition to comfort after large bleed due to poor prognosis. Per renal pt is not a candidate for HD.    Patient is currently intubated on ventilator support MV: 8.1 L/min Temp (24hrs), Avg:97.7 F (36.5 C), Min:96.4 F (35.8 C), Max:99.6 F (37.6 C)   Medications reviewed and include: SSI, protonix, senokot-s  Labs reviewed: Na 157, BUN: 100, Cr 5.62 CBG's: 126-147   UOP: 2450 ml  16 F OG tube - stomach per xray    Diet Order:   Diet Order             Diet NPO time specified  Diet effective now                   EDUCATION NEEDS:   Not appropriate for education at this time  Skin:  Skin Assessment: (P)  (skin tear R hip)  Last BM:  10/10  Height:   Ht Readings from Last 1 Encounters:  07/13/21 5' 4"  (1.626 m)    Weight:   Wt Readings from Last 1 Encounters:  07/14/21 85.7 kg    Ideal Body Weight:  54.5 kg  BMI:  Body mass index is 32.43 kg/m.  Estimated Nutritional Needs:   Kcal:  1654  Protein:  100-115 grams  Fluid:  > 1.6 L  Alok Minshall P., RD, LDN, CNSC See AMiON for contact information

## 2021-07-14 NOTE — Progress Notes (Addendum)
STROKE TEAM PROGRESS NOTE      INTERVAL HISTORY She remains intubated for respiratory failure but off sedation.  Patient is more alert today and follows only occasional midline and left-sided commands.  She remains with dense right hemiplegia.  Family struggling with decision about comfort care and goals of care.  Appreciate help from palliative care team.  I agree she has a poor prognosis given likely brainstem hemorrhage she is unlikely to survive and live independently and is likely going to need prolonged ventilatory support with tracheostomy and PEG tube and prolonged stay in nursing home if she survives.  Family is meeting with palliative care team and likely to make decision on comfort care soon.  She however remains full code for now   Vital signs stable.  Vitals:   07/14/21 1000 07/14/21 1100 07/14/21 1118 07/14/21 1127  BP: 134/81 135/80  135/80  Pulse: 76 73    Resp: 19 19    Temp:   (!) 97.1 F (36.2 C)   TempSrc:   Axillary   SpO2: 98% 98%  98%  Weight:      Height:       CBC:  Recent Labs  Lab 07/08/21 1825 07/09/21 0522 07/12/21 1821 07/14/21 0945  WBC 17.3*   < > 9.5 8.3  NEUTROABS 11.9*  --   --   --   HGB 7.9*   < > 9.2* 9.3*  HCT 26.0*   < > 29.3* 29.8*  MCV 91.9   < > 90.7 92.5  PLT 183   < > 147* 158   < > = values in this interval not displayed.    Basic Metabolic Panel:  Recent Labs  Lab 07/09/21 1725 07/09/21 2214 07/11/21 1003 07/11/21 1641 07/11/21 2217 07/12/21 1821 07/13/21 0900 07/13/21 1610 07/14/21 0322 07/14/21 0945  NA 164*   < > 161*   < > 159*  159*   < > 158*   < > 155* 157*  K  --    < > 3.8  --  4.3  --  4.5  --  4.8  --   CL  --    < > 129*  --  128*  --  125*  --  126*  --   CO2  --    < > 20*  --  22  --  23  --  18*  --   GLUCOSE  --    < > 106*  --  143*  --  136*  --  121*  --   BUN  --    < > 78*  --  83*  --  92*  --  100*  --   CREATININE  --    < > 6.30*  --  6.25*  --  5.76*  --  5.62*  --   CALCIUM  --    <  > 8.5*  --  8.6*  --  8.8*  --  8.7*  --   MG 2.3  --   --   --   --   --  2.7*  --   --   --   PHOS 4.8*  --  5.5*  --  6.5*  --   --   --   --   --    < > = values in this interval not displayed.    Lipid Panel:  Recent Labs  Lab 07/08/21 0354 07/11/21 0405  CHOL 157  --  TRIG 349* 101  HDL 37*  --   CHOLHDL 4.2  --   VLDL 70*  --   LDLCALC 50  --     HgbA1c:  Recent Labs  Lab 07/08/21 0354  HGBA1C 5.1    Urine Drug Screen:  No results for input(s): LABOPIA, COCAINSCRNUR, LABBENZ, AMPHETMU, THCU, LABBARB in the last 168 hours.   Alcohol Level No results for input(s): ETH in the last 168 hours.  PHYSICAL EXAM [name] is intubated. General - Well nourished, well developed, intubated off sedation.   Cardiovascular - Regular rate and rhythm.   Neuro - intubated off sedation, awake.  Eyes open follows few midline and occasional commands on the left side., pupil right 1.5 mm and left 32m, nonreactive to light.  Corneal reflex absent bilaterally, gag and cough present. Breathing over the vent.  Facial symmetry not able to test due to ET tube.  Tongue protrusion not cooperative.  Able to move the left upper and lower extremity.  Purposefully to painful stimulus.  Right UE flaccid with only slight withdraw to pain stimulation, no movement of toes. DTR diminished and no babinski. Sensation, coordination not corporative and gait not tested.   ASSESSMENT/PLAN Ms. Robin Navarro a 56y.o. 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 No tPA given due to [place]    Patient's neurological exam remains poor and with her hemodynamic instability and large brainstem hemorrhage chances of survival without prolonged life support and trach and PEG and nursing home care negligible.  Given worsening renal function and poor general  condition she is may not be a good candidate for dialysis.  palliative care team and critical care team have already met with family to discuss goals of  care.   Discussed with Dr. HSilas Floodcritical care medicine and palliative care team.   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 Unchanged surrounding edema and mass effect with partial effacement of the fourth ventricle. As before, there is probable small-volume extension of hemorrhage into the fourth ventricle. No evidence of hydrocephalus at this time. Stable mild-to-moderate cerebral white matter chronic small vessel ischemic disease. 2D Echo EF 60 to 65% in 01/2021 LDL 50 HgbA1c 5.1 UDS negative SCDs for VTE prophylaxis No antithrombotic prior to admission, now on No antithrombotic due to [place] recommendations: Pending Disposition: Pending    Cerebral edema CT showed focal mass-effect at the brainstem On 3% saline @ 50 ->  off Sodium 143->148->152->151->159->161->162->161->161->159 Na goal 150-155 Na monitoring On FW 100 Q6h, consider increase today    History of ICH 01/2021 admitted for headache and slurred speech as well as hypertensive emergency.  CT showed right small pontine ICH.  EF 60 to 65%.  LDL 69, A1c 5.3.  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 at baseline   Hypertensive emergency Cleviprex weaned off this morning,  transitioned to oral meds via tube.  Not using BB  due to bradycardia episodes.  On hydralazine 100 Q8, and amlodipine 10 Added clonidine 0.3 TID 10/7, Isordil added 10/8.  Per discussion with Dr. KJohnney Oucan titrate isordil if needed.  BP goal systolic less  than 160 Long term BP goal  normotensive This is fourth hospital visit since Sep 2021 for hypertensive emergency. Noncompliance with medications has been an ongoing issue.    Respiratory failure Possible aspiration pneumonia Intubated for airway protection  Still on ventilation CCM on board Leukocytosis WBC 11.6->13.6->17.3->11.6->11.3 On Rocephin   AKI on CKD Creatinine 3.86->5.00->5.31->5.27->5.99->6.30 CCM on board On IV fluid Nephrology on board, not candidate for dialysis currently dose meds for CrCl < 15.     COVID PCR positive incidentally  Isolation CCM on board  Anemia Iron deficiency Hgb 9.1->7.9->7.6->7.5->6.7 Iron deficiency, IV replacement x 4 doses underway Transfuse hgb less than per nephrology, received on unit PRBCs overnight   S/p cardiac arrest 10/6 Bradycardia episodes  Patient had episode of transient or systolic cardiac arrest 09/3 and 10/6. She continues to have some intermittent bradycardia which may be due to brainstem autonomic dysfunction related to her hemorrhage.  Dysphagia Has core track tube for feeding On TF and FW Will likely need PEG tube pending family decision making for goals of care    Tobacco abuse Current smoker Smoking cessation counseling will be provided  Goals of care Palliative care following, possible family meeting re: possible trach/PEG   Other Stroke Risk Factors History of THC use, UDS negative this time   Other Active Problems  This patient was seen and evaluated with Dr. Leonie Man. He directed the plan of care. He may make changes to this note as he deems appropriate.    I have personally obtained history,examined this patient, reviewed notes, independently viewed imaging studies, participated in medical decision making and plan of care.ROS completed by me personally and pertinent positives fully documented  I have made any.  Or clarifications directly to the above note. Agree with note above.  Patient neurological exam remains poor and she is  unlikely to survive without prolonged ventilatory support, tracheostomy, PEG tube and nursing home care with 24-hour support.  Family struggling with decisions about comfort care hopefully will reach decision soon.  Appreciate palliative care team's help.  Discussed with Dr. Silas Flood critical care medicine and palliative care.This patient is critically ill and at significant risk of neurological worsening, death and care requires constant monitoring of vital signs, hemodynamics,respiratory and cardiac monitoring, extensive review of multiple databases, frequent neurological assessment, discussion with family, other specialists and medical decision making of high complexity.I have made any additions or clarifications directly to the above note.This critical care time does not reflect procedure time, or teaching time or supervisory time of PA/NP/Med Resident etc but could involve care discussion time.  I spent 30 minutes of neurocritical care time  in the care of  this patient.       Antony Contras, MD Medical Director [facility] Pager: [phone] 07/14/2021 4:19 PM

## 2021-07-14 NOTE — Progress Notes (Signed)
NAME:  Robin Navarro, MRN:  [id number], DOB:  10/22/1965, LOS: 8 ADMISSION DATE:  07/19/2021, CONSULTATION DATE:  07/30/2021 REFERRING MD:  Darl Householder, CHIEF COMPLAINT:  Slurred speech   Brief History   Large brainstem hemorrhage. Incidental COVID +  History of present illness   55 year old woman w/ hx of kidney disease, HTN, previous pontine hemorrhage presenting with slurred speech that started around 4 AM.  When EMS arrived there was a question of PEA arrest and patient received CPR for several minutes however her initial SBP was 300?Marland Kitchen  King airway placed in field for coma which was exchanged for ETT when arrived to ER.  Stat head CT shows large brainstem hemorrhage.    Significant Hospital Events   10/4 admitted, CVC placed, removed due to malposition, made DNR by palliative care 10/5 Palliative care [place] meeting- Cont DNR, treat treatable, "address need for trach/feeding tube when needed" 10/6 PM Brady->Asystole. ROSC after 2 rounds of CPR 1 epi. Full code per patient. See Xu 10/6 1826 note. 10/9: Cleviprex off 10/10>> HGB 6.7, received 2 units PRBC, family goals of care conference  Consults:  Neuro NSGY  Procedures:  CVL 10/4  Interim history/subjective:  UOP -2,450 Last 24 hours Glucose range 121-146 SBP range 140-153 overnight HR 60s overnight Na 155  Patient intubated on PRVC 40% Patient appears to have similar neuro exam to yesterday   Objective   Blood pressure (!) 140/92, pulse 62, temperature 97.6 F (36.4 C), temperature source Axillary, resp. rate 14, height '5\' 4"'$  (1.626 m), weight 85.7 kg, last menstrual period 12/23/2013, SpO2 98 %.    Vent Mode: PRVC FiO2 (%):  [40 %] 40 % Set Rate:  [18 bmp] 18 bmp Vt Set:  [430 mL] 430 mL PEEP:  [5 cmH20] 5 cmH20 Pressure Support:  [10 cmH20] 10 cmH20 Plateau Pressure:  [20 cmH20-23 cmH20] 23 cmH20   Intake/Output Summary (Last 24 hours) at 07/14/2021 0718 Last data filed at 07/14/2021 0600 Gross per 24 hour  Intake  3675.15 ml  Output 2450 ml  Net 1225.15 ml    Filed Weights   07/10/21 0414 07/11/21 0500 07/14/21 0500  Weight: 78 kg 82.3 kg 85.7 kg    Examination:  General: critically ill appearing on mech vent HEENT: MM pink/moist; ETT in place Neuro: opens eyes to voice; PERRL; 2 mm pupils b/l; gives thumbs up and wiggles toes on left; very weak on left side and not able to keep arm or leg up in air; No mvt on Right CV: s1s2, RRR, no m/r/g PULM:  dim clear BS bilaterally; on mech vent PRVC GI: soft, bsx4 active  Extremities: warm/dry, no edema  Skin: no rashes or lesions   Labs: Na 155, Glucose 121, BUN 100 (92), Creat 5.76 (5.62)   Resolved Hospital Problem list   Demand cardiac ischemia 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 infusion  Assessment & Plan:   Acute pontine/midbrain intraparenchymal hemorrhage in the setting of hypertensive emergency ICH score of 4 Small IVH Cerebral edema Hypertensive emergency Iatrogenic hypernatremia Toxic Metabolic encephalopathy P: -Neuro following -Na goal 150-155; continue to check sodium -continue free water -Frequent neuro exams -limit sedating meds for RASS goal -1 -SBP goal <160  Hypertension/ Bradycardia P: -SBP range 140-153; SBP goal <160 -continue norvasc, hydralazine, isordil, and clonidine -hold BB due to bradycardia; avoid Ace/Arb/HCTZ due to AKI -trend BMP/Mag   S/p PEA cardiac arrest P: -continue telemetry monitoring -supportive care  Acute respiratory failure secondary hemorrhage acute stroke  Aspiration pneumonia COVID 19, incidental CPAP PS at intervals P: -Continue Unasyn (day 6) -continue mech vent support with PRVC 6-8 cc/kg -wean fio2 for sats >92% -CXR in am -VAP prevention in place -PS trials as tolerated -continue Covid isolation precautions -continue [place] discussion; may need to consider trach   Anemia of chronic disease P: -trend CBC -transfuse for hgb <7  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 P: -nephrology following; no need for dialysis currently -UOP -2,450 last 24 hours -continue to trend UOP/BMP -Replace electrolytes as indicated -Avoid nephrotoxic agents, ensure adequate renal perfusion  Coffee-ground emesis likely due to upper GI bleeding P: - PPI   Best practice:   Best Practice (right click and "Reselect all SmartList Selections" daily)   Diet/type: Tube feeds DVT prophylaxis: SCD GI prophylaxis: PPI Central venous access:  N/A Foley:  N/A Code Status:  full code Last date of multidisciplinary goals of care discussion Palliative care following and pending goals of care meetings  Critical Care time: 35 minutes   JD Rexene Agent [facility] 07/14/2021, 7:49 AM  Please see [email or web address] for pager details.  From 7A-7P if no response, please call [phone]. After hours, please call ELink [phone].

## 2021-07-15 ENCOUNTER — Inpatient Hospital Stay (HOSPITAL_COMMUNITY): Payer: Medicaid Other

## 2021-07-15 LAB — GLUCOSE, CAPILLARY
Glucose-Capillary: 129 mg/dL — ABNORMAL HIGH (ref 70–99)
Glucose-Capillary: 93 mg/dL (ref 70–99)
Glucose-Capillary: 110 mg/dL — ABNORMAL HIGH (ref 70–99)
Glucose-Capillary: 122 mg/dL — ABNORMAL HIGH (ref 70–99)
Glucose-Capillary: 116 mg/dL — ABNORMAL HIGH (ref 70–99)
Glucose-Capillary: 124 mg/dL — ABNORMAL HIGH (ref 70–99)
Glucose-Capillary: 125 mg/dL — ABNORMAL HIGH (ref 70–99)

## 2021-07-15 LAB — BASIC METABOLIC PANEL
Anion gap: 12 (ref 5–15)
BUN: 100 mg/dL — ABNORMAL HIGH (ref 6–20)
CO2: 21 mmol/L — ABNORMAL LOW (ref 22–32)
Calcium: 9.2 mg/dL (ref 8.9–10.3)
Chloride: 123 mmol/L — ABNORMAL HIGH (ref 98–111)
Creatinine, Ser: 5.21 mg/dL — ABNORMAL HIGH (ref 0.44–1.00)
GFR, Estimated: 9 mL/min — ABNORMAL LOW (ref >60)
Glucose, Bld: 98 mg/dL (ref 70–99)
Potassium: 4.6 mmol/L (ref 3.5–5.1)
Sodium: 156 mmol/L — ABNORMAL HIGH (ref 135–145)

## 2021-07-15 LAB — CBC
HCT: 30.3 % — ABNORMAL LOW (ref 36.0–46.0)
Hemoglobin: 9.4 g/dL — ABNORMAL LOW (ref 12.0–15.0)
MCH: 28.8 pg (ref 26.0–34.0)
MCHC: 31 g/dL (ref 30.0–36.0)
MCV: 92.9 fL (ref 80.0–100.0)
Platelets: 166 10*3/uL (ref 150–400)
RBC: 3.26 MIL/uL — ABNORMAL LOW (ref 3.87–5.11)
RDW: 18 % — ABNORMAL HIGH (ref 11.5–15.5)
WBC: 9.3 10*3/uL (ref 4.0–10.5)
nRBC: 1 % — ABNORMAL HIGH (ref 0.0–0.2)

## 2021-07-15 LAB — SODIUM
Sodium: 156 mmol/L — ABNORMAL HIGH (ref 135–145)
Sodium: 155 mmol/L — ABNORMAL HIGH (ref 135–145)
Sodium: 154 mmol/L — ABNORMAL HIGH (ref 135–145)

## 2021-07-15 LAB — MAGNESIUM: Magnesium: 2.8 mg/dL — ABNORMAL HIGH (ref 1.7–2.4)

## 2021-07-15 IMAGING — DX DG CHEST 1V PORT
1 series · 1 of 1 positions shown · non-contrast
Comparison: [DATE]

CLINICAL DATA: Acute respiratory failure with hypoxia

EXAM:
PORTABLE CHEST 1 VIEW

[chest]
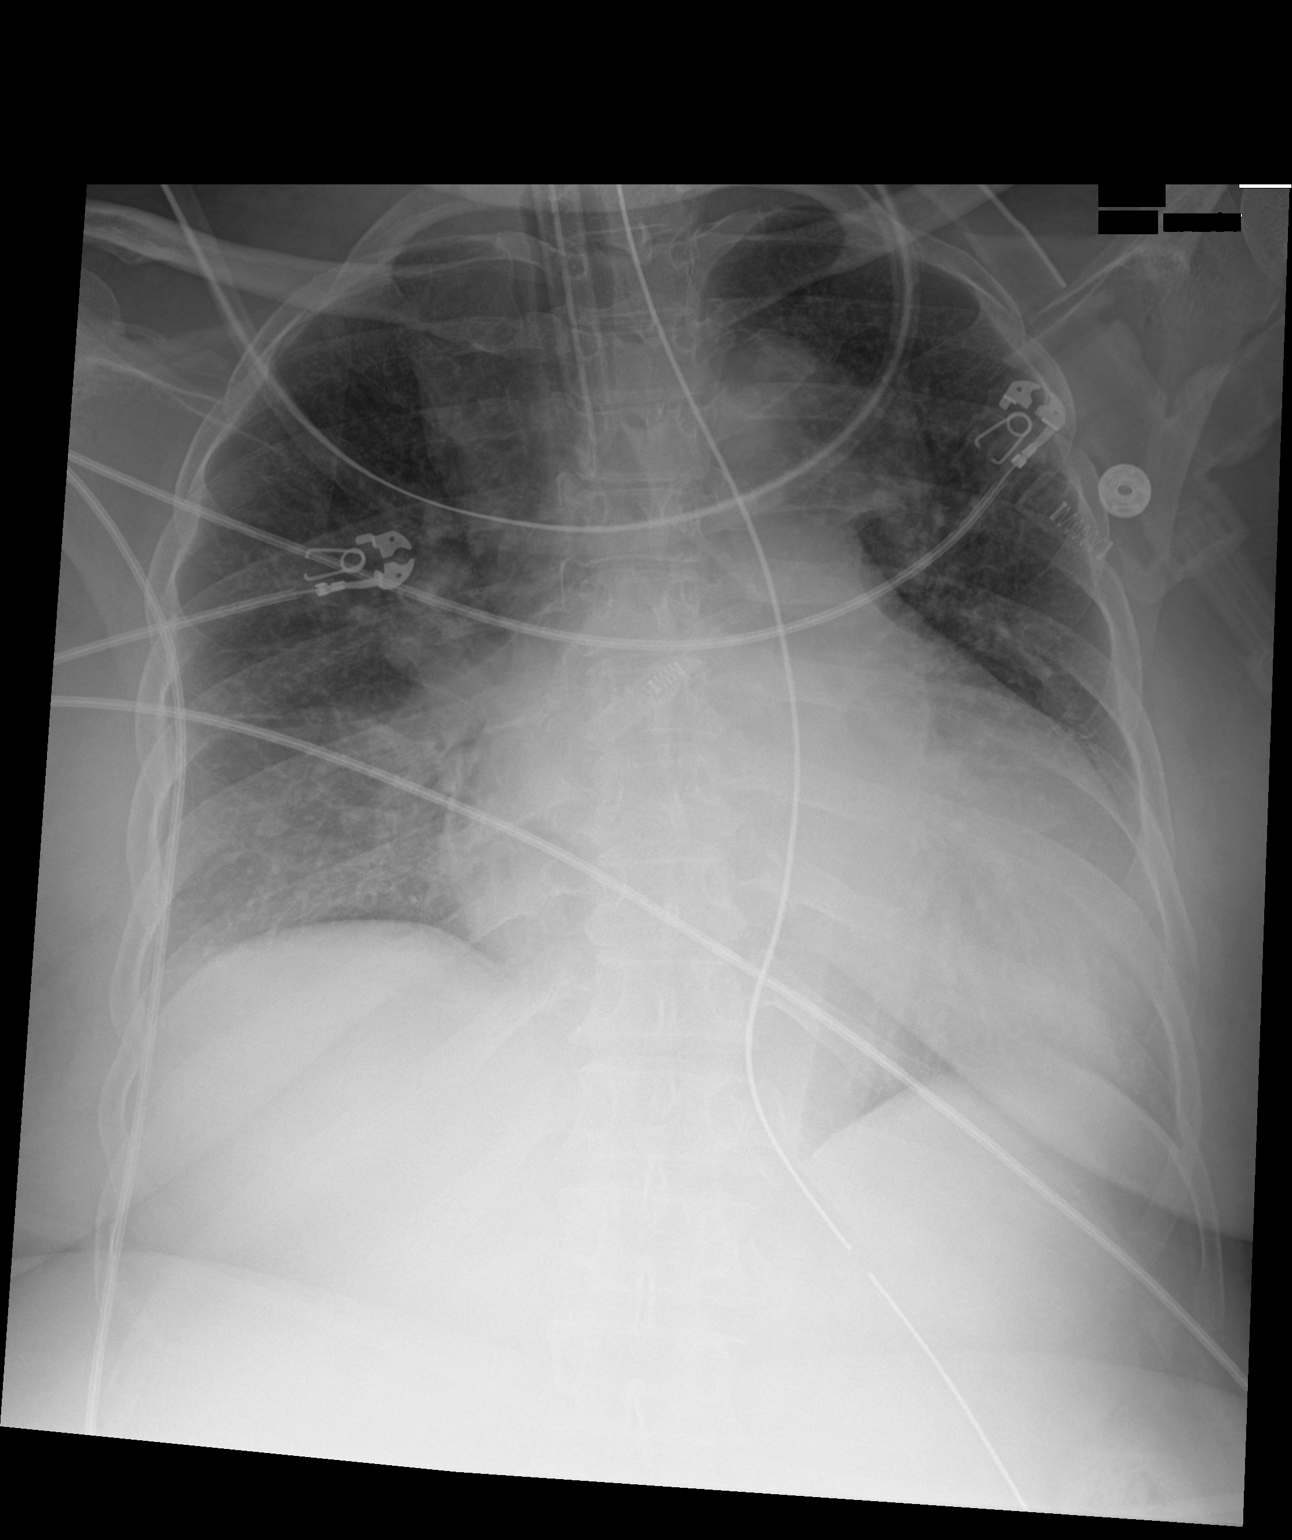

[1 of 1 positions shown; findings below may reference images not displayed]

FINDINGS: Tracheostomy tube tip is above the carina. NG tube and side port are
below the GE junction. Stable cardiomediastinal contours. Diffuse
pulmonary vascular congestion noted. No signs of pleural effusion or
edema. Left upper lobe perihilar atelectasis is improved in the
interval.
IMPRESSION: 1. Improved aeration to the left upper lobe.
2. Pulmonary vascular congestion.

## 2021-07-15 MED ORDER — FREE WATER
350.0000 mL | Freq: Four times a day (QID) | Status: DC
  Administered 2021-07-15 – 2021-07-18 (×12): 350 mL

## 2021-07-15 NOTE — Progress Notes (Signed)
  Interdisciplinary Goals of Care Family Meeting   Date carried out:: 07/15/2021  Location of the meeting: Conference room  Member's involved: Physician, Nurse Practitioner, Bedside Registered Nurse, and Family Member or next of kin  Durable Power of Attorney or acting medical decision maker: Son Robin Navarro    Discussion: We discussed goals of care for Robin Navarro .  Spoke with family members in the conference room. Updated family of poor prognosis of Robin Navarro. Family is aware and would like to go ahead with comfort care. They have a few more family members that they would like to see Robin Navarro before we withdrawal care and make comfort care. Robin Navarro states that he will let us know when everyone has seen her and then will let us know when family is ready to make comfort care. Robin Navarro wanted to go ahead and make Robin Navarro DNR.  Code status: Full DNR  Disposition: In-patient comfort care  Time spent for the meeting: 30 minutes  Mick Sell 07/15/2021, 3:19 PM

## 2021-07-15 NOTE — Progress Notes (Signed)
STROKE TEAM PROGRESS NOTE      INTERVAL HISTORY She remains intubated for respiratory failure but off sedation.  Patient remains alert today and follows only occasional midline and left-sided commands.  She remains with dense right hemiplegia.  Her cousin and her daughter were at the bedside.  Family struggling with decision about comfort care and goals of care and have scheduled a meeting this evening when her mother arrives from out of town..  She has not shown any significant improvement over the last several days has poor prognosis given her large brainstem hemorrhage she is unlikely to survive and live independently and is likely going to need prolonged ventilatory support with tracheostomy and PEG tube and prolonged stay in nursing home which she may not want.  Family is meeting with palliative care team and likely to make decision on comfort care later today.  She however remains full code for now   Vital signs stable.  Neurological exam unchanged  Vitals:   07/15/21 1400 07/15/21 1500 07/15/21 1600 07/15/21 1618  BP: (!) 159/80 (!) 161/84 (!) 163/86   Pulse: 79 75 79   Resp: (!) 24 20 (!) 22   Temp:      TempSrc:      SpO2: 95% 95% 96% 95%  Weight:      Height:       CBC:  Recent Labs  Lab 07/08/21 1825 07/09/21 0522 07/14/21 0945 07/15/21 0415  WBC 17.3*   < > 8.3 9.3  NEUTROABS 11.9*  --   --   --   HGB 7.9*   < > 9.3* 9.4*  HCT 26.0*   < > 29.8* 30.3*  MCV 91.9   < > 92.5 92.9  PLT 183   < > 158 166   < > = values in this interval not displayed.   Basic Metabolic Panel:  Recent Labs  Lab 07/11/21 1003 07/11/21 1641 07/11/21 2217 07/12/21 1821 07/13/21 0900 07/13/21 1610 07/14/21 0322 07/14/21 0945 07/15/21 0415 07/15/21 0945  NA 161*   < > 159*  159*   < > 158*   < > 155*   < > 156* 156*  K 3.8  --  4.3  --  4.5  --  4.8  --  4.6  --   CL 129*  --  128*  --  125*  --  126*  --  123*  --   CO2 20*  --  22  --  23  --  18*  --  21*  --   GLUCOSE 106*   --  143*  --  136*  --  121*  --  98  --   BUN 78*  --  83*  --  92*  --  100*  --  100*  --   CREATININE 6.30*  --  6.25*  --  5.76*  --  5.62*  --  5.21*  --   CALCIUM 8.5*  --  8.6*  --  8.8*  --  8.7*  --  9.2  --   MG  --   --   --   --  2.7*  --   --   --  2.8*  --   PHOS 5.5*  --  6.5*  --   --   --   --   --   --   --    < > = values in this interval not displayed.   Lipid Panel:  Recent Labs  Lab 07/11/21 0405  TRIG 101   HgbA1c:  No results for input(s): HGBA1C in the last 168 hours. Urine Drug Screen:  No results for input(s): LABOPIA, COCAINSCRNUR, LABBENZ, AMPHETMU, THCU, LABBARB in the last 168 hours.   Alcohol Level No results for input(s): ETH in the last 168 hours.  PHYSICAL EXAM [name] is intubated. General - Well nourished, well developed, intubated off sedation.   Cardiovascular - Regular rate and rhythm.   Neuro - intubated off sedation, awake.  Eyes open follows few midline and occasional commands on the left side., pupil right 1.5 mm and left 48m, nonreactive to light.  Corneal reflex absent bilaterally, gag and cough present. Breathing over the vent.  Facial symmetry not able to test due to ET tube.  Tongue protrusion not cooperative.  Able to move the left upper and lower extremity.  Purposefully to painful stimulus.  Right UE flaccid with only slight withdraw to pain stimulation, no movement of toes. DTR diminished and no babinski. Sensation, coordination not corporative and gait not tested.   ASSESSMENT/PLAN Robin Navarro a 55y.o. Navarro with history of hypertension, CKD, smoker right pontine ICH 07/28/2021 admitted for slurred speech, hypertensive emergency and was intubated for airway protection. No tPA given due to [place]    Patient's neurological exam remains poor and with her hemodynamic instability and large brainstem hemorrhage chances of survival without prolonged life support and trach and PEG and nursing  home care negligible.  Given worsening renal function and poor general condition she is may not be a good candidate for dialysis.  palliative care team and critical care team have already met with family to discuss goals of  care.   Discussed with Dr. HSilas Floodcritical care medicine and palliative care team.   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 Unchanged surrounding edema and mass effect with partial effacement of the fourth ventricle. As before, there is probable small-volume extension of hemorrhage into the fourth ventricle. No evidence of hydrocephalus at this time. Stable mild-to-moderate cerebral white matter chronic small vessel ischemic disease. 2D Echo EF 60 to 65% in 01/2021 LDL 50 HgbA1c 5.1 UDS negative SCDs for VTE prophylaxis No antithrombotic prior to admission, now on No antithrombotic due to [place] recommendations: Pending Disposition: Pending    Cerebral edema CT showed focal mass-effect at the brainstem On 3% saline @ 50 ->  off Sodium 143->148->152->151->159->161->162->161->161->159 Na goal 150-155 Na monitoring On FW 100 Q6h, consider increase today    History of ICH 01/2021 admitted for headache and slurred speech as well as hypertensive emergency.  CT showed right small pontine ICH.  EF 60 to 65%.  LDL 69, A1c 5.3.  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 at baseline   Hypertensive emergency Cleviprex weaned off this morning,  transitioned to oral meds via tube.  Not using BB  due to bradycardia episodes.  On hydralazine 100 Q8, and amlodipine 10 Added clonidine 0.3 TID 10/7, Isordil added 10/8.  Per discussion with Dr. KJohnney Oucan titrate isordil if  needed.  BP goal systolic less than 1299Long term BP goal normotensive This is fourth hospital visit since Sep 2021 for hypertensive emergency. Noncompliance with medications has been  an ongoing issue.    Respiratory failure Possible aspiration pneumonia Intubated for airway protection  Still on ventilation CCM on board Leukocytosis WBC 11.6->13.6->17.3->11.6->11.3 On Rocephin   AKI on CKD Creatinine 3.86->5.00->5.31->5.27->5.99->6.30 CCM on board On IV fluid Nephrology on board, not candidate for dialysis currently dose meds for CrCl < 15.     COVID PCR positive incidentally  Isolation CCM on board  Anemia Iron deficiency Hgb 9.1->7.9->7.6->7.5->6.7 Iron deficiency, IV replacement x 4 doses underway Transfuse hgb less than per nephrology, received on unit PRBCs overnight   S/p cardiac arrest 10/6 Bradycardia episodes  Patient had episode of transient or systolic cardiac arrest 80/9 and 10/6. She continues to have some intermittent bradycardia which may be due to brainstem autonomic dysfunction related to her hemorrhage.  Dysphagia Has core track tube for feeding On TF and FW Will likely need PEG tube pending family decision making for goals of care    Tobacco abuse Current smoker Smoking cessation counseling will be provided  Goals of care Palliative care following, possible family meeting re: possible trach/PEG   Other Stroke Risk Factors History of THC use, UDS negative this time        Patient neurological exam remains poor and she is unlikely to survive without prolonged ventilatory support, tracheostomy, PEG tube and nursing home care with 24-hour support.  Family struggling with decisions about comfort care hopefully will reach decision soon.  Appreciate palliative care team's help.  Discussed with patient's cousin at the bedside and Dr. Silas Flood critical care medicine and answered questions.  Hopefully there is a family meeting today in which some  decisions will be made about how to move forward with her care.This patient is critically ill and at significant risk of neurological worsening, death and care requires constant monitoring of vital signs, hemodynamics,respiratory and cardiac monitoring, extensive review of multiple databases, frequent neurological assessment, discussion with family, other specialists and medical decision making of high complexity.I have made any additions or clarifications directly to the above note.This critical care time does not reflect procedure time, or teaching time or supervisory time of PA/NP/Med Resident etc but could involve care discussion time.  I spent 30 minutes of neurocritical care time  in the care of  this patient.       Antony Contras, MD Medical Director [facility] Pager: [phone] 07/15/2021 4:47 PM

## 2021-07-15 NOTE — Progress Notes (Signed)
NAME:  Robin Navarro, MRN:  [id number], DOB:  07/24/1966, LOS: 9 ADMISSION DATE:  07/12/2021, CONSULTATION DATE:  07/23/2021 REFERRING MD:  Darl Householder, CHIEF COMPLAINT:  Slurred speech   Brief History   Large brainstem hemorrhage. Incidental COVID +  History of present illness   55 year old woman w/ hx of kidney disease, HTN, previous pontine hemorrhage presenting with slurred speech that started around 4 AM.  When EMS arrived there was a question of PEA arrest and patient received CPR for several minutes however her initial SBP was 300?Marland Kitchen  King airway placed in field for coma which was exchanged for ETT when arrived to ER.  Stat head CT shows large brainstem hemorrhage.    Significant Hospital Events   10/4 admitted, CVC placed, removed due to malposition, made DNR by palliative care 10/5 Palliative care [place] meeting- Cont DNR, treat treatable, "address need for trach/feeding tube when needed" 10/6 PM Brady->Asystole. ROSC after 2 rounds of CPR 1 epi. Full code per patient. See Xu 10/6 1826 note. 10/9: Cleviprex off 10/10>> HGB 6.7, received 2 units PRBC, family goals of care conference  Consults:  Neuro NSGY  Procedures:  CVL 10/4  Interim history/subjective:  UOP -2,235 Last 24 hours Glucose range 98-136 SBP 131-163 HR 60s overnight Na 156 Patient remains intubated on PRVC 40% Patient no mvt on R; L sided remains the same with minimal mvt (wiggles toes/thumb up but unable to lift leg/arm off bed)   Objective   Blood pressure (!) 155/97, pulse 60, temperature (!) 97.1 F (36.2 C), temperature source Axillary, resp. rate 18, height '5\' 4"'$  (1.626 m), weight 85.3 kg, last menstrual period 12/23/2013, SpO2 98 %.    Vent Mode: PRVC FiO2 (%):  [40 %] 40 % Set Rate:  [18 bmp] 18 bmp Vt Set:  [430 mL] 430 mL PEEP:  [5 cmH20] 5 cmH20 Pressure Support:  [10 cmH20] 10 cmH20 Plateau Pressure:  [17 cmH20-24 cmH20] 21 cmH20   Intake/Output Summary (Last 24 hours) at 07/15/2021 0713 Last  data filed at 07/15/2021 0600 Gross per 24 hour  Intake 3010.11 ml  Output 2235 ml  Net 775.11 ml    Filed Weights   07/11/21 0500 07/14/21 0500 07/15/21 0500  Weight: 82.3 kg 85.7 kg 85.3 kg    Examination:  General: critically ill appearing on mech vent HEENT: MM pink/moist; ETT in place Neuro: opens eyes to voice; PERRL; 2 mm pupils b/l; gives thumbs up and wiggles toes on left; very weak on left side and not able to keep arm or leg up in air; No mvt on Right CV: s1s2, RRR, no m/r/g PULM:  dim clear BS bilaterally; on mech vent PRVC GI: soft, bsx4 active  Extremities: warm/dry, no edema  Skin: no rashes or lesions   Labs: Na 156, BUN 100, Creat 5.21 (5.62) Hgb 9.4   Resolved Hospital Problem list   Demand cardiac ischemia 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 infusion  Assessment & Plan:   Acute pontine/midbrain intraparenchymal hemorrhage in the setting of hypertensive emergency ICH score of 4 Small IVH Cerebral edema Hypertensive emergency Iatrogenic hypernatremia Toxic Metabolic encephalopathy P: -Neuro following -will increase free water for Na goal 150-155; continue to check sodium -Frequent neuro exams -limit sedating meds -SBP goal <160 -family meeting scheduled at 2pm today w/ family  Hypertension/ Bradycardia P: -SBP range 131-163; SBP goal <160 -continue norvasc, hydralazine, isordil, and clonidine -consider adding minoxidil if BP remains elevated -hold BB due to bradycardia; avoid Ace/Arb/HCTZ  due to AKI -trend BMP/Mag   S/p PEA cardiac arrest P: -continue telemetry monitoring -supportive care  Acute respiratory failure secondary hemorrhage acute stroke Aspiration pneumonia COVID 19, incidental CPAP PS at intervals P: -unasyn stopped yesterday -continue mech vent support with PRVC 6-8 cc/kg -wean fio2 for sats >92% -VAP prevention in place -PSV trials as tolerated -continue Covid isolation precautions -continue [place]  discussion; may need to consider trach if family does not want comfort  Anemia of chronic disease P: -trend CBC -transfuse for hgb <7  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 P: -nephrology following -continue to trend UOP/BMP -Replace electrolytes as indicated -Avoid nephrotoxic agents, ensure adequate renal perfusion  Coffee-ground emesis likely due to upper GI bleeding P: - PPI   Best practice:   Best Practice (right click and "Reselect all SmartList Selections" daily)   Diet/type: Tube feeds DVT prophylaxis: SCD GI prophylaxis: PPI Central venous access:  N/A Foley:  N/A Code Status:  full code Last date of multidisciplinary goals of care discussion Palliative care following; 10/13 2 pm family meeting  Critical Care time: 35 minutes   JD Rexene Agent [facility] 07/15/2021, 7:13 AM  Please see [email or web address] for pager details.  From 7A-7P if no response, please call [phone]. After hours, please call ELink [phone].

## 2021-07-16 LAB — BASIC METABOLIC PANEL
Anion gap: 10 (ref 5–15)
BUN: 107 mg/dL — ABNORMAL HIGH (ref 6–20)
CO2: 21 mmol/L — ABNORMAL LOW (ref 22–32)
Calcium: 9.2 mg/dL (ref 8.9–10.3)
Chloride: 121 mmol/L — ABNORMAL HIGH (ref 98–111)
Creatinine, Ser: 5.34 mg/dL — ABNORMAL HIGH (ref 0.44–1.00)
GFR, Estimated: 9 mL/min — ABNORMAL LOW (ref >60)
Glucose, Bld: 147 mg/dL — ABNORMAL HIGH (ref 70–99)
Potassium: 4.3 mmol/L (ref 3.5–5.1)
Sodium: 152 mmol/L — ABNORMAL HIGH (ref 135–145)

## 2021-07-16 LAB — SODIUM
Sodium: 154 mmol/L — ABNORMAL HIGH (ref 135–145)
Sodium: 152 mmol/L — ABNORMAL HIGH (ref 135–145)
Sodium: 154 mmol/L — ABNORMAL HIGH (ref 135–145)

## 2021-07-16 LAB — CBC
HCT: 31.1 % — ABNORMAL LOW (ref 36.0–46.0)
Hemoglobin: 9.7 g/dL — ABNORMAL LOW (ref 12.0–15.0)
MCH: 29 pg (ref 26.0–34.0)
MCHC: 31.2 g/dL (ref 30.0–36.0)
MCV: 93.1 fL (ref 80.0–100.0)
Platelets: 177 10*3/uL (ref 150–400)
RBC: 3.34 MIL/uL — ABNORMAL LOW (ref 3.87–5.11)
RDW: 18.1 % — ABNORMAL HIGH (ref 11.5–15.5)
WBC: 9.8 10*3/uL (ref 4.0–10.5)
nRBC: 0.5 % — ABNORMAL HIGH (ref 0.0–0.2)

## 2021-07-16 LAB — GLUCOSE, CAPILLARY
Glucose-Capillary: 141 mg/dL — ABNORMAL HIGH (ref 70–99)
Glucose-Capillary: 126 mg/dL — ABNORMAL HIGH (ref 70–99)
Glucose-Capillary: 106 mg/dL — ABNORMAL HIGH (ref 70–99)
Glucose-Capillary: 132 mg/dL — ABNORMAL HIGH (ref 70–99)
Glucose-Capillary: 118 mg/dL — ABNORMAL HIGH (ref 70–99)

## 2021-07-16 MED ORDER — STROKE: EARLY STAGES OF RECOVERY BOOK
Status: AC
  Filled 2021-07-16: qty 1

## 2021-07-16 NOTE — Progress Notes (Signed)
CSW received call from patient's sister, Marliss Coots [phone]). She is visiting from [place] and requested assistance with patient's Medicaid application. CSW sent request to [place].  Gilmore Laroche, MSW, [facility]

## 2021-07-16 NOTE — Progress Notes (Addendum)
NAME:  Robin Navarro, MRN:  [id number], DOB:  September 08, 1966, LOS: 48 ADMISSION DATE:  07/30/2021, CONSULTATION DATE:  07/28/2021 REFERRING MD:  Darl Householder, CHIEF COMPLAINT:  Slurred speech   Brief History   Large brainstem hemorrhage. Incidental COVID +  History of present illness   55 year old woman w/ hx of kidney disease, HTN, previous pontine hemorrhage presenting with slurred speech that started around 4 AM.  When EMS arrived there was a question of PEA arrest and patient received CPR for several minutes however her initial SBP was 300?Marland Kitchen  King airway placed in field for coma which was exchanged for ETT when arrived to ER.  Stat head CT shows large brainstem hemorrhage.    Significant Hospital Events   10/4 admitted, CVC placed, removed due to malposition, made DNR by palliative care 10/5 Palliative care [place] meeting- Cont DNR, treat treatable, "address need for trach/feeding tube when needed" 10/6 PM Brady->Asystole. ROSC after 2 rounds of CPR 1 epi. Full code per patient. See Xu 10/6 1826 note. 10/9: Cleviprex off 10/10>> HGB 6.7, received 2 units PRBC, family goals of care conference 10/13: family meeting; made DNR; plan to do comfort care when family arrives either tonight or tomorrow  Consults:  Neuro NSGY  Procedures:  CVL 10/4  Interim history/subjective:  Patient remains intubated on mech vent PRVC Mvt on left side similar to yesterday (wiggles toes and thumbs up) Family states waiting on one more family member to come today before we withdrawal   Objective   Blood pressure (!) 159/95, pulse 73, temperature 98.3 F (36.8 C), temperature source Axillary, resp. rate (!) 21, height '5\' 4"'$  (1.626 m), weight 85 kg, last menstrual period 12/23/2013, SpO2 96 %.    Vent Mode: PRVC FiO2 (%):  [30 %] 30 % Set Rate:  [18 bmp-20 bmp] 20 bmp Vt Set:  [430 mL] 430 mL PEEP:  [5 cmH20] 5 cmH20 Pressure Support:  [8 cmH20] 8 cmH20 Plateau Pressure:  [20 cmH20-21 cmH20] 20 cmH20    Intake/Output Summary (Last 24 hours) at 07/16/2021 0810 Last data filed at 07/16/2021 0700 Gross per 24 hour  Intake 2720 ml  Output 2030 ml  Net 690 ml    Filed Weights   07/14/21 0500 07/15/21 0500 07/16/21 0500  Weight: 85.7 kg 85.3 kg 85 kg    Examination:  General: critically ill appearing on mech vent HEENT: MM pink/moist; ETT in place Neuro: opens eyes to voice; PERRL; 2 mm pupils b/l; gives thumbs up and wiggles toes on left; No mvt on Right CV: s1s2, RRR with occasional brady, no m/r/g PULM:  dim clear BS bilaterally; on mech vent PRVC GI: soft, bsx4 active  Extremities: warm/dry, no edema  Skin: no rashes or lesions     Resolved Hospital Problem list   Demand cardiac ischemia 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 infusion  Assessment & Plan:   Acute pontine/midbrain intraparenchymal hemorrhage in the setting of hypertensive emergency ICH score of 4 Small IVH Cerebral edema Hypertensive emergency Iatrogenic hypernatremia Toxic Metabolic encephalopathy P: -Neuro following -continue free water for Na goal 150-155; Na checks -Frequent neuro exams -limit sedating meds -SBP goal <160 -waiting on family to come in to do comfort care later today  Hypertension/ Bradycardia P: - SBP goal <160 -continue norvasc, hydralazine, isordil, and clonidine -hold BB due to bradycardia; avoid Ace/Arb/HCTZ due to AKI -trend BMP/Mag   S/p PEA cardiac arrest P: -continue telemetry monitoring -supportive care  Acute respiratory failure secondary hemorrhage acute  stroke Aspiration pneumonia COVID 19, incidental CPAP PS at intervals P: -continue mech vent support with PRVC 6-8 cc/kg -wean fio2 for sats >92% -VAP prevention in place -day 10 of covid precautions; consulting ID to possibly come off precautions  Anemia of chronic disease P: -trend CBC -transfuse for hgb <7  AKI on CKD5 P: -nephrology following -continue to trend  UOP/BMP -Replace electrolytes as indicated -Avoid nephrotoxic agents, ensure adequate renal perfusion  Coffee-ground emesis likely due to upper GI bleeding P: - PPI   Best practice:   Best Practice (right click and "Reselect all SmartList Selections" daily)   Diet/type: Tube feeds DVT prophylaxis: SCD GI prophylaxis: PPI Central venous access:  N/A Foley:  N/A Code Status:  DNR Last date of multidisciplinary goals of care discussion Palliative care following; 10/13 family meeting; made DNR; comfort care likely 10/14 when family arrives from out of town  Critical Care time: 35 minutes   JD Rexene Agent [facility] 07/16/2021, 8:10 AM  Please see [email or web address] for pager details.  From 7A-7P if no response, please call [phone]. After hours, please call ELink [phone].

## 2021-07-16 NOTE — Progress Notes (Signed)
STROKE TEAM PROGRESS NOTE      INTERVAL HISTORY She remains intubated for respiratory failure but off sedation.  Patient is drowsy but can be aroused and follows only occasional midline and left-sided commands.  She remains with dense right hemiplegia.  Her cousin and her daughter were at the bedside.  Family has agreed to DNR yesterday and will likely withdraw ventilator support and change her to full comfort care later today.  Vital signs stable.  Neurological exam unchanged  Vitals:   07/16/21 1100 07/16/21 1111 07/16/21 1200 07/16/21 1300  BP: (!) 142/86  (!) 159/91 (!) 159/93  Pulse: 78 85 79 80  Resp: 20  (!) 21 (!) 23  Temp:   99.7 F (37.6 C)   TempSrc:   Axillary   SpO2: 95% 98% 95% 95%  Weight:      Height:       CBC:  Recent Labs  Lab 07/15/21 0415 07/16/21 0459  WBC 9.3 9.8  HGB 9.4* 9.7*  HCT 30.3* 31.1*  MCV 92.9 93.1  PLT 166 419   Basic Metabolic Panel:  Recent Labs  Lab 07/11/21 1003 07/11/21 1641 07/11/21 2217 07/12/21 1821 07/13/21 0900 07/13/21 1610 07/15/21 0415 07/15/21 0945 07/16/21 0459 07/16/21 1103  NA 161*   < > 159*  159*   < > 158*   < > 156*   < > 152* 154*  K 3.8  --  4.3  --  4.5   < > 4.6  --  4.3  --   CL 129*  --  128*  --  125*   < > 123*  --  121*  --   CO2 20*  --  22  --  23   < > 21*  --  21*  --   GLUCOSE 106*  --  143*  --  136*   < > 98  --  147*  --   BUN 78*  --  83*  --  92*   < > 100*  --  107*  --   CREATININE 6.30*  --  6.25*  --  5.76*   < > 5.21*  --  5.34*  --   CALCIUM 8.5*  --  8.6*  --  8.8*   < > 9.2  --  9.2  --   MG  --   --   --   --  2.7*  --  2.8*  --   --   --   PHOS 5.5*  --  6.5*  --   --   --   --   --   --   --    < > = values in this interval not displayed.   Lipid Panel:  Recent Labs  Lab 07/11/21 0405  TRIG 101   HgbA1c:  No results for input(s): HGBA1C in the last 168 hours. Urine Drug Screen:  No results for input(s): LABOPIA, COCAINSCRNUR, LABBENZ, AMPHETMU, THCU, LABBARB in the  last 168 hours.   Alcohol Level No results for input(s): ETH in the last 168 hours.  PHYSICAL EXAM [name] is intubated. General - Well nourished, well developed, intubated off sedation.   Cardiovascular - Regular rate and rhythm.   Neuro - intubated off sedation, awake.  Eyes open follows few midline and occasional commands on the left side., pupil right 1.5 mm and left 10m, nonreactive to light.  Corneal reflex absent bilaterally, gag and cough present. Breathing over the vent.  Facial symmetry not able to test due to ET tube.  Tongue protrusion not cooperative.  Able to move the left upper and lower extremity.  Purposefully to painful stimulus.  Right UE flaccid with only slight withdraw to pain stimulation, no movement of toes. DTR diminished and no babinski. Sensation, coordination not corporative and gait not tested.   ASSESSMENT/PLAN Robin Navarro is a 55 y.o. female with history of hypertension, CKD, smoker right pontine ICH 07/25/2021 admitted for slurred speech, hypertensive emergency and was intubated for airway protection. No tPA given due to [place].    Patient's neurological exam remains poor and with her hemodynamic instability and large brainstem hemorrhage chances of survival without prolonged life support and trach and PEG and nursing home care negligible.  Given worsening renal function and poor general condition she is may not be a good candidate for dialysis.  palliative care team and critical care team have already met with family to discuss goals of  care.   Discussed with Dr. Silas Flood critical care medicine and palliative care team.   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 Unchanged surrounding edema and mass effect with partial effacement of the fourth ventricle. As before, there is probable small-volume extension of hemorrhage into the fourth ventricle. No evidence of hydrocephalus at this time. Stable mild-to-moderate cerebral white matter chronic small vessel ischemic disease. 2D Echo EF 60 to 65% in 01/2021 LDL 50 HgbA1c 5.1 UDS negative SCDs for VTE prophylaxis No antithrombotic prior to admission, now on No antithrombotic due to [place] Therapy recommendations: Pending Disposition: Pending    Cerebral edema CT showed focal mass-effect at the brainstem On 3% saline @ 50 ->  off Sodium 143->148->152->151->159->161->162->161->161->159 Na goal 150-155 Na monitoring On FW 100 Q6h, consider increase today    History of ICH 01/2021 admitted for headache and slurred speech as well as hypertensive emergency.  CT showed right small pontine ICH.  EF 60 to 65%.  LDL 69, A1c 5.3.  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 at baseline   Hypertensive emergency Cleviprex weaned off this morning,  transitioned to oral meds via tube.  Not using BB  due to bradycardia episodes.  On hydralazine 100 Q8, and amlodipine 10 Added clonidine 0.3 TID 10/7, Isordil added 10/8.  Per discussion with Dr. Johnney Ou can titrate isordil if needed.  BP goal systolic less than 144 Long term BP goal normotensive This is fourth hospital visit since Sep 2021 for hypertensive emergency. Noncompliance with medications has been an ongoing issue.    Respiratory failure Possible aspiration pneumonia Intubated for airway protection  Still on ventilation CCM on board Leukocytosis WBC 11.6->13.6->17.3->11.6->11.3 On Rocephin   AKI on CKD Creatinine 3.86->5.00->5.31->5.27->5.99->6.30 CCM on board On IV fluid Nephrology on board, not candidate for dialysis currently dose meds for CrCl < 15.     COVID PCR positive incidentally  Isolation CCM on  board  Anemia Iron deficiency Hgb 9.1->7.9->7.6->7.5->6.7 Iron deficiency, IV replacement x 4 doses underway Transfuse hgb less than per nephrology, received on unit PRBCs overnight   S/p cardiac arrest 10/6 Bradycardia episodes  Patient had episode of transient or systolic cardiac arrest 81/8 and 10/6. She continues to have some intermittent bradycardia which may be due  to brainstem autonomic dysfunction related to her hemorrhage.  Dysphagia Has core track tube for feeding On TF and FW Will likely need PEG tube pending family decision making for goals of care    Tobacco abuse Current smoker Smoking cessation counseling will be provided  Goals of care Palliative care following, possible family meeting re: possible trach/PEG   Other Stroke Risk Factors History of THC use, UDS negative this time       Patient neurological exam continues to remain s poor and she is unlikely to survive without prolonged ventilatory support, tracheostomy, PEG tube and nursing home care with 24-hour support.  Family has agreed to DNR and will likely withdraw ventilatory support and make her comfort care later today.Marland Kitchen  Appreciate palliative care t and critical care eam's help.  Discussed with Dr. Elsworth Soho critical care medicine and answered questions.  .This patient is critically ill and at significant risk of neurological worsening, death and care requires constant monitoring of vital signs, hemodynamics,respiratory and cardiac monitoring, extensive review of multiple databases, frequent neurological assessment, discussion with family, other specialists and medical decision making of high complexity.I have made any additions or clarifications directly to the above note.This critical care time does not reflect procedure time, or teaching time or supervisory time of PA/NP/Med Resident etc but could involve care discussion time.  I spent 30 minutes of neurocritical care time  in the care of  this patient.        Antony Contras, MD Medical Director [facility] Pager: [phone] 07/16/2021 1:41 PM

## 2021-07-16 NOTE — Progress Notes (Addendum)
Daily Progress Note   Patient Name: Robin Navarro       Date: 07/16/2021 DOB: 07-18-1966  Age: 55 y.o. MRN#: [id number] Attending Physician: Stroke, Md, MD Primary Care Physician: [name], MD Admit Date: 07/11/2021 Length of Stay: 10 days  Reason for Consultation/Follow-up: Establishing goals of care  HPI/Patient Profile:  55 y.o. female  with past medical history of htn, CKD, previous small pontine bleed 01/2021 admitted on 07/04/2021 with slurred speech since 4:00 am. EMS responded with possible PEA arrest requiring CPR in the field for several minutes and noted profound hypertension. Required King airway in the field and converted to ETT in the ER. Stat head CT with large brainstem hemorrhage (approx 16 mL). PCCM, neurology, nephrology have all been consulted.   Noted AKI on CKD, seen by nephrology who is holding on HD given her current condition. Ongoing/planned neurology and neurosurgery evaluation.  Possible aspiration pneumonitis. Remains on the vent, PCCM feels suspected terminal brain bleed but time for outcomes. Repeat CT 07/10/21 with no significant change.   PMT was consulted for goals of care/family discussion given severe/likely terminal condition.  Subjective:   Subjective: Chart Reviewed. Updates received. Patient Assessed. Created space and opportunity for patient  and family to explore thoughts and feelings regarding current medical situation.  Today's Discussion: I examined the patient at bedside.  The patient is not responsive and not following simple commands.  I did call the patient's son Robin Navarro.  He states that they are no longer going to wait for the additional family member to arrive bedside prior to switch to comfort care.  However, he states he would like to speak with his family this evening to take the day and time.  He stated clearly that "I do not want this to go into the next week."  He states that he is isolating today for self-care otherwise.  I offered  emotional general support with therapeutic listening.  I answered all questions and addressed all concerns.  I encouraged him to reach out to [place] if we can help in any way.  Review of Systems  Unable to perform ROS: Intubated   Objective:   Vital Signs:  BP (!) 142/86   Pulse 78   Temp 99 F (37.2 C) (Axillary)   Resp 20   Ht '5\' 4"'$  (1.626 m)   Wt 85 kg   LMP 12/23/2013   SpO2 95%   BMI 32.17 kg/m   Physical Exam: Physical Exam Vitals and nursing note reviewed.  Constitutional:      General: She is not in acute distress.    Appearance: She is ill-appearing.  HENT:     Head: Normocephalic and atraumatic.  Cardiovascular:     Rate and Rhythm: Normal rate and regular rhythm.     Heart sounds: No murmur heard. Pulmonary:     Effort: No respiratory distress.     Breath sounds: Normal breath sounds. No wheezing or rhonchi.  Abdominal:     General: Abdomen is flat.     Palpations: Abdomen is soft.  Skin:    General: Skin is warm and dry.  Neurological:     Mental Status: She is unresponsive.     Comments: Not following simple commands today    Palliative Assessment/Data: 10%   Assessment & Plan:   Impression: Present on Admission: . Brainstem hemorrhage ([place]) . ICH (intracerebral hemorrhage) ([place])  Very ill 55 year old female who has suffered a large brainstem bleed. No hernaition at this point,  but remains at high risk. Question PEA arrest in the field. Repeat CT head around showed no further bleeding, persistent hematoma, question ventricular changes. Currently has regained some critical reflexes (gag, cough) and still without pupil reaction to light; See neurology full note for details. Has had some responsiveness development, but still high risk for worsening and mortality. Family aware of how sick she is. Indications that she would not want artificial prolongation of life without ability to interact or have quality. Family has switched her back to full code after  code event. Son Robin Navarro seems to be struggling with the burden of decisions with multiple family members involved in discussion. He will need support and guidance, assistance with grounding family to try and prevent family dynamics issues Overall decision was made to progress toward comfort care.  Has been made DNR. Per son Robin Navarro anticipate switch to comfort care Saturday vs. Sunday (to discuss with family this evening).  SUMMARY OF RECOMMENDATIONS   Remain DNR Plan transition to comfort care Saturday or Sunday pending family discussion this evening No excalation of care PMT will continue to follow  Code Status: DNR  Prognosis: < 2 weeks  Discharge Planning: Anticipated Hospital Death  Discussed with: Patient's family, medical team, nursing team  Thank you for allowing [place] to participate in the care of Robin Navarro PMT will continue to support holistically.  Time Total: 75 min  Visit consisted of counseling and education dealing with the complex and emotionally intense issues of symptom management and palliative care in the setting of serious and potentially life-threatening illness. Greater than 50%  of this time was spent counseling and coordinating care related to the above assessment and plan.  Walden Field, NP Palliative Medicine Team  Team Phone # [phone] (Nights/Weekends)  06/01/2021, 8:17 AM

## 2021-07-17 LAB — BASIC METABOLIC PANEL
Anion gap: 9 (ref 5–15)
BUN: 109 mg/dL — ABNORMAL HIGH (ref 6–20)
CO2: 21 mmol/L — ABNORMAL LOW (ref 22–32)
Calcium: 8.9 mg/dL (ref 8.9–10.3)
Chloride: 120 mmol/L — ABNORMAL HIGH (ref 98–111)
Creatinine, Ser: 5.42 mg/dL — ABNORMAL HIGH (ref 0.44–1.00)
GFR, Estimated: 9 mL/min — ABNORMAL LOW (ref >60)
Glucose, Bld: 126 mg/dL — ABNORMAL HIGH (ref 70–99)
Potassium: 4.9 mmol/L (ref 3.5–5.1)
Sodium: 150 mmol/L — ABNORMAL HIGH (ref 135–145)

## 2021-07-17 LAB — GLUCOSE, CAPILLARY
Glucose-Capillary: 103 mg/dL — ABNORMAL HIGH (ref 70–99)
Glucose-Capillary: 106 mg/dL — ABNORMAL HIGH (ref 70–99)
Glucose-Capillary: 115 mg/dL — ABNORMAL HIGH (ref 70–99)
Glucose-Capillary: 116 mg/dL — ABNORMAL HIGH (ref 70–99)
Glucose-Capillary: 125 mg/dL — ABNORMAL HIGH (ref 70–99)
Glucose-Capillary: 129 mg/dL — ABNORMAL HIGH (ref 70–99)

## 2021-07-17 LAB — CBC
HCT: 28.5 % — ABNORMAL LOW (ref 36.0–46.0)
Hemoglobin: 8.9 g/dL — ABNORMAL LOW (ref 12.0–15.0)
MCH: 29.2 pg (ref 26.0–34.0)
MCHC: 31.2 g/dL (ref 30.0–36.0)
MCV: 93.4 fL (ref 80.0–100.0)
Platelets: 130 10*3/uL — ABNORMAL LOW (ref 150–400)
RBC: 3.05 MIL/uL — ABNORMAL LOW (ref 3.87–5.11)
RDW: 18.6 % — ABNORMAL HIGH (ref 11.5–15.5)
WBC: 10 10*3/uL (ref 4.0–10.5)
nRBC: 0.4 % — ABNORMAL HIGH (ref 0.0–0.2)

## 2021-07-17 LAB — SODIUM: Sodium: 154 mmol/L — ABNORMAL HIGH (ref 135–145)

## 2021-07-17 LAB — MAGNESIUM: Magnesium: 2.9 mg/dL — ABNORMAL HIGH (ref 1.7–2.4)

## 2021-07-17 NOTE — Progress Notes (Signed)
NAME:  Robin Navarro, MRN:  [id number], DOB:  1966/08/09, LOS: 92 ADMISSION DATE:  07/21/2021, CONSULTATION DATE:  07/08/2021 REFERRING MD:  Darl Householder, CHIEF COMPLAINT:  Slurred speech   Brief History   Large brainstem hemorrhage. Incidental COVID +  History of present illness   55 year old woman w/ hx of kidney disease, HTN, previous pontine hemorrhage presenting with slurred speech that started around 4 AM.  When EMS arrived there was a question of PEA arrest and patient received CPR for several minutes however her initial SBP was 300?Marland Kitchen  King airway placed in field for coma which was exchanged for ETT when arrived to ER.  Stat head CT shows large brainstem hemorrhage.    Significant Hospital Events   10/4 admitted, CVC placed, removed due to malposition, made DNR by palliative care 10/5 Palliative care [place] meeting- Cont DNR, treat treatable, "address need for trach/feeding tube when needed" 10/6 PM Brady->Asystole. ROSC after 2 rounds of CPR 1 epi. Full code per patient. See Xu 10/6 1826 note. 10/9: Cleviprex off 10/10>> HGB 6.7, received 2 units PRBC, family goals of care conference 10/13: family meeting; made DNR; plan to do comfort care when family arrives either tonight or tomorrow  Consults:  Neuro NSGY  Procedures:  CVL 10/4  Interim history/subjective:   Critically ill, intubated Afebrile Good urine output although remains +12 L   Objective   Blood pressure 132/75, pulse 78, temperature 98.9 F (37.2 C), resp. rate 20, height '5\' 4"'$  (1.626 m), weight 85 kg, last menstrual period 12/23/2013, SpO2 93 %.    Vent Mode: PRVC FiO2 (%):  [30 %] 30 % Set Rate:  [20 bmp] 20 bmp Vt Set:  [430 mL] 430 mL PEEP:  [5 cmH20] 5 cmH20 Plateau Pressure:  [19 cmH20-23 cmH20] 19 cmH20   Intake/Output Summary (Last 24 hours) at 07/17/2021 1629 Last data filed at 07/17/2021 1600 Gross per 24 hour  Intake 2560 ml  Output 1725 ml  Net 835 ml    Filed Weights   07/15/21 0500 07/16/21  0500 07/17/21 0500  Weight: 85.3 kg 85 kg 85 kg    Examination:  General: critically ill appearing on mech vent HEENT: MM pink/moist; ETT in place Neuro: Does not follow commands, right hemiplegia CV: s1s2, RRR with occasional brady, no m/r/g PULM: Decreased breath sounds bilateral GI: soft, bsx4 active  Extremities: warm/dry, no edema  Skin: no rashes or lesions   Labs show mildly high but stable sodium, stable creatinine 5.4, no leukocytosis, stable anemia and thrombocytopenia  Resolved Hospital Problem list   Demand cardiac ischemia 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 infusion  Assessment & Plan:   Acute pontine/midbrain intraparenchymal hemorrhage in the setting of hypertensive emergency Small IVH Cerebral edema  P: -Neuro following -Frequent neuro exams  S/p PEA cardiac arrest Hypertensive emergency Bradycardia P: - SBP goal <160 -continue norvasc, hydralazine, isordil, and clonidine -hold BB due to bradycardia; avoid Ace/Arb/HCTZ due to AKI   Acute respiratory failure secondary hemorrhage acute stroke Aspiration pneumonia COVID 19, incidental -isolation precautions discontinued after day 10 per protocol  P: -continue mech vent support with PRVC 6-8 cc/kg -wean fio2 for sats >92% -VAP prevention in place   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 Seen by renal, not a candidate for dialysis P: -continue to trend UOP/BMP -Replace electrolytes as indicated -Avoid nephrotoxic agents, ensure adequate renal perfusion  Coffee-ground emesis likely due to upper GI bleeding P: - PPI  Hypernatremia-induced by 3%, continue free water  Best Practice (  right click and "Reselect all SmartList Selections" daily)   Diet/type: Tube feeds DVT prophylaxis: SCD GI prophylaxis: PPI Central venous access:  N/A Foley:  N/A Code Status:  DNR Last date of multidisciplinary goals of care discussion Palliative care following; 10/13 family meeting; made DNR; progressing to comfort  care when family ready  Waylin Dorko V. Elsworth Soho MD [facility] 07/17/2021, 4:29 PM  Please see [email or web address] for pager details.  From 7A-7P if no response, please call [phone]. After hours, please call ELink [phone].

## 2021-07-17 NOTE — Plan of Care (Signed)
  Problem: Education: Goal: Knowledge of General Education information will improve Description: Including pain rating scale, medication(s)/side effects and non-pharmacologic comfort measures Outcome: Not Progressing   Problem: Health Behavior/Discharge Planning: Goal: Ability to manage health-related needs will improve Outcome: Not Progressing   

## 2021-07-17 NOTE — Progress Notes (Addendum)
Palliative Medicine Inpatient Follow Up Note  Reason for Consultation/Follow-up: Establishing goals of care   HPI/Patient Profile:  55 y.o. female  with past medical history of htn, CKD, previous small pontine bleed 01/2021 admitted on 07/24/2021 with slurred speech since 4:00 am. EMS responded with possible PEA arrest requiring CPR in the field for several minutes and noted profound hypertension. Required King airway in the field and converted to ETT in the ER. Stat head CT with large brainstem hemorrhage (approx 16 mL). PCCM, neurology, nephrology have all been consulted.   Noted AKI on CKD, seen by nephrology who is holding on HD given her current condition. Ongoing/planned neurology and neurosurgery evaluation.  Possible aspiration pneumonitis. Remains on the vent, PCCM feels suspected terminal brain bleed but time for outcomes. Repeat CT 07/10/21 with no significant change.   PMT was consulted for goals of care/family discussion given severe/likely terminal condition.  Today's Discussion (07/17/2021):  *Please note that this is a verbal dictation therefore any spelling or grammatical errors are due to the "[place] [employer]" system interpretation.  Chart reviewed.  I spoke to patient's son, [name] over the phone.  We reviewed his mother's poor clinical state.  We discussed that [place] and his family will come in today to complete a "family prayer".  I asked him how the conversation amongst he and his family members had gone last night regarding determination of when to change her focus to comfort oriented care. [name] shares that additional conversations still need to be had and at this time he is unable to commit to a date and time though he shares after the family prayer at 20 noon today he will have a better idea.  We agreed to meet this afternoon for further conversation.  I was able to alert patient's RN, Lauree Chandler of the plan for a family prayer this afternoon.  Questions and concerns  addressed /palliative support provided ______________________________________________ Addendum:  I met with [name] and seven additional family members this afternoon.  We reviewed that the family will be ready for compassionate extubation tomorrow though they are unable right now to confirm a time.  I was able to sit with them and answer very candidly a series of questions pertaining to what end-of-life care looks like.  Great emphasis was spent on God and miracles.  I allowed patient's family time to express themselves and shared with them that I have great belief in miracles to and sometimes the greatest miracle is being taken from this world to the next.  Patient's family are all in agreement that Arnold would not want to suffer in a prolonged state as she is presently and agree to let God do God's work.  12:36 13:00 24 additional minutes  Objective Assessment: Vital Signs Vitals:   07/17/21 0800 07/17/21 0900  BP: (!) 144/94 (!) 152/87  Pulse: 73 73  Resp: (!) 21 19  Temp: 98.1 F (36.7 C)   SpO2: 95% 96%    Intake/Output Summary (Last 24 hours) at 07/17/2021 1038 Last data filed at 07/17/2021 0900 Gross per 24 hour  Intake 2560 ml  Output 2225 ml  Net 335 ml   Last Weight  Most recent update: 07/17/2021  6:26 AM    Weight  85 kg (187 lb 6.3 oz)            Physical Exam Vitals and nursing note reviewed.  Constitutional:      General: She is not in acute distress.    Appearance: She is  ill-appearing.  HENT:     Head: Normocephalic and atraumatic.  Cardiovascular:     Rate and Rhythm: Normal rate and regular rhythm.     Heart sounds: No murmur heard. Pulmonary:     Effort: On mechanical vent    Breath sounds: Normal breath sounds. No wheezing or rhonchi.  Abdominal:     General: Abdomen is flat.     Palpations: Abdomen is soft.  Skin:    General: Skin is warm and dry.  Neurological:     Mental Status: She is unresponsive.   SUMMARY OF RECOMMENDATIONS    Remain DNR Plan for family prior this afternoon, after which time patient's son [name] states he will be ready to provide a date and time for transition to comfort care No excalation of care PMT will continue to follow  Time Spent: 25 Greater than 50% of the time was spent in counseling and coordination of care ______________________________________________________________________________________ [place] Team Team Cell Phone: [phone] Please utilize secure chat with additional questions, if there is no response within 30 minutes please call the above phone number  Palliative Medicine Team providers are available by phone from 7am to 7pm daily and can be reached through the team cell phone.  Should this patient require assistance outside of these hours, please call the patient's attending physician.

## 2021-07-18 DIAGNOSIS — R0609 Other forms of dyspnea: Secondary | ICD-10-CM

## 2021-07-18 LAB — GLUCOSE, CAPILLARY
Glucose-Capillary: 117 mg/dL — ABNORMAL HIGH (ref 70–99)
Glucose-Capillary: 120 mg/dL — ABNORMAL HIGH (ref 70–99)
Glucose-Capillary: 129 mg/dL — ABNORMAL HIGH (ref 70–99)
Glucose-Capillary: 122 mg/dL — ABNORMAL HIGH (ref 70–99)

## 2021-07-18 MED ORDER — GLYCOPYRROLATE 1 MG PO TABS
1.0000 mg | ORAL_TABLET | ORAL | Status: DC | PRN
  Filled 2021-07-18: qty 1

## 2021-07-18 MED ORDER — GLYCOPYRROLATE 0.2 MG/ML IJ SOLN
0.2000 mg | INTRAMUSCULAR | Status: DC | PRN
  Administered 2021-07-18: 0.2 mg via INTRAVENOUS
  Filled 2021-07-18: qty 1

## 2021-07-18 MED ORDER — DIPHENHYDRAMINE HCL 50 MG/ML IJ SOLN: 25.0000 mg | INTRAMUSCULAR | Status: DC | PRN

## 2021-07-18 MED ORDER — MORPHINE BOLUS VIA INFUSION
5.0000 mg | INTRAVENOUS | Status: DC | PRN
  Administered 2021-07-18 (×2): 5 mg via INTRAVENOUS
  Filled 2021-07-18: qty 5

## 2021-07-18 MED ORDER — GLYCOPYRROLATE 0.2 MG/ML IJ SOLN: 0.2000 mg | INTRAMUSCULAR | Status: DC | PRN

## 2021-07-18 MED ORDER — DEXTROSE 5 % IV SOLN: INTRAVENOUS | Status: DC

## 2021-07-18 MED ORDER — MORPHINE 100MG IN NS 100ML (1MG/ML) PREMIX INFUSION
0.0000 mg/h | INTRAVENOUS | Status: DC
  Administered 2021-07-18: 5 mg/h via INTRAVENOUS
  Administered 2021-07-18: 15 mg/h via INTRAVENOUS
  Administered 2021-07-19: 12 mg/h via INTRAVENOUS
  Administered 2021-07-19: 15 mg/h via INTRAVENOUS
  Administered 2021-07-20: 6 mg/h via INTRAVENOUS
  Filled 2021-07-18 (×5): qty 100

## 2021-07-18 MED ORDER — POLYVINYL ALCOHOL 1.4 % OP SOLN
1.0000 [drp] | Freq: Four times a day (QID) | OPHTHALMIC | Status: DC | PRN
  Filled 2021-07-18: qty 15

## 2021-07-18 MED ORDER — MORPHINE SULFATE (PF) 2 MG/ML IV SOLN
2.0000 mg | INTRAVENOUS | Status: DC | PRN
  Administered 2021-07-18: 2 mg via INTRAVENOUS

## 2021-07-18 NOTE — Progress Notes (Signed)
Chaplain responded to request from RN to meet family in advance of extubation.  Chaplain engaged son, mother of pt and various friends/family in supportive conversation. Chaplain provided orientation, hospitality and prayer.  Available as needed for next step.  Luana Shu [id number]     07/18/21 1500  Clinical Encounter Type  Visited With Patient and family together  Visit Type Initial  Referral From Nurse  Consult/Referral To Chaplain  Spiritual Encounters  Spiritual Needs Prayer  Stress Factors  Patient Stress Factors Major life changes  Family Stress Factors Major life changes

## 2021-07-18 NOTE — Procedures (Signed)
Extubation Procedure Note  Patient Details:   Name: AVALENA GUSTUS DOB: [birth year] MRN: [id number]   Airway Documentation:    Vent end date: 07/18/21 Vent end time: 1626   Evaluation  Patient extubated per comfort care measures. RN and family at bedside.  Herbie Saxon Lilinoe Acklin 07/18/2021, 4:26 PM

## 2021-07-18 NOTE — Progress Notes (Signed)
NAME:  Robin Navarro, MRN:  [id number], DOB:  10-10-1965, LOS: 12 ADMISSION DATE:  07/17/2021, CONSULTATION DATE:  07/19/2021 REFERRING MD:  Darl Householder, CHIEF COMPLAINT:  Slurred speech   Brief History   Large brainstem hemorrhage. Incidental COVID +  History of present illness   55 year old woman w/ hx of kidney disease, HTN, previous pontine hemorrhage presenting with slurred speech that started around 4 AM.  When EMS arrived there was a question of PEA arrest and patient received CPR for several minutes however her initial SBP was 300?Marland Kitchen  King airway placed in field for coma which was exchanged for ETT when arrived to ER.  Stat head CT shows large brainstem hemorrhage.    Significant Hospital Events   10/4 admitted, CVC placed, removed due to malposition, made DNR by palliative care 10/5 Palliative care [place] meeting- Cont DNR, treat treatable, "address need for trach/feeding tube when needed" 10/6 PM Brady->Asystole. ROSC after 2 rounds of CPR 1 epi. Full code per patient. See Xu 10/6 1826 note. 10/9: Cleviprex off 10/10>> HGB 6.7, received 2 units PRBC, family goals of care conference 10/13: family meeting; made DNR; plan to do comfort care when family arrives either tonight or tomorrow  Consults:  Neuro NSGY  Procedures:  CVL 10/4  Interim history/subjective:   Remains intubated, critically ill Afebrile 2 L urine output last 24 hours but +12 L   Objective   Blood pressure (!) 150/93, pulse 72, temperature 98.4 F (36.9 C), temperature source Axillary, resp. rate 17, height '5\' 4"'$  (1.626 m), weight 85 kg, last menstrual period 12/23/2013, SpO2 96 %.    Vent Mode: PRVC FiO2 (%):  [30 %] 30 % Set Rate:  [20 bmp] 20 bmp Vt Set:  [430 mL] 430 mL PEEP:  [5 cmH20] 5 cmH20 Plateau Pressure:  [19 cmH20-23 cmH20] 23 cmH20   Intake/Output Summary (Last 24 hours) at 07/18/2021 0901 Last data filed at 07/18/2021 0800 Gross per 24 hour  Intake 1840 ml  Output 2250 ml  Net -410 ml     Filed Weights   07/15/21 0500 07/16/21 0500 07/17/21 0500  Weight: 85.3 kg 85 kg 85 kg    Examination:  General: critically ill appearing on mech vent HEENT: MM pink/moist; ETT in place Neuro: Blinks eyes to command, did not move extremities, right hemiplegia CV: s1s2, RRR with occasional brady, no m/r/g PULM: No accessory muscle use, decreased breath sounds bilateral GI: soft, bsx4 active  Extremities: warm/dry, no edema  Skin: no rashes or lesions   Labs show mildly high but stable sodium, stable creatinine 5.4, no leukocytosis, stable anemia and thrombocytopenia  Resolved Hospital Problem list   Demand cardiac ischemia 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 infusion  Assessment & Plan:   Acute pontine/midbrain intraparenchymal hemorrhage in the setting of hypertensive emergency Small IVH  P: -Neuro following -Frequent neuro exams  S/p PEA cardiac arrest Hypertensive emergency Bradycardia P: - SBP goal <160 -continue norvasc, hydralazine, isordil, and clonidine -hold BB due to bradycardia; avoid Ace/Arb/HCTZ due to AKI   Acute respiratory failure secondary hemorrhage acute stroke Aspiration pneumonia COVID 19, incidental -isolation precautions discontinued after day 10 per protocol  P: -Tolerate some spontaneous breathing trials -VAP prevention in place   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  Waiting for family to come to this decision, appreciate palliative care efforts here  Best Practice (right click and "Reselect all SmartList Selections" daily)   Diet/type: Tube feeds DVT prophylaxis: SCD GI prophylaxis: PPI Central venous access:  N/A Foley:  N/A Code Status:   DNR Last date of multidisciplinary goals of care discussion Palliative care following; 10/13 family meeting; made DNR; progressing to comfort care when family ready  Huber Mathers V. Elsworth Soho MD [facility] 07/18/2021, 9:01 AM  Please see [email or web address] for pager details.  From 7A-7P if no response, please call [phone]. After hours, please call ELink [phone].

## 2021-07-18 NOTE — Progress Notes (Signed)
STROKE TEAM PROGRESS NOTE   SUBJECTIVE (INTERVAL HISTORY) No family is at the bedside.  Earlier today family gathered at bedside praying for her and will make decision tomorrow for, care transition.  Patient lethargic, however eyes open on voice, able to follow simple commands but less movement than before.   OBJECTIVE Temp:  [98.1 F (36.7 C)-99.8 F (37.7 C)] 99 F (37.2 C) (10/15 2000) Pulse Rate:  [68-82] 78 (10/15 2300) Cardiac Rhythm: Normal sinus rhythm (10/15 1600) Resp:  [12-27] 20 (10/15 2300) BP: (132-156)/(75-132) 143/83 (10/15 2300) SpO2:  [90 %-97 %] 94 % (10/15 2300) FiO2 (%):  [30 %] 30 % (10/15 2300) Weight:  [85 kg] 85 kg (10/15 0500)  Recent Labs  Lab 07/17/21 0753 07/17/21 1147 07/17/21 1536 07/17/21 2011 07/17/21 2341  GLUCAP 116* 125* 129* 103* 106*   Recent Labs  Lab 07/11/21 1003 07/11/21 1641 07/11/21 2217 07/12/21 1821 07/13/21 0900 07/13/21 1610 07/14/21 0322 07/14/21 0945 07/15/21 0415 07/15/21 0945 07/16/21 0459 07/16/21 1103 07/16/21 1526 07/16/21 2100 07/17/21 0445 07/17/21 1008  NA 161*   < > 159*  159*   < > 158*   < > 155*   < > 156*   < > 152* 154* 152* 154* 150* 154*  K 3.8  --  4.3  --  4.5  --  4.8  --  4.6  --  4.3  --   --   --  4.9  --   CL 129*  --  128*  --  125*  --  126*  --  123*  --  121*  --   --   --  120*  --   CO2 20*  --  22  --  23  --  18*  --  21*  --  21*  --   --   --  21*  --   GLUCOSE 106*  --  143*  --  136*  --  121*  --  98  --  147*  --   --   --  126*  --   BUN 78*  --  83*  --  92*  --  100*  --  100*  --  107*  --   --   --  109*  --   CREATININE 6.30*  --  6.25*  --  5.76*  --  5.62*  --  5.21*  --  5.34*  --   --   --  5.42*  --   CALCIUM 8.5*  --  8.6*  --  8.8*  --  8.7*  --  9.2  --  9.2  --   --   --  8.9  --   MG  --   --   --   --  2.7*  --   --   --  2.8*  --   --   --   --   --  2.9*  --   PHOS 5.5*  --  6.5*  --   --   --   --   --   --   --   --   --   --   --   --   --    < > =  values in this interval not displayed.   Recent Labs  Lab 07/11/21 1003 07/11/21 2217  ALBUMIN 2.2* 2.1*   Recent Labs  Lab 07/12/21 1821 07/14/21 0945 07/15/21 0415 07/16/21 0459 07/17/21 0445  WBC 9.5 8.3 9.3 9.8 10.0  HGB 9.2* 9.3* 9.4* 9.7* 8.9*  HCT 29.3* 29.8* 30.3* 31.1* 28.5*  MCV 90.7 92.5 92.9 93.1 93.4  PLT 147* 158 166 177 130*   No results for input(s): CKTOTAL, CKMB, CKMBINDEX, TROPONINI in the last 168 hours. No results for input(s): LABPROT, INR in the last 72 hours.  No results for input(s): COLORURINE, LABSPEC, [place], GLUCOSEU, HGBUR, BILIRUBINUR, KETONESUR, PROTEINUR, UROBILINOGEN, NITRITE, LEUKOCYTESUR in the last 72 hours.  Invalid input(s): APPERANCEUR      Component Value Date/Time   CHOL 157 07/08/2021 0354   TRIG 101 07/11/2021 0405   HDL 37 (L) 07/08/2021 0354   CHOLHDL 4.2 07/08/2021 0354   VLDL 70 (H) 07/08/2021 0354   LDLCALC 50 07/08/2021 0354   Lab Results  Component Value Date   HGBA1C 5.1 07/08/2021      Component Value Date/Time   LABOPIA NONE DETECTED 07/31/2021 0912   COCAINSCRNUR NONE DETECTED 08/01/2021 0912   LABBENZ NONE DETECTED 07/03/2021 0912   AMPHETMU NONE DETECTED 07/07/2021 0912   THCU NONE DETECTED 07/11/2021 0912   LABBARB NONE DETECTED 07/16/2021 0912    No results for input(s): ETH in the last 168 hours.  I have personally reviewed the radiological images below and agree with the radiology interpretations.  CT HEAD WO CONTRAST (5MM)  Result Date: 07/10/2021 CLINICAL DATA:  Neuro deficit, acute, stroke suspected. EXAM: CT HEAD WITHOUT CONTRAST TECHNIQUE: Contiguous axial images were obtained from the base of the skull through the vertex without intravenous contrast. COMPARISON:  Prior head CT examinations 07/09/2021 and earlier. FINDINGS: Brain: Cerebral volume is normal. A an acute parenchymal hemorrhage within the pons and midbrain has not significantly changed in size as compared to the prior head CT of  07/09/2021. Unchanged surrounding edema and mass effect with partial effacement the fourth ventricle. As before, there is small volume extension of hemorrhage into the fourth ventricle. No evidence of hydrocephalus at this time. Mild-to-moderate patchy and ill-defined hypoattenuation within the cerebral white matter, nonspecific but compatible with chronic small vessel ischemic disease. Vascular: No hyperdense vessel.  Atherosclerotic calcifications. Skull: Normal. Negative for fracture or focal lesion. Sinuses/Orbits: Visualized orbits show no acute finding. Chronic medially displaced fracture deformities of the lamina papyracea bilaterally. Scattered mild paranasal sinus mucosal thickening. Small volume frothy secretions within the right sphenoid sinus. Other: Trace fluid within the bilateral mastoid air cells. IMPRESSION: Acute parenchymal hemorrhage within the pons and midbrain, not significant changed as compared to the head CT of 08/02/2021. Unchanged surrounding edema and mass effect with partial effacement of the fourth ventricle. As before, there is probable small-volume extension of hemorrhage into the fourth ventricle. No evidence of hydrocephalus at this time. Stable mild-to-moderate cerebral white matter chronic small vessel ischemic disease. Paranasal sinus disease, as described. Electronically Signed   By: Kellie Simmering D.O.   On: 07/10/2021 12:32   CT HEAD WO CONTRAST (5MM)  Result Date: 07/04/2021 CLINICAL DATA:  Stroke.  Follow-up. EXAM: CT HEAD WITHOUT CONTRAST TECHNIQUE: Contiguous axial images were obtained from the base of the skull through the vertex without intravenous contrast. COMPARISON:  Head CT earlier same day. FINDINGS: Brain: Intraparenchymal hemorrhage within the pons and left midbrain appears similar. The density is becoming less distinct. Approximate size of the hematoma remains 3.5 cm. Edema is seen throughout the pons and midbrain. Small amount of fourth ventricular blood as  seen previously. No evidence of additional bleeding since the prior exam. Question if there could be slight increase  in fullness of the temporal horns of the lateral ventricles that could be early evidence of hydrocephalus. Otherwise, there are mild chronic small-vessel ischemic changes of the cerebral hemispheric white matter. Vascular: No new vascular finding. Skull: Negative Sinuses/Orbits: Clear/normal Other: None IMPRESSION: No evidence of progressive hemorrhage. Intraparenchymal hemorrhage in the pons and left midbrain with surrounding edema. Small amount of intraventricular blood in the fourth ventricle. Question slight increase in size of the temporal horns of the lateral ventricles. Electronically Signed   By: Nelson Chimes M.D.   On: 08/02/2021 15:32   CT HEAD WO CONTRAST (5MM)  Result Date: 07/11/2021 CLINICAL DATA:  55 year old female status cardiac arrest and CPR. EXAM: CT HEAD WITHOUT CONTRAST TECHNIQUE: Contiguous axial images were obtained from the base of the skull through the vertex without intravenous contrast. COMPARISON:  Head CT 01/22/2021. brain MRI 01/22/2021. FINDINGS: Brain: Relatively large and lobulated acute brainstem hemorrhage. Hyperdense blood products encompass 39 x 26 x 32 mm (AP by transverse by CC) - 16 mL centered at the mid pons and tracking both cephalad into the left midbrain and laterally toward the left cerebellar peduncle. This is distinct from a much smaller right ventral pontine hemorrhage seen in April. There is generalized pontine edema. Mildly effaced basilar cisterns. Cisterna magna remains patent. No convincing intraventricular or extra-axial extension of blood at this time. No ventriculomegaly. No superimposed acute cortically based infarct identified. There is scattered supratentorial white matter hypodensity which is chronic and stable. Vascular: Mild Calcified atherosclerosis at the skull base. No suspicious intracranial vascular hyperdensity. Skull: No acute  osseous abnormality identified. Chronic bilateral lamina papyracea fractures. Sinuses/Orbits: Intubated on the scout view. Fluid in the visible pharynx. Chronic lamina papyracea fractures. Mild paranasal sinus fluid and/or mucosal thickening now. Tympanic cavities and mastoids remain clear. Other: No acute orbit or scalp soft tissue finding. Chronic right posterior convexity scalp lipoma. IMPRESSION: 1. Primary Acute Brainstem Hemorrhage, with a relatively large 16 mL volume of hyperdense blood within the pons, tracking cephalad into the left midbrain and laterally towards the left cerebellar peduncle. Pontine edema. 2. No intraventricular or extra-axial extension. No ventriculomegaly. And basilar cisterns remain patent at this time. 3. 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 Critical Value/emergent results were called by telephone at the time of interpretation on 07/07/2021 at 6:16 am to Dr. Shirlyn Goltz , who verbally acknowledged these results. Electronically Signed   By: Genevie Ann M.D.   On: 07/21/2021 06:20   DG Chest Port 1 View  Result Date: 07/15/2021 CLINICAL DATA:  Acute respiratory failure with hypoxia EXAM: PORTABLE CHEST 1 VIEW COMPARISON:  07/13/2021 FINDINGS: Tracheostomy tube tip is above the carina. NG tube and side port are below the GE junction. Stable cardiomediastinal contours. Diffuse pulmonary vascular congestion noted. No signs of pleural effusion or edema. Left upper lobe perihilar atelectasis is improved in the interval. IMPRESSION: 1. Improved aeration to the left upper lobe. 2. Pulmonary vascular congestion. Electronically Signed   By: Kerby Moors M.D.   On: 07/15/2021 07:32   DG Chest Port 1 View  Result Date: 07/04/2021 CLINICAL DATA:  Acute respiratory failure, COVID positive. EXAM: PORTABLE CHEST 1 VIEW COMPARISON:  July 06, 2021 (11:10 a.m.)  FINDINGS: There is stable endotracheal tube and nasogastric tube positioning. Mild left suprahilar atelectasis and/or infiltrate is noted. This is mildly increased in severity when compared to the  prior study. There is no evidence of a pleural effusion or pneumothorax. The cardiac silhouette is mildly enlarged. Stable prominence of the superior mediastinum is noted with irregular borders along the expected region of the aortic arch. The visualized skeletal structures are unremarkable. IMPRESSION: 1. Mild left suprahilar atelectasis and/or infiltrate, increased in severity when compared to the prior study. 2. Persistent abnormal upper mediastinal contours. Further evaluation with chest CT is recommended. Electronically Signed   By: Virgina Norfolk M.D.   On: 07/29/2021 22:39   DG CHEST PORT 1 VIEW  Result Date: 07/15/2021 CLINICAL DATA:  Line placement, ETT, COVID positive EXAM: PORTABLE CHEST 1 VIEW COMPARISON:  Chest radiograph obtained earlier the same day FINDINGS: The endotracheal tube is in the midthoracic trachea approximately 3.3 cm from the carina. The cardiac silhouette is stable. The upper mediastinum appears widened, similar to the most recent prior study but increased compared to a more remote study from 2018. There is increased vascular congestion. There is no new focal consolidation. There is no pleural effusion. There is no pneumothorax. The bones are stable. IMPRESSION: 1. Unchanged abnormal upper mediastinal contours. CT chest is recommended for better evaluation. 2. Increased vascular congestion without overt pulmonary edema. Electronically Signed   By: Valetta Mole M.D.   On: 07/11/2021 11:39   DG Chest Port 1 View  Result Date: 07/26/2021 CLINICAL DATA:  55 year old female with brainstem hemorrhage, status post CPR. EXAM: PORTABLE CHEST 1 VIEW COMPARISON:  Chest radiographs 02/06/2017 and earlier. FINDINGS: Portable AP supine view at 0555 hours. Endotracheal tube tip in good position  between the clavicles and carina. Enteric tube courses to the abdomen, tip not included. Indistinct superior mediastinal contour, a including in the aortic arch region. Cardiac size appears stable and within normal limits. No pneumothorax, pulmonary edema or consolidation evident on this supine view. There is hypo ventilation at the left lung base. Mildly gas distended stomach. No acute osseous abnormality identified. IMPRESSION: 1. Satisfactory ET tube and visible enteric tube. 2. Indistinct superior mediastinal contours, nonspecific. Unclear whether this is related to bilateral superior perihilar lung opacity (such as due to aspiration in this setting), or an abnormal mediastinum. Chest CT would best evaluate further. 3. Left lung base atelectasis suspected. Electronically Signed   By: Genevie Ann M.D.   On: 07/29/2021 07:02   [place] EKG SITE RITE  Result Date: 08/01/2021 If Site Rite image not attached, placement could not be confirmed due to current cardiac rhythm.    PHYSICAL EXAM  Temp:  [98.1 F (36.7 C)-99.8 F (37.7 C)] 99 F (37.2 C) (10/15 2000) Pulse Rate:  [68-82] 78 (10/15 2300) Resp:  [12-27] 20 (10/15 2300) BP: (132-156)/(75-132) 143/83 (10/15 2300) SpO2:  [90 %-97 %] 94 % (10/15 2300) FiO2 (%):  [30 %] 30 % (10/15 2300) Weight:  [85 kg] 85 kg (10/15 0500)  General - Well nourished, well developed, intubated off sedation.  Ophthalmologic - fundi not visualized due to noncooperation.  Cardiovascular - Regular rate and rhythm.  Neuro - intubated off sedation, eyes closed, lethargic but able to open with voice, able to follow limited simple command with eye movement and left foot wiggling. With eye opening, eyes in mid position, not blinking to visual threat bilaterally, able to have downward gaze bilaterally, and partial upward gaze bilaterally, however no movement horizontally, pupils 1 mm bilaterally, nonreactive to light.  Corneal reflex absent bilaterally, gag and cough present.  Breathing over the vent.  Facial symmetry not able to test due to  ET tube.  Tongue protrusion not cooperative.  Left upper extremity no movement finger grip 2/5.  Left lower extremity proximal 0/5, distal toe DF/PF 3/5.  Right UE and LE flaccid, no movement. DTR diminished and no babinski. Sensation, coordination not corporative and gait not tested.   ASSESSMENT/PLAN Robin Navarro is a 55 y.o. 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 No tPA given due to [place].    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 CT head large pontine and midbrain ICH with small IVH CT repeat x2 stable hematoma, questionable slight increase ventricular size 2D Echo EF 60 to 65% in 01/2021 LDL 50 HgbA1c 5.1 UDS negative SCDs for VTE prophylaxis No antithrombotic prior to admission, now on No antithrombotic due to [place] Therapy recommendations: Pending Disposition: family plan to make decision for comfort care tomorrow  Cerebral edema CT showed focal mass-effect at the brainstem On 3% saline @ 50 ->  off Sodium 143->148->152->151->159->161->154->152 Allow Na trending down On FW 350 Q6h  History of ICH 01/2021 admitted for headache and slurred speech as well as hypertensive emergency.  CT showed right small pontine ICH.  EF 60 to 65%.  LDL 69, A1c 5.3.  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 at baseline  Hypertensive emergency Unstable Still on Cleviprex maximize dose Labetalol as needed On hydralazine 75 Q8, and amlodipine 10 on clonidine 0.3 tid, lsosobide 40 tid BP goal less than 160 Long term BP goal normotensive  Respiratory failure Possible aspirin pneumonia Intubated for airway protection  Still on ventilation CCM on board Leukocytosis WBC 11.6->13.6->10.0 Off Abx  AKI on CKD Creatinine  3.86->5.00->5.31->5.27->5.42 CCM on board On TF and FW Nephrology on board, not candidate for dialysis  COVID PCR positive Off isolation CCM on board  Dysphagia Has OG tube On TF @ 60 and FW 350 Q6h  Tobacco abuse Current smoker Smoking cessation counseling will be provided  Other Stroke Risk Factors History of THC use, UDS negative this time  Other Active Problems   Hospital day # 12  This patient is critically ill due to large brainstem bleeding, severe renal failure, hypertensive emergency, respiratory failure, hydrocephalus and at significant risk of neurological worsening, death form brain herniation, renal failure, seizure, hypertensive encephalopathy. This patient's care requires constant monitoring of vital signs, hemodynamics, respiratory and cardiac monitoring, review of multiple databases, neurological assessment, discussion with family, other specialists and medical decision making of high complexity. I spent 35 minutes of neurocritical care time in the care of this patient.    Rosalin Hawking, MD PhD Stroke Neurology 07/18/2021 12:26 AM    To contact Stroke Continuity provider, please refer to [email or web address] After hours, contact General Neurology

## 2021-07-18 NOTE — Progress Notes (Signed)
Patient ID: KHRISTIE SAK, female   DOB: [birth year], 55 y.o.   MRN: [id number]    Progress Note from the Palliative Medicine Team at [facility]   Patient Name: Robin Navarro        Date: 07/18/2021 DOB: 1965-12-30  Age: 55 y.o. MRN#: [id number] Attending Physician: Stroke, Md, MD Primary Care Physician: [name], MD Admit Date: 07/12/2021   Medical records reviewed   55 year old female with past medical history significant for hypertension, CKD, previous small pontine bleeds ( 01/2021), admitted on 07/26/2021 with slurred speech.  EMS responded with possible PEA arrest requiring CPR in the field for several minutes and noted profound hypertension.  Intubated in the field and converted to ET tube in ER.  Stat head CT significant for large brainstem hemorrhage.  Repeat head CT 07/10/2021 with no significant change.  Long-term poor prognosis.  Patient's son is decision-maker for this patient.  Many family members involved and supportive.   Family has made decision to liberate patient from the vent allowing for natural death.    Initial palliative medicine consult 07/14/2021  This NP visited patient at the bedside as a follow up for palliative medicine needs and emotional support.  I spoke with patient's son/[name], he tells me he and family are ready to liberate patient from ventilator today after quiet hours.  I met with Jaquis and multiple family members detailing and educating on  the process of liberating the patient from the ventilator, and detailing logistics of comfort measures.     Chaplain consulted and made aware of family's need.  Plan of Care: -DNR/DNI -Liberate/one way extubation today after quiet hours (4:00-4:30).  Family is gathering to support each other.  -Symptom management, see orders -Comfort and dignity,  allowing for a natural death. - Prognosis is likely hours to a day   Education offered on the natural trajectory and expectations of  end-of-life.  Questions and concerns addressed   Discussed with Dr Elsworth Soho and bedside RN   Total time spent on the unit was 60 minutes  Greater than 50% of the time was spent in counseling and coordination of care  Wadie Lessen NP  Palliative Medicine Team Team Phone # [phone] Pager [phone]

## 2021-07-18 NOTE — Progress Notes (Signed)
STROKE TEAM PROGRESS NOTE   SUBJECTIVE (INTERVAL HISTORY) No family is at the bedside.  Pt intubated, lying in bed, open eyes with voice, still following commands on the left. Family will make decision today about timing of comfort care measures.    OBJECTIVE Temp:  [97.9 F (36.6 C)-99 F (37.2 C)] 98.6 F (37 C) (10/16 1500) Pulse Rate:  [63-81] 81 (10/16 1700) Cardiac Rhythm: Normal sinus rhythm (10/16 1600) Resp:  [15-23] 15 (10/16 1700) BP: (123-150)/(74-96) 123/76 (10/16 1700) SpO2:  [85 %-97 %] 85 % (10/16 1700) FiO2 (%):  [30 %] 30 % (10/16 1200)  Recent Labs  Lab 07/17/21 2341 07/18/21 0411 07/18/21 0756 07/18/21 1128 07/18/21 1538  GLUCAP 106* 117* 120* 129* 122*   Recent Labs  Lab 07/13/21 0900 07/13/21 1610 07/14/21 0322 07/14/21 0945 07/15/21 0415 07/15/21 0945 07/16/21 0459 07/16/21 1103 07/16/21 1526 07/16/21 2100 07/17/21 0445 07/17/21 1008  NA 158*   < > 155*   < > 156*   < > 152* 154* 152* 154* 150* 154*  K 4.5  --  4.8  --  4.6  --  4.3  --   --   --  4.9  --   CL 125*  --  126*  --  123*  --  121*  --   --   --  120*  --   CO2 23  --  18*  --  21*  --  21*  --   --   --  21*  --   GLUCOSE 136*  --  121*  --  98  --  147*  --   --   --  126*  --   BUN 92*  --  100*  --  100*  --  107*  --   --   --  109*  --   CREATININE 5.76*  --  5.62*  --  5.21*  --  5.34*  --   --   --  5.42*  --   CALCIUM 8.8*  --  8.7*  --  9.2  --  9.2  --   --   --  8.9  --   MG 2.7*  --   --   --  2.8*  --   --   --   --   --  2.9*  --    < > = values in this interval not displayed.   No results for input(s): AST, ALT, ALKPHOS, BILITOT, PROT, ALBUMIN in the last 168 hours.  Recent Labs  Lab 07/12/21 1821 07/14/21 0945 07/15/21 0415 07/16/21 0459 07/17/21 0445  WBC 9.5 8.3 9.3 9.8 10.0  HGB 9.2* 9.3* 9.4* 9.7* 8.9*  HCT 29.3* 29.8* 30.3* 31.1* 28.5*  MCV 90.7 92.5 92.9 93.1 93.4  PLT 147* 158 166 177 130*   No results for input(s): CKTOTAL, CKMB, CKMBINDEX,  TROPONINI in the last 168 hours. No results for input(s): LABPROT, INR in the last 72 hours.  No results for input(s): COLORURINE, LABSPEC, [place], GLUCOSEU, HGBUR, BILIRUBINUR, KETONESUR, PROTEINUR, UROBILINOGEN, NITRITE, LEUKOCYTESUR in the last 72 hours.  Invalid input(s): APPERANCEUR      Component Value Date/Time   CHOL 157 07/08/2021 0354   TRIG 101 07/11/2021 0405   HDL 37 (L) 07/08/2021 0354   CHOLHDL 4.2 07/08/2021 0354   VLDL 70 (H) 07/08/2021 0354   LDLCALC 50 07/08/2021 0354   Lab Results  Component Value Date   HGBA1C 5.1 07/08/2021  Component Value Date/Time   LABOPIA NONE DETECTED 07/27/2021 0912   COCAINSCRNUR NONE DETECTED 07/28/2021 0912   LABBENZ NONE DETECTED 07/05/2021 0912   AMPHETMU NONE DETECTED 07/09/2021 0912   THCU NONE DETECTED 07/30/2021 0912   LABBARB NONE DETECTED 07/03/2021 0912    No results for input(s): ETH in the last 168 hours.  I have personally reviewed the radiological images below and agree with the radiology interpretations.  CT HEAD WO CONTRAST (5MM)  Result Date: 07/10/2021 CLINICAL DATA:  Neuro deficit, acute, stroke suspected. EXAM: CT HEAD WITHOUT CONTRAST TECHNIQUE: Contiguous axial images were obtained from the base of the skull through the vertex without intravenous contrast. COMPARISON:  Prior head CT examinations 08/02/2021 and earlier. FINDINGS: Brain: Cerebral volume is normal. A an acute parenchymal hemorrhage within the pons and midbrain has not significantly changed in size as compared to the prior head CT of 07/31/2021. Unchanged surrounding edema and mass effect with partial effacement the fourth ventricle. As before, there is small volume extension of hemorrhage into the fourth ventricle. No evidence of hydrocephalus at this time. Mild-to-moderate patchy and ill-defined hypoattenuation within the cerebral white matter, nonspecific but compatible with chronic small vessel ischemic disease. Vascular: No hyperdense  vessel.  Atherosclerotic calcifications. Skull: Normal. Negative for fracture or focal lesion. Sinuses/Orbits: Visualized orbits show no acute finding. Chronic medially displaced fracture deformities of the lamina papyracea bilaterally. Scattered mild paranasal sinus mucosal thickening. Small volume frothy secretions within the right sphenoid sinus. Other: Trace fluid within the bilateral mastoid air cells. IMPRESSION: Acute parenchymal hemorrhage within the pons and midbrain, not significant changed as compared to the head CT of 07/19/2021. Unchanged surrounding edema and mass effect with partial effacement of the fourth ventricle. As before, there is probable small-volume extension of hemorrhage into the fourth ventricle. No evidence of hydrocephalus at this time. Stable mild-to-moderate cerebral white matter chronic small vessel ischemic disease. Paranasal sinus disease, as described. Electronically Signed   By: Kellie Simmering D.O.   On: 07/10/2021 12:32   CT HEAD WO CONTRAST (5MM)  Result Date: 07/10/2021 CLINICAL DATA:  Stroke.  Follow-up. EXAM: CT HEAD WITHOUT CONTRAST TECHNIQUE: Contiguous axial images were obtained from the base of the skull through the vertex without intravenous contrast. COMPARISON:  Head CT earlier same day. FINDINGS: Brain: Intraparenchymal hemorrhage within the pons and left midbrain appears similar. The density is becoming less distinct. Approximate size of the hematoma remains 3.5 cm. Edema is seen throughout the pons and midbrain. Small amount of fourth ventricular blood as seen previously. No evidence of additional bleeding since the prior exam. Question if there could be slight increase in fullness of the temporal horns of the lateral ventricles that could be early evidence of hydrocephalus. Otherwise, there are mild chronic small-vessel ischemic changes of the cerebral hemispheric white matter. Vascular: No new vascular finding. Skull: Negative Sinuses/Orbits: Clear/normal  Other: None IMPRESSION: No evidence of progressive hemorrhage. Intraparenchymal hemorrhage in the pons and left midbrain with surrounding edema. Small amount of intraventricular blood in the fourth ventricle. Question slight increase in size of the temporal horns of the lateral ventricles. Electronically Signed   By: Nelson Chimes M.D.   On: 07/18/2021 15:32   CT HEAD WO CONTRAST (5MM)  Result Date: 07/19/2021 CLINICAL DATA:  55 year old female status cardiac arrest and CPR. EXAM: CT HEAD WITHOUT CONTRAST TECHNIQUE: Contiguous axial images were obtained from the base of the skull through the vertex without intravenous contrast. COMPARISON:  Head CT 01/22/2021. brain MRI 01/22/2021.  FINDINGS: Brain: Relatively large and lobulated acute brainstem hemorrhage. Hyperdense blood products encompass 39 x 26 x 32 mm (AP by transverse by CC) - 16 mL centered at the mid pons and tracking both cephalad into the left midbrain and laterally toward the left cerebellar peduncle. This is distinct from a much smaller right ventral pontine hemorrhage seen in April. There is generalized pontine edema. Mildly effaced basilar cisterns. Cisterna magna remains patent. No convincing intraventricular or extra-axial extension of blood at this time. No ventriculomegaly. No superimposed acute cortically based infarct identified. There is scattered supratentorial white matter hypodensity which is chronic and stable. Vascular: Mild Calcified atherosclerosis at the skull base. No suspicious intracranial vascular hyperdensity. Skull: No acute osseous abnormality identified. Chronic bilateral lamina papyracea fractures. Sinuses/Orbits: Intubated on the scout view. Fluid in the visible pharynx. Chronic lamina papyracea fractures. Mild paranasal sinus fluid and/or mucosal thickening now. Tympanic cavities and mastoids remain clear. Other: No acute orbit or scalp soft tissue finding. Chronic right posterior convexity scalp lipoma. IMPRESSION: 1.  Primary Acute Brainstem Hemorrhage, with a relatively large 16 mL volume of hyperdense blood within the pons, tracking cephalad into the left midbrain and laterally towards the left cerebellar peduncle. Pontine edema. 2. No intraventricular or extra-axial extension. No ventriculomegaly. And basilar cisterns remain patent at this time. 3. 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 Critical Value/emergent results were called by telephone at the time of interpretation on 07/03/2021 at 6:16 am to Dr. Shirlyn Goltz , who verbally acknowledged these results. Electronically Signed   By: Genevie Ann M.D.   On: 07/27/2021 06:20   DG Chest Port 1 View  Result Date: 07/15/2021 CLINICAL DATA:  Acute respiratory failure with hypoxia EXAM: PORTABLE CHEST 1 VIEW COMPARISON:  07/14/2021 FINDINGS: Tracheostomy tube tip is above the carina. NG tube and side port are below the GE junction. Stable cardiomediastinal contours. Diffuse pulmonary vascular congestion noted. No signs of pleural effusion or edema. Left upper lobe perihilar atelectasis is improved in the interval. IMPRESSION: 1. Improved aeration to the left upper lobe. 2. Pulmonary vascular congestion. Electronically Signed   By: Kerby Moors M.D.   On: 07/15/2021 07:32   DG Chest Port 1 View  Result Date: 07/19/2021 CLINICAL DATA:  Acute respiratory failure, COVID positive. EXAM: PORTABLE CHEST 1 VIEW COMPARISON:  July 06, 2021 (11:10 a.m.) FINDINGS: There is stable endotracheal tube and nasogastric tube positioning. Mild left suprahilar atelectasis and/or infiltrate is noted. This is mildly increased in severity when compared to the prior study. There is no evidence of a pleural effusion or pneumothorax. The cardiac silhouette is mildly enlarged. Stable prominence of the superior mediastinum is noted with irregular borders along the  expected region of the aortic arch. The visualized skeletal structures are unremarkable. IMPRESSION: 1. Mild left suprahilar atelectasis and/or infiltrate, increased in severity when compared to the prior study. 2. Persistent abnormal upper mediastinal contours. Further evaluation with chest CT is recommended. Electronically Signed   By: Virgina Norfolk M.D.   On: 07/22/2021 22:39   DG CHEST PORT 1 VIEW  Result Date: 07/03/2021 CLINICAL DATA:  Line placement, ETT, COVID positive EXAM: PORTABLE CHEST 1 VIEW COMPARISON:  Chest radiograph obtained earlier the same day FINDINGS: The endotracheal tube is in the midthoracic trachea approximately 3.3 cm from the carina. The cardiac silhouette is stable. The upper mediastinum appears widened, similar to the most  recent prior study but increased compared to a more remote study from 2018. There is increased vascular congestion. There is no new focal consolidation. There is no pleural effusion. There is no pneumothorax. The bones are stable. IMPRESSION: 1. Unchanged abnormal upper mediastinal contours. CT chest is recommended for better evaluation. 2. Increased vascular congestion without overt pulmonary edema. Electronically Signed   By: Valetta Mole M.D.   On: 08/02/2021 11:39   DG Chest Port 1 View  Result Date: 07/05/2021 CLINICAL DATA:  55 year old female with brainstem hemorrhage, status post CPR. EXAM: PORTABLE CHEST 1 VIEW COMPARISON:  Chest radiographs 02/06/2017 and earlier. FINDINGS: Portable AP supine view at 0555 hours. Endotracheal tube tip in good position between the clavicles and carina. Enteric tube courses to the abdomen, tip not included. Indistinct superior mediastinal contour, a including in the aortic arch region. Cardiac size appears stable and within normal limits. No pneumothorax, pulmonary edema or consolidation evident on this supine view. There is hypo ventilation at the left lung base. Mildly gas distended stomach. No acute osseous  abnormality identified. IMPRESSION: 1. Satisfactory ET tube and visible enteric tube. 2. Indistinct superior mediastinal contours, nonspecific. Unclear whether this is related to bilateral superior perihilar lung opacity (such as due to aspiration in this setting), or an abnormal mediastinum. Chest CT would best evaluate further. 3. Left lung base atelectasis suspected. Electronically Signed   By: Genevie Ann M.D.   On: 07/28/2021 07:02   [place] EKG SITE RITE  Result Date: 07/17/2021 If Site Rite image not attached, placement could not be confirmed due to current cardiac rhythm.    PHYSICAL EXAM  Temp:  [97.9 F (36.6 C)-99 F (37.2 C)] 98.6 F (37 C) (10/16 1500) Pulse Rate:  [63-81] 81 (10/16 1700) Resp:  [15-23] 15 (10/16 1700) BP: (123-150)/(74-96) 123/76 (10/16 1700) SpO2:  [85 %-97 %] 85 % (10/16 1700) FiO2 (%):  [30 %] 30 % (10/16 1200)  General - Well nourished, well developed, intubated off sedation.  Ophthalmologic - fundi not visualized due to noncooperation.  Cardiovascular - Regular rate and rhythm.  Neuro - intubated off sedation, eyes closed, lethargic but able to open with voice, able to follow limited simple command with eye movement and left foot wiggling. With eye opening, eyes in mid position, not blinking to visual threat bilaterally, able to have downward gaze bilaterally, and partial upward gaze bilaterally, however no movement horizontally, pupils 1 mm bilaterally, nonreactive to light.  Corneal reflex absent bilaterally, gag and cough present. Breathing over the vent.  Facial symmetry not able to test due to ET tube.  Tongue protrusion not cooperative.  Left upper extremity no movement finger grip 2/5.  Left lower extremity proximal 0/5, distal toe DF/PF 3/5.  Right UE and LE flaccid, no movement. DTR diminished and no babinski. Sensation, coordination not corporative and gait not tested.   ASSESSMENT/PLAN Robin Navarro is a 55 y.o. 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 No tPA given due to [place].    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 CT head large pontine and midbrain ICH with small IVH CT repeat x2 stable hematoma, questionable slight increase ventricular size 2D Echo EF 60 to 65% in 01/2021 LDL 50 HgbA1c 5.1 UDS negative SCDs for VTE prophylaxis No antithrombotic prior to admission, now on No antithrombotic due to [place] Therapy  recommendations: Pending Disposition: family will make decision for comfort care today  Cerebral edema CT showed focal mass-effect at the brainstem On 3% saline @ 50 ->  off Sodium 143->148->152->151->159->161->154->152 Allow Na trending down On FW 350 Q6h  History of ICH 01/2021 admitted for headache and slurred speech as well as hypertensive emergency.  CT showed right small pontine ICH.  EF 60 to 65%.  LDL 69, A1c 5.3.  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 at baseline  Hypertensive emergency Unstable Still on Cleviprex maximize dose Labetalol as needed On hydralazine 75 Q8, and amlodipine 10 on clonidine 0.3 tid, lsosobide 40 tid BP goal less than 160 Long term BP goal normotensive  Respiratory failure Possible aspirin pneumonia Intubated for airway protection  Still on ventilation CCM on board Leukocytosis WBC 11.6->13.6->10.0 Off Abx  AKI on CKD Creatinine 3.86->5.00->5.31->5.27->5.42 CCM on board On TF and FW Nephrology on board, not candidate for dialysis  COVID PCR positive Off isolation CCM on board  Dysphagia Has OG tube On TF @ 60 and FW 350 Q6h  Tobacco abuse Current smoker Smoking cessation counseling will be provided  Other Stroke Risk Factors History of THC use, UDS negative this time  Other Active Problems   Hospital day # 12  This patient is critically ill due to large  brainstem bleeding, severe renal failure, hypertensive emergency, respiratory failure, hydrocephalus and at significant risk of neurological worsening, death form brain herniation, renal failure, seizure, hypertensive encephalopathy. This patient's care requires constant monitoring of vital signs, hemodynamics, respiratory and cardiac monitoring, review of multiple databases, neurological assessment, discussion with family, other specialists and medical decision making of high complexity. I spent 35 minutes of neurocritical care time in the care of this patient.    Rosalin Hawking, MD PhD Stroke Neurology 07/18/2021 10:44 PM    To contact Stroke Continuity provider, please refer to [email or web address] After hours, contact General Neurology

## 2021-07-19 NOTE — Progress Notes (Signed)
PCCM Progress Note  Patient is no full comfort care with one way extubation preformed 1016. PCCM will sign off. Thank you for the opportunity to participate in this patient's care. Please contact if we can be of further assistance.  Robin Navarro D. Kenton Kingfisher, NP-C [facility] Personal contact information can be found on Amion  07/19/2021, 9:52 AM

## 2021-07-19 NOTE — Progress Notes (Signed)
Nutrition Brief Note  Chart reviewed. Pt now transitioning to comfort care.  No further nutrition interventions planned at this time.  Please re-consult as needed.   Royal Vand[place]ort W, RD, LDN, CDCES Registered Dietitian II Certified Diabetes Care and Education Specialist Please refer to AMION for RD and/or RD on-call/weekend/after hours pager   

## 2021-07-19 NOTE — Progress Notes (Signed)
STROKE TEAM PROGRESS NOTE   SUBJECTIVE (INTERVAL HISTORY) Multiple family members are at the bedside, including mom and son. Pt was extubated yesterday for comfort care measures, now on morphine drip and comfortable.    OBJECTIVE Temp:  [99.5 F (37.5 C)] 99.5 F (37.5 C) (10/17 1619) Pulse Rate:  [62-90] 90 (10/17 1619) Resp:  [9-60] 60 (10/17 1619) BP: (112-132)/(67-71) 112/68 (10/17 1619) SpO2:  [47 %-80 %] 47 % (10/17 1400)  Recent Labs  Lab 07/17/21 2341 07/18/21 0411 07/18/21 0756 07/18/21 1128 07/18/21 1538  GLUCAP 106* 117* 120* 129* 122*   Recent Labs  Lab 07/13/21 0900 07/13/21 1610 07/14/21 0322 07/14/21 0945 07/15/21 0415 07/15/21 0945 07/16/21 0459 07/16/21 1103 07/16/21 1526 07/16/21 2100 07/17/21 0445 07/17/21 1008  NA 158*   < > 155*   < > 156*   < > 152* 154* 152* 154* 150* 154*  K 4.5  --  4.8  --  4.6  --  4.3  --   --   --  4.9  --   CL 125*  --  126*  --  123*  --  121*  --   --   --  120*  --   CO2 23  --  18*  --  21*  --  21*  --   --   --  21*  --   GLUCOSE 136*  --  121*  --  98  --  147*  --   --   --  126*  --   BUN 92*  --  100*  --  100*  --  107*  --   --   --  109*  --   CREATININE 5.76*  --  5.62*  --  5.21*  --  5.34*  --   --   --  5.42*  --   CALCIUM 8.8*  --  8.7*  --  9.2  --  9.2  --   --   --  8.9  --   MG 2.7*  --   --   --  2.8*  --   --   --   --   --  2.9*  --    < > = values in this interval not displayed.   No results for input(s): AST, ALT, ALKPHOS, BILITOT, PROT, ALBUMIN in the last 168 hours.  Recent Labs  Lab 07/14/21 0945 07/15/21 0415 07/16/21 0459 07/17/21 0445  WBC 8.3 9.3 9.8 10.0  HGB 9.3* 9.4* 9.7* 8.9*  HCT 29.8* 30.3* 31.1* 28.5*  MCV 92.5 92.9 93.1 93.4  PLT 158 166 177 130*   No results for input(s): CKTOTAL, CKMB, CKMBINDEX, TROPONINI in the last 168 hours. No results for input(s): LABPROT, INR in the last 72 hours.  No results for input(s): COLORURINE, LABSPEC, [place], GLUCOSEU, HGBUR,  BILIRUBINUR, KETONESUR, PROTEINUR, UROBILINOGEN, NITRITE, LEUKOCYTESUR in the last 72 hours.  Invalid input(s): APPERANCEUR      Component Value Date/Time   CHOL 157 07/08/2021 0354   TRIG 101 07/11/2021 0405   HDL 37 (L) 07/08/2021 0354   CHOLHDL 4.2 07/08/2021 0354   VLDL 70 (H) 07/08/2021 0354   LDLCALC 50 07/08/2021 0354   Lab Results  Component Value Date   HGBA1C 5.1 07/08/2021      Component Value Date/Time   LABOPIA NONE DETECTED 07/18/2021 0912   COCAINSCRNUR NONE DETECTED 07/18/2021 0912   LABBENZ NONE DETECTED 07/22/2021 0912   AMPHETMU NONE DETECTED 07/05/2021 0912  THCU NONE DETECTED 07/27/2021 0912   LABBARB NONE DETECTED 07/08/2021 0912    No results for input(s): ETH in the last 168 hours.  I have personally reviewed the radiological images below and agree with the radiology interpretations.  CT HEAD WO CONTRAST (5MM)  Result Date: 07/10/2021 CLINICAL DATA:  Neuro deficit, acute, stroke suspected. EXAM: CT HEAD WITHOUT CONTRAST TECHNIQUE: Contiguous axial images were obtained from the base of the skull through the vertex without intravenous contrast. COMPARISON:  Prior head CT examinations 07/05/2021 and earlier. FINDINGS: Brain: Cerebral volume is normal. A an acute parenchymal hemorrhage within the pons and midbrain has not significantly changed in size as compared to the prior head CT of 07/22/2021. Unchanged surrounding edema and mass effect with partial effacement the fourth ventricle. As before, there is small volume extension of hemorrhage into the fourth ventricle. No evidence of hydrocephalus at this time. Mild-to-moderate patchy and ill-defined hypoattenuation within the cerebral white matter, nonspecific but compatible with chronic small vessel ischemic disease. Vascular: No hyperdense vessel.  Atherosclerotic calcifications. Skull: Normal. Negative for fracture or focal lesion. Sinuses/Orbits: Visualized orbits show no acute finding. Chronic medially  displaced fracture deformities of the lamina papyracea bilaterally. Scattered mild paranasal sinus mucosal thickening. Small volume frothy secretions within the right sphenoid sinus. Other: Trace fluid within the bilateral mastoid air cells. IMPRESSION: Acute parenchymal hemorrhage within the pons and midbrain, not significant changed as compared to the head CT of 07/17/2021. Unchanged surrounding edema and mass effect with partial effacement of the fourth ventricle. As before, there is probable small-volume extension of hemorrhage into the fourth ventricle. No evidence of hydrocephalus at this time. Stable mild-to-moderate cerebral white matter chronic small vessel ischemic disease. Paranasal sinus disease, as described. Electronically Signed   By: Kellie Simmering D.O.   On: 07/10/2021 12:32   CT HEAD WO CONTRAST (5MM)  Result Date: 07/05/2021 CLINICAL DATA:  Stroke.  Follow-up. EXAM: CT HEAD WITHOUT CONTRAST TECHNIQUE: Contiguous axial images were obtained from the base of the skull through the vertex without intravenous contrast. COMPARISON:  Head CT earlier same day. FINDINGS: Brain: Intraparenchymal hemorrhage within the pons and left midbrain appears similar. The density is becoming less distinct. Approximate size of the hematoma remains 3.5 cm. Edema is seen throughout the pons and midbrain. Small amount of fourth ventricular blood as seen previously. No evidence of additional bleeding since the prior exam. Question if there could be slight increase in fullness of the temporal horns of the lateral ventricles that could be early evidence of hydrocephalus. Otherwise, there are mild chronic small-vessel ischemic changes of the cerebral hemispheric white matter. Vascular: No new vascular finding. Skull: Negative Sinuses/Orbits: Clear/normal Other: None IMPRESSION: No evidence of progressive hemorrhage. Intraparenchymal hemorrhage in the pons and left midbrain with surrounding edema. Small amount of  intraventricular blood in the fourth ventricle. Question slight increase in size of the temporal horns of the lateral ventricles. Electronically Signed   By: Nelson Chimes M.D.   On: 08/02/2021 15:32   CT HEAD WO CONTRAST (5MM)  Result Date: 07/21/2021 CLINICAL DATA:  55 year old female status cardiac arrest and CPR. EXAM: CT HEAD WITHOUT CONTRAST TECHNIQUE: Contiguous axial images were obtained from the base of the skull through the vertex without intravenous contrast. COMPARISON:  Head CT 01/22/2021. brain MRI 01/22/2021. FINDINGS: Brain: Relatively large and lobulated acute brainstem hemorrhage. Hyperdense blood products encompass 39 x 26 x 32 mm (AP by transverse by CC) - 16 mL centered at the mid pons and  tracking both cephalad into the left midbrain and laterally toward the left cerebellar peduncle. This is distinct from a much smaller right ventral pontine hemorrhage seen in April. There is generalized pontine edema. Mildly effaced basilar cisterns. Cisterna magna remains patent. No convincing intraventricular or extra-axial extension of blood at this time. No ventriculomegaly. No superimposed acute cortically based infarct identified. There is scattered supratentorial white matter hypodensity which is chronic and stable. Vascular: Mild Calcified atherosclerosis at the skull base. No suspicious intracranial vascular hyperdensity. Skull: No acute osseous abnormality identified. Chronic bilateral lamina papyracea fractures. Sinuses/Orbits: Intubated on the scout view. Fluid in the visible pharynx. Chronic lamina papyracea fractures. Mild paranasal sinus fluid and/or mucosal thickening now. Tympanic cavities and mastoids remain clear. Other: No acute orbit or scalp soft tissue finding. Chronic right posterior convexity scalp lipoma. IMPRESSION: 1. Primary Acute Brainstem Hemorrhage, with a relatively large 16 mL volume of hyperdense blood within the pons, tracking cephalad into the left midbrain and laterally  towards the left cerebellar peduncle. Pontine edema. 2. No intraventricular or extra-axial extension. No ventriculomegaly. And basilar cisterns remain patent at this time. 3. 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 Critical Value/emergent results were called by telephone at the time of interpretation on 07/08/2021 at 6:16 am to Dr. Shirlyn Goltz , who verbally acknowledged these results. Electronically Signed   By: Genevie Ann M.D.   On: 07/23/2021 06:20   DG Chest Port 1 View  Result Date: 07/15/2021 CLINICAL DATA:  Acute respiratory failure with hypoxia EXAM: PORTABLE CHEST 1 VIEW COMPARISON:  07/05/2021 FINDINGS: Tracheostomy tube tip is above the carina. NG tube and side port are below the GE junction. Stable cardiomediastinal contours. Diffuse pulmonary vascular congestion noted. No signs of pleural effusion or edema. Left upper lobe perihilar atelectasis is improved in the interval. IMPRESSION: 1. Improved aeration to the left upper lobe. 2. Pulmonary vascular congestion. Electronically Signed   By: Kerby Moors M.D.   On: 07/15/2021 07:32   DG Chest Port 1 View  Result Date: 07/30/2021 CLINICAL DATA:  Acute respiratory failure, COVID positive. EXAM: PORTABLE CHEST 1 VIEW COMPARISON:  July 06, 2021 (11:10 a.m.) FINDINGS: There is stable endotracheal tube and nasogastric tube positioning. Mild left suprahilar atelectasis and/or infiltrate is noted. This is mildly increased in severity when compared to the prior study. There is no evidence of a pleural effusion or pneumothorax. The cardiac silhouette is mildly enlarged. Stable prominence of the superior mediastinum is noted with irregular borders along the expected region of the aortic arch. The visualized skeletal structures are unremarkable. IMPRESSION: 1. Mild left suprahilar atelectasis and/or infiltrate, increased in  severity when compared to the prior study. 2. Persistent abnormal upper mediastinal contours. Further evaluation with chest CT is recommended. Electronically Signed   By: Virgina Norfolk M.D.   On: 07/28/2021 22:39   DG CHEST PORT 1 VIEW  Result Date: 08/02/2021 CLINICAL DATA:  Line placement, ETT, COVID positive EXAM: PORTABLE CHEST 1 VIEW COMPARISON:  Chest radiograph obtained earlier the same day FINDINGS: The endotracheal tube is in the midthoracic trachea approximately 3.3 cm from the carina. The cardiac silhouette is stable. The upper mediastinum appears widened, similar to the most recent prior study but increased compared to a more remote study from 2018. There is increased vascular congestion. There is no new focal consolidation. There is no pleural effusion. There is no pneumothorax.  The bones are stable. IMPRESSION: 1. Unchanged abnormal upper mediastinal contours. CT chest is recommended for better evaluation. 2. Increased vascular congestion without overt pulmonary edema. Electronically Signed   By: Valetta Mole M.D.   On: 07/13/2021 11:39   DG Chest Port 1 View  Result Date: 07/21/2021 CLINICAL DATA:  55 year old female with brainstem hemorrhage, status post CPR. EXAM: PORTABLE CHEST 1 VIEW COMPARISON:  Chest radiographs 02/06/2017 and earlier. FINDINGS: Portable AP supine view at 0555 hours. Endotracheal tube tip in good position between the clavicles and carina. Enteric tube courses to the abdomen, tip not included. Indistinct superior mediastinal contour, a including in the aortic arch region. Cardiac size appears stable and within normal limits. No pneumothorax, pulmonary edema or consolidation evident on this supine view. There is hypo ventilation at the left lung base. Mildly gas distended stomach. No acute osseous abnormality identified. IMPRESSION: 1. Satisfactory ET tube and visible enteric tube. 2. Indistinct superior mediastinal contours, nonspecific. Unclear whether this is  related to bilateral superior perihilar lung opacity (such as due to aspiration in this setting), or an abnormal mediastinum. Chest CT would best evaluate further. 3. Left lung base atelectasis suspected. Electronically Signed   By: Genevie Ann M.D.   On: 07/11/2021 07:02   [place] EKG SITE RITE  Result Date: 07/26/2021 If Site Rite image not attached, placement could not be confirmed due to current cardiac rhythm.    PHYSICAL EXAM  Temp:  [99.5 F (37.5 C)] 99.5 F (37.5 C) (10/17 1619) Pulse Rate:  [62-90] 90 (10/17 1619) Resp:  [9-60] 60 (10/17 1619) BP: (112-132)/(67-71) 112/68 (10/17 1619) SpO2:  [47 %-80 %] 47 % (10/17 1400)  General - Well nourished, well developed, comfortable.  Ophthalmologic - fundi not visualized due to noncooperation.  Cardiovascular - Regular rate and rhythm.  Neuro - limited exam due to comfort care measure. Pt lying in bed, eyes closed, not open on voice. Not in distress. No spontaneous movement.   ASSESSMENT/PLAN Ms. LIYAH BURKER is a 55 y.o. 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 No tPA given due to [place].    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  CT showed right small pontine ICH.   EF 60 to 65%.  LDL 69, A1c 5.3.  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 at baseline  Hypertensive emergency Was on hydralazine 75 Q8, and amlodipine 10, clonidine 0.3 tid, lsosobide 40 tid Now on comfort care measures  Respiratory failure Possible aspirin pneumonia Intubated for airway protection -> terminally extubated Leukocytosis WBC 11.6->13.6->10.0 Off Abx  AKI on CKD Creatinine 3.86->5.00->5.31->5.27->5.42 Now on comfort care measures  COVID PCR positive Off isolation  Tobacco abuse Smoker PTA  Other Stroke Risk Factors History of THC use, UDS negative this time  Other Active Problems  Hospital day # 67  I had long discussion with mom, son and other family members at bedside, updated pt current condition, treatment plan and potential prognosis, and answered all the questions. They expressed understanding and appreciation.   Rosalin Hawking, MD PhD Stroke Neurology 07/19/2021 11:23 PM    To contact Stroke Continuity provider, please refer to [email or web address] After hours, contact General Neurology

## 2021-07-19 NOTE — Progress Notes (Addendum)
Pt transferred to [place] room 20. Report given to [place], Therapist, sports.

## 2021-07-19 NOTE — Progress Notes (Signed)
Pt looks comfortable, no distress noted. Updated child Jaquis at bedside.

## 2021-07-20 MED ORDER — KETOROLAC TROMETHAMINE 15 MG/ML IJ SOLN
15.0000 mg | Freq: Four times a day (QID) | INTRAMUSCULAR | Status: DC | PRN
  Administered 2021-07-20: 15 mg via INTRAVENOUS
  Filled 2021-07-20: qty 1

## 2021-08-03 NOTE — Progress Notes (Addendum)
Pt deceased at 5:38 am Palliative medicine team called left message. Morphine 138m ivpb wasted NConsecowitnessed

## 2021-08-03 NOTE — Death Summary Note (Signed)
DEATH SUMMARY   Patient Details  Name: Robin Navarro MRN: [id number] DOB: 1966/04/19  Admission/Discharge Information   Admit Date:  07/13/2021  Date of Death: Date of Death: 07/27/2021  Time of Death: Time of Death: 0538  Length of Stay: 12-22-2022  Referring Physician: [name], MD   Reason(s) for Hospitalization  Brainstem hemorrhage  Diagnoses  Preliminary cause of death:  Brainstem hemorrhage  Secondary Diagnoses (including complications and co-morbidities):    Acute respiratory failure ([place])   COVID-19 virus infection   Hypertensive emergency   Cerebral edema   History of ICH   AKI on CKD   Tobacco abuse   Brief Hospital Course (including significant findings, care, treatment, and services provided and events leading to death)  Robin Navarro is a 55 y.o. 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 No tPA given due to [place].     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 CT head large pontine and midbrain ICH with small IVH CT repeat x2 stable hematoma, questionable slight increase ventricular size 2D Echo EF 60 to 65% in 01/2021 LDL 50 HgbA1c 5.1 UDS negative SCDs for VTE prophylaxis No antithrombotic prior to admission, now on No antithrombotic due to [place] On comfort care per family request, pass expired 2023/07/28 5:38am   Cerebral edema CT showed focal mass-effect at the brainstem On 3% saline @ 50 ->  off Sodium 143->148->152->151->159->161->154->152 Was on FW 350 Q6h   History of ICH 01/2021 admitted for headache and slurred speech as well as hypertensive emergency.  CT showed right small pontine ICH.  EF 60 to 65%.  LDL 69, A1c 5.3.  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 at baseline   Hypertensive emergency Was on hydralazine 75 Q8, and amlodipine 10, clonidine 0.3 tid,  lsosobide 40 tid Off cleviprex   Respiratory failure Possible aspirin pneumonia Intubated for airway protection -> terminally extubated Leukocytosis WBC 11.6->13.6->10.0 Off Abx   AKI on CKD Creatinine 3.86->5.00->5.31->5.27->5.42   COVID PCR positive Off isolation   Tobacco abuse Smoker PTA   Other Stroke Risk Factors History of THC use, UDS negative this time   Other Active Problems     Pertinent Labs and Studies  Significant Diagnostic Studies CT HEAD WO CONTRAST (5MM)  Result Date: 07/10/2021 CLINICAL DATA:  Neuro deficit, acute, stroke suspected. EXAM: CT HEAD WITHOUT CONTRAST TECHNIQUE: Contiguous axial images were obtained from the base of the skull through the vertex without intravenous contrast. COMPARISON:  Prior head CT examinations 07-13-21 and earlier. FINDINGS: Brain: Cerebral volume is normal. A an acute parenchymal hemorrhage within the pons and midbrain has not significantly changed in size as compared to the prior head CT of 07-13-2021. Unchanged surrounding edema and mass effect with partial effacement the fourth ventricle. As before, there is small volume extension of hemorrhage into the fourth ventricle. No evidence of hydrocephalus at this time. Mild-to-moderate patchy and ill-defined hypoattenuation within the cerebral white matter, nonspecific but compatible with chronic small vessel ischemic disease. Vascular: No hyperdense vessel.  Atherosclerotic calcifications. Skull: Normal. Negative for fracture or focal lesion. Sinuses/Orbits: Visualized orbits show no acute finding. Chronic medially displaced fracture deformities of the lamina papyracea bilaterally. Scattered mild paranasal sinus mucosal thickening. Small volume frothy secretions within the right sphenoid sinus. Other: Trace fluid within the bilateral mastoid air cells. IMPRESSION: Acute parenchymal hemorrhage within the pons and  midbrain, not significant changed as compared to the head CT of  07/13/2021. Unchanged surrounding edema and mass effect with partial effacement of the fourth ventricle. As before, there is probable small-volume extension of hemorrhage into the fourth ventricle. No evidence of hydrocephalus at this time. Stable mild-to-moderate cerebral white matter chronic small vessel ischemic disease. Paranasal sinus disease, as described. Electronically Signed   By: Kellie Simmering D.O.   On: 07/10/2021 12:32   CT HEAD WO CONTRAST (5MM)  Result Date: 07/29/2021 CLINICAL DATA:  Stroke.  Follow-up. EXAM: CT HEAD WITHOUT CONTRAST TECHNIQUE: Contiguous axial images were obtained from the base of the skull through the vertex without intravenous contrast. COMPARISON:  Head CT earlier same day. FINDINGS: Brain: Intraparenchymal hemorrhage within the pons and left midbrain appears similar. The density is becoming less distinct. Approximate size of the hematoma remains 3.5 cm. Edema is seen throughout the pons and midbrain. Small amount of fourth ventricular blood as seen previously. No evidence of additional bleeding since the prior exam. Question if there could be slight increase in fullness of the temporal horns of the lateral ventricles that could be early evidence of hydrocephalus. Otherwise, there are mild chronic small-vessel ischemic changes of the cerebral hemispheric white matter. Vascular: No new vascular finding. Skull: Negative Sinuses/Orbits: Clear/normal Other: None IMPRESSION: No evidence of progressive hemorrhage. Intraparenchymal hemorrhage in the pons and left midbrain with surrounding edema. Small amount of intraventricular blood in the fourth ventricle. Question slight increase in size of the temporal horns of the lateral ventricles. Electronically Signed   By: Nelson Chimes M.D.   On: 07/09/2021 15:32   CT HEAD WO CONTRAST (5MM)  Result Date: 07/18/2021 CLINICAL DATA:  55 year old female status cardiac arrest and CPR. EXAM: CT HEAD WITHOUT CONTRAST TECHNIQUE: Contiguous axial  images were obtained from the base of the skull through the vertex without intravenous contrast. COMPARISON:  Head CT 01/22/2021. brain MRI 01/22/2021. FINDINGS: Brain: Relatively large and lobulated acute brainstem hemorrhage. Hyperdense blood products encompass 39 x 26 x 32 mm (AP by transverse by CC) - 16 mL centered at the mid pons and tracking both cephalad into the left midbrain and laterally toward the left cerebellar peduncle. This is distinct from a much smaller right ventral pontine hemorrhage seen in April. There is generalized pontine edema. Mildly effaced basilar cisterns. Cisterna magna remains patent. No convincing intraventricular or extra-axial extension of blood at this time. No ventriculomegaly. No superimposed acute cortically based infarct identified. There is scattered supratentorial white matter hypodensity which is chronic and stable. Vascular: Mild Calcified atherosclerosis at the skull base. No suspicious intracranial vascular hyperdensity. Skull: No acute osseous abnormality identified. Chronic bilateral lamina papyracea fractures. Sinuses/Orbits: Intubated on the scout view. Fluid in the visible pharynx. Chronic lamina papyracea fractures. Mild paranasal sinus fluid and/or mucosal thickening now. Tympanic cavities and mastoids remain clear. Other: No acute orbit or scalp soft tissue finding. Chronic right posterior convexity scalp lipoma. IMPRESSION: 1. Primary Acute Brainstem Hemorrhage, with a relatively large 16 mL volume of hyperdense blood within the pons, tracking cephalad into the left midbrain and laterally towards the left cerebellar peduncle. Pontine edema. 2. No intraventricular or extra-axial extension. No ventriculomegaly. And basilar cisterns remain patent at this time. 3. 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 Critical Value/emergent  results were called by telephone at the time of interpretation on 07/05/2021 at 6:16  am to Dr. Shirlyn Goltz , who verbally acknowledged these results. Electronically Signed   By: Genevie Ann M.D.   On: 07/14/2021 06:20   DG Chest Port 1 View  Result Date: 07/15/2021 CLINICAL DATA:  Acute respiratory failure with hypoxia EXAM: PORTABLE CHEST 1 VIEW COMPARISON:  07/09/2021 FINDINGS: Tracheostomy tube tip is above the carina. NG tube and side port are below the GE junction. Stable cardiomediastinal contours. Diffuse pulmonary vascular congestion noted. No signs of pleural effusion or edema. Left upper lobe perihilar atelectasis is improved in the interval. IMPRESSION: 1. Improved aeration to the left upper lobe. 2. Pulmonary vascular congestion. Electronically Signed   By: Kerby Moors M.D.   On: 07/15/2021 07:32   DG Chest Port 1 View  Result Date: 08/02/2021 CLINICAL DATA:  Acute respiratory failure, COVID positive. EXAM: PORTABLE CHEST 1 VIEW COMPARISON:  July 06, 2021 (11:10 a.m.) FINDINGS: There is stable endotracheal tube and nasogastric tube positioning. Mild left suprahilar atelectasis and/or infiltrate is noted. This is mildly increased in severity when compared to the prior study. There is no evidence of a pleural effusion or pneumothorax. The cardiac silhouette is mildly enlarged. Stable prominence of the superior mediastinum is noted with irregular borders along the expected region of the aortic arch. The visualized skeletal structures are unremarkable. IMPRESSION: 1. Mild left suprahilar atelectasis and/or infiltrate, increased in severity when compared to the prior study. 2. Persistent abnormal upper mediastinal contours. Further evaluation with chest CT is recommended. Electronically Signed   By: Virgina Norfolk M.D.   On: 07/05/2021 22:39   DG CHEST PORT 1 VIEW  Result Date: 07/31/2021 CLINICAL DATA:  Line placement, ETT, COVID positive EXAM: PORTABLE CHEST 1 VIEW COMPARISON:  Chest  radiograph obtained earlier the same day FINDINGS: The endotracheal tube is in the midthoracic trachea approximately 3.3 cm from the carina. The cardiac silhouette is stable. The upper mediastinum appears widened, similar to the most recent prior study but increased compared to a more remote study from 2018. There is increased vascular congestion. There is no new focal consolidation. There is no pleural effusion. There is no pneumothorax. The bones are stable. IMPRESSION: 1. Unchanged abnormal upper mediastinal contours. CT chest is recommended for better evaluation. 2. Increased vascular congestion without overt pulmonary edema. Electronically Signed   By: Valetta Mole M.D.   On: 07/05/2021 11:39   DG Chest Port 1 View  Result Date: 08/01/2021 CLINICAL DATA:  55 year old female with brainstem hemorrhage, status post CPR. EXAM: PORTABLE CHEST 1 VIEW COMPARISON:  Chest radiographs 02/06/2017 and earlier. FINDINGS: Portable AP supine view at 0555 hours. Endotracheal tube tip in good position between the clavicles and carina. Enteric tube courses to the abdomen, tip not included. Indistinct superior mediastinal contour, a including in the aortic arch region. Cardiac size appears stable and within normal limits. No pneumothorax, pulmonary edema or consolidation evident on this supine view. There is hypo ventilation at the left lung base. Mildly gas distended stomach. No acute osseous abnormality identified. IMPRESSION: 1. Satisfactory ET tube and visible enteric tube. 2. Indistinct superior mediastinal contours, nonspecific. Unclear whether this is related to bilateral superior perihilar lung opacity (such as due to aspiration in this setting), or an abnormal mediastinum. Chest CT would best evaluate further. 3. Left lung base atelectasis suspected. Electronically Signed   By: Genevie Ann M.D.   On: 07/09/2021 07:02   [place] EKG SITE RITE  Result Date: 07/05/2021 If Site Rite image not attached, placement could not be  confirmed  due to current cardiac rhythm.   Microbiology No results found for this or any previous visit (from the past 240 hour(s)).  Lab Basic Metabolic Panel: Recent Labs  Lab 07/14/21 0322 07/14/21 0945 07/15/21 0415 07/15/21 0945 07/16/21 0459 07/16/21 1103 07/16/21 1526 07/16/21 2100 07/17/21 0445 07/17/21 1008  NA 155*   < > 156*   < > 152* 154* 152* 154* 150* 154*  K 4.8  --  4.6  --  4.3  --   --   --  4.9  --   CL 126*  --  123*  --  121*  --   --   --  120*  --   CO2 18*  --  21*  --  21*  --   --   --  21*  --   GLUCOSE 121*  --  98  --  147*  --   --   --  126*  --   BUN 100*  --  100*  --  107*  --   --   --  109*  --   CREATININE 5.62*  --  5.21*  --  5.34*  --   --   --  5.42*  --   CALCIUM 8.7*  --  9.2  --  9.2  --   --   --  8.9  --   MG  --   --  2.8*  --   --   --   --   --  2.9*  --    < > = values in this interval not displayed.   Liver Function Tests: No results for input(s): AST, ALT, ALKPHOS, BILITOT, PROT, ALBUMIN in the last 168 hours. No results for input(s): LIPASE, AMYLASE in the last 168 hours. No results for input(s): AMMONIA in the last 168 hours. CBC: Recent Labs  Lab 07/14/21 0945 07/15/21 0415 07/16/21 0459 07/17/21 0445  WBC 8.3 9.3 9.8 10.0  HGB 9.3* 9.4* 9.7* 8.9*  HCT 29.8* 30.3* 31.1* 28.5*  MCV 92.5 92.9 93.1 93.4  PLT 158 166 177 130*   Cardiac Enzymes: No results for input(s): CKTOTAL, CKMB, CKMBINDEX, TROPONINI in the last 168 hours. Sepsis Labs: Recent Labs  Lab 07/14/21 0945 07/15/21 0415 07/16/21 0459 07/17/21 0445  WBC 8.3 9.3 9.8 10.0    Procedures/Operations  Intubation and extubation   Rosalin Hawking 19-Aug-2021, 6:44 PM

## 2021-08-03 NOTE — Progress Notes (Signed)
Dr Theda Sers informed of pt expired 5:38 am

## 2021-08-03 DEATH — deceased
